# Patient Record
Sex: Male | Born: 1945 | Race: White | Hispanic: No | Marital: Married | Smoking: Former smoker
Health system: Southern US, Community
[De-identification: ages and names within clinical notes are randomized; demographics above are authoritative.]

## PROBLEM LIST (undated history)

## (undated) DIAGNOSIS — I209 Angina pectoris, unspecified: Secondary | ICD-10-CM

## (undated) DIAGNOSIS — K219 Gastro-esophageal reflux disease without esophagitis: Secondary | ICD-10-CM

## (undated) DIAGNOSIS — N185 Chronic kidney disease, stage 5: Secondary | ICD-10-CM

## (undated) DIAGNOSIS — R519 Headache, unspecified: Secondary | ICD-10-CM

## (undated) DIAGNOSIS — M109 Gout, unspecified: Secondary | ICD-10-CM

## (undated) DIAGNOSIS — G43909 Migraine, unspecified, not intractable, without status migrainosus: Secondary | ICD-10-CM

## (undated) DIAGNOSIS — Z515 Encounter for palliative care: Secondary | ICD-10-CM

## (undated) DIAGNOSIS — N184 Chronic kidney disease, stage 4 (severe): Secondary | ICD-10-CM

## (undated) DIAGNOSIS — N400 Enlarged prostate without lower urinary tract symptoms: Secondary | ICD-10-CM

## (undated) DIAGNOSIS — M1990 Unspecified osteoarthritis, unspecified site: Secondary | ICD-10-CM

## (undated) DIAGNOSIS — I2510 Atherosclerotic heart disease of native coronary artery without angina pectoris: Secondary | ICD-10-CM

## (undated) DIAGNOSIS — E785 Hyperlipidemia, unspecified: Secondary | ICD-10-CM

## (undated) DIAGNOSIS — G309 Alzheimer's disease, unspecified: Secondary | ICD-10-CM

## (undated) DIAGNOSIS — I219 Acute myocardial infarction, unspecified: Secondary | ICD-10-CM

## (undated) DIAGNOSIS — I10 Essential (primary) hypertension: Secondary | ICD-10-CM

## (undated) DIAGNOSIS — F0280 Dementia in other diseases classified elsewhere without behavioral disturbance: Secondary | ICD-10-CM

## (undated) HISTORY — PX: COLONOSCOPY: SHX174

## (undated) HISTORY — PX: CARDIAC CATHETERIZATION: SHX172

## (undated) HISTORY — PX: HERNIA REPAIR: SHX51

## (undated) HISTORY — PX: EYE SURGERY: SHX253

---

## 2004-01-13 ENCOUNTER — Other Ambulatory Visit: Payer: Self-pay

## 2009-10-04 ENCOUNTER — Emergency Department: Payer: Self-pay | Admitting: Emergency Medicine

## 2011-03-08 ENCOUNTER — Emergency Department: Payer: Self-pay | Admitting: *Deleted

## 2013-10-01 DIAGNOSIS — M1990 Unspecified osteoarthritis, unspecified site: Secondary | ICD-10-CM | POA: Insufficient documentation

## 2013-10-01 DIAGNOSIS — R809 Proteinuria, unspecified: Secondary | ICD-10-CM | POA: Insufficient documentation

## 2013-11-21 ENCOUNTER — Ambulatory Visit: Payer: Self-pay | Admitting: Gastroenterology

## 2013-11-24 LAB — PATHOLOGY REPORT

## 2014-07-06 DIAGNOSIS — E785 Hyperlipidemia, unspecified: Secondary | ICD-10-CM | POA: Insufficient documentation

## 2014-07-06 DIAGNOSIS — E782 Mixed hyperlipidemia: Secondary | ICD-10-CM | POA: Insufficient documentation

## 2014-07-22 ENCOUNTER — Ambulatory Visit: Payer: Self-pay | Admitting: Internal Medicine

## 2014-08-21 ENCOUNTER — Ambulatory Visit
Admit: 2014-08-21 | Disposition: A | Discharge status: Home / Self Care | Payer: Self-pay | Attending: Nephrology | Admitting: Nephrology

## 2015-05-31 DIAGNOSIS — I10 Essential (primary) hypertension: Secondary | ICD-10-CM | POA: Diagnosis not present

## 2015-05-31 DIAGNOSIS — M545 Low back pain: Secondary | ICD-10-CM | POA: Diagnosis not present

## 2015-05-31 DIAGNOSIS — E782 Mixed hyperlipidemia: Secondary | ICD-10-CM | POA: Diagnosis not present

## 2015-07-06 DIAGNOSIS — M545 Low back pain: Secondary | ICD-10-CM | POA: Diagnosis not present

## 2015-07-06 DIAGNOSIS — Z125 Encounter for screening for malignant neoplasm of prostate: Secondary | ICD-10-CM | POA: Diagnosis not present

## 2015-07-06 DIAGNOSIS — E782 Mixed hyperlipidemia: Secondary | ICD-10-CM | POA: Diagnosis not present

## 2015-07-13 DIAGNOSIS — I10 Essential (primary) hypertension: Secondary | ICD-10-CM | POA: Diagnosis not present

## 2015-07-13 DIAGNOSIS — Z0000 Encounter for general adult medical examination without abnormal findings: Secondary | ICD-10-CM | POA: Diagnosis not present

## 2015-07-13 DIAGNOSIS — N184 Chronic kidney disease, stage 4 (severe): Secondary | ICD-10-CM | POA: Diagnosis not present

## 2015-11-12 DIAGNOSIS — R801 Persistent proteinuria, unspecified: Secondary | ICD-10-CM | POA: Diagnosis not present

## 2015-11-12 DIAGNOSIS — I10 Essential (primary) hypertension: Secondary | ICD-10-CM | POA: Diagnosis not present

## 2015-11-12 DIAGNOSIS — N184 Chronic kidney disease, stage 4 (severe): Secondary | ICD-10-CM | POA: Diagnosis not present

## 2015-12-31 DIAGNOSIS — L03119 Cellulitis of unspecified part of limb: Secondary | ICD-10-CM | POA: Diagnosis not present

## 2015-12-31 DIAGNOSIS — Z91038 Other insect allergy status: Secondary | ICD-10-CM | POA: Diagnosis not present

## 2016-05-02 DIAGNOSIS — M5430 Sciatica, unspecified side: Secondary | ICD-10-CM | POA: Diagnosis not present

## 2016-05-02 DIAGNOSIS — J40 Bronchitis, not specified as acute or chronic: Secondary | ICD-10-CM | POA: Diagnosis not present

## 2016-05-02 DIAGNOSIS — J4522 Mild intermittent asthma with status asthmaticus: Secondary | ICD-10-CM | POA: Diagnosis not present

## 2016-05-26 DIAGNOSIS — M542 Cervicalgia: Secondary | ICD-10-CM | POA: Diagnosis not present

## 2016-05-26 DIAGNOSIS — I10 Essential (primary) hypertension: Secondary | ICD-10-CM | POA: Insufficient documentation

## 2016-06-09 DIAGNOSIS — I10 Essential (primary) hypertension: Secondary | ICD-10-CM | POA: Diagnosis not present

## 2016-06-09 DIAGNOSIS — M542 Cervicalgia: Secondary | ICD-10-CM | POA: Diagnosis not present

## 2016-07-03 DIAGNOSIS — Z9841 Cataract extraction status, right eye: Secondary | ICD-10-CM | POA: Diagnosis not present

## 2016-07-03 DIAGNOSIS — H15111 Episcleritis periodica fugax, right eye: Secondary | ICD-10-CM | POA: Diagnosis not present

## 2016-07-03 DIAGNOSIS — D3132 Benign neoplasm of left choroid: Secondary | ICD-10-CM | POA: Diagnosis not present

## 2016-07-03 DIAGNOSIS — Z9842 Cataract extraction status, left eye: Secondary | ICD-10-CM | POA: Diagnosis not present

## 2016-07-03 DIAGNOSIS — H35361 Drusen (degenerative) of macula, right eye: Secondary | ICD-10-CM | POA: Diagnosis not present

## 2016-07-11 DIAGNOSIS — Z125 Encounter for screening for malignant neoplasm of prostate: Secondary | ICD-10-CM | POA: Diagnosis not present

## 2016-07-11 DIAGNOSIS — N184 Chronic kidney disease, stage 4 (severe): Secondary | ICD-10-CM | POA: Diagnosis not present

## 2016-07-17 DIAGNOSIS — N184 Chronic kidney disease, stage 4 (severe): Secondary | ICD-10-CM | POA: Diagnosis not present

## 2016-07-17 DIAGNOSIS — Z0000 Encounter for general adult medical examination without abnormal findings: Secondary | ICD-10-CM | POA: Insufficient documentation

## 2017-01-12 DIAGNOSIS — N184 Chronic kidney disease, stage 4 (severe): Secondary | ICD-10-CM | POA: Diagnosis not present

## 2017-01-16 DIAGNOSIS — N184 Chronic kidney disease, stage 4 (severe): Secondary | ICD-10-CM | POA: Diagnosis not present

## 2017-01-16 DIAGNOSIS — Z79899 Other long term (current) drug therapy: Secondary | ICD-10-CM | POA: Diagnosis not present

## 2017-01-16 DIAGNOSIS — E782 Mixed hyperlipidemia: Secondary | ICD-10-CM | POA: Diagnosis not present

## 2017-01-16 DIAGNOSIS — N060 Isolated proteinuria with minor glomerular abnormality: Secondary | ICD-10-CM | POA: Diagnosis not present

## 2017-01-16 DIAGNOSIS — Z125 Encounter for screening for malignant neoplasm of prostate: Secondary | ICD-10-CM | POA: Diagnosis not present

## 2017-03-02 ENCOUNTER — Other Ambulatory Visit: Payer: Self-pay | Admitting: Internal Medicine

## 2017-03-02 DIAGNOSIS — R7989 Other specified abnormal findings of blood chemistry: Secondary | ICD-10-CM | POA: Diagnosis not present

## 2017-03-02 DIAGNOSIS — G9349 Other encephalopathy: Secondary | ICD-10-CM

## 2017-03-02 DIAGNOSIS — E538 Deficiency of other specified B group vitamins: Secondary | ICD-10-CM | POA: Diagnosis not present

## 2017-03-02 DIAGNOSIS — E039 Hypothyroidism, unspecified: Secondary | ICD-10-CM | POA: Diagnosis not present

## 2017-03-09 ENCOUNTER — Ambulatory Visit
Admission: RE | Admit: 2017-03-09 | Discharge: 2017-03-09 | Disposition: A | Discharge status: Home / Self Care | Payer: PPO | Source: Ambulatory Visit | Attending: Internal Medicine | Admitting: Internal Medicine

## 2017-03-09 DIAGNOSIS — G9349 Other encephalopathy: Secondary | ICD-10-CM | POA: Diagnosis not present

## 2017-03-09 DIAGNOSIS — R413 Other amnesia: Secondary | ICD-10-CM | POA: Diagnosis not present

## 2017-03-20 DIAGNOSIS — N184 Chronic kidney disease, stage 4 (severe): Secondary | ICD-10-CM | POA: Diagnosis not present

## 2017-03-23 DIAGNOSIS — N184 Chronic kidney disease, stage 4 (severe): Secondary | ICD-10-CM | POA: Diagnosis not present

## 2017-03-26 ENCOUNTER — Other Ambulatory Visit: Payer: Self-pay | Admitting: Internal Medicine

## 2017-03-26 DIAGNOSIS — N184 Chronic kidney disease, stage 4 (severe): Secondary | ICD-10-CM

## 2017-03-30 ENCOUNTER — Ambulatory Visit
Admission: RE | Admit: 2017-03-30 | Discharge: 2017-03-30 | Disposition: A | Discharge status: Home / Self Care | Payer: PPO | Source: Ambulatory Visit | Attending: Internal Medicine | Admitting: Internal Medicine

## 2017-03-30 DIAGNOSIS — N184 Chronic kidney disease, stage 4 (severe): Secondary | ICD-10-CM | POA: Insufficient documentation

## 2017-03-30 DIAGNOSIS — N281 Cyst of kidney, acquired: Secondary | ICD-10-CM | POA: Diagnosis not present

## 2017-04-23 DIAGNOSIS — N184 Chronic kidney disease, stage 4 (severe): Secondary | ICD-10-CM | POA: Diagnosis not present

## 2017-05-06 ENCOUNTER — Encounter: Payer: Self-pay | Admitting: Emergency Medicine

## 2017-05-06 ENCOUNTER — Observation Stay
Admission: EM | Admit: 2017-05-06 | Discharge: 2017-05-07 | Disposition: A | Discharge status: Home / Self Care | Payer: PPO | Attending: Internal Medicine | Admitting: Internal Medicine

## 2017-05-06 ENCOUNTER — Other Ambulatory Visit: Payer: Self-pay

## 2017-05-06 ENCOUNTER — Emergency Department: Payer: PPO

## 2017-05-06 DIAGNOSIS — R079 Chest pain, unspecified: Secondary | ICD-10-CM | POA: Diagnosis not present

## 2017-05-06 DIAGNOSIS — I129 Hypertensive chronic kidney disease with stage 1 through stage 4 chronic kidney disease, or unspecified chronic kidney disease: Secondary | ICD-10-CM | POA: Insufficient documentation

## 2017-05-06 DIAGNOSIS — I10 Essential (primary) hypertension: Secondary | ICD-10-CM | POA: Diagnosis present

## 2017-05-06 DIAGNOSIS — G309 Alzheimer's disease, unspecified: Secondary | ICD-10-CM | POA: Diagnosis not present

## 2017-05-06 DIAGNOSIS — Z79899 Other long term (current) drug therapy: Secondary | ICD-10-CM | POA: Insufficient documentation

## 2017-05-06 DIAGNOSIS — R748 Abnormal levels of other serum enzymes: Secondary | ICD-10-CM | POA: Insufficient documentation

## 2017-05-06 DIAGNOSIS — N184 Chronic kidney disease, stage 4 (severe): Secondary | ICD-10-CM | POA: Diagnosis not present

## 2017-05-06 DIAGNOSIS — K219 Gastro-esophageal reflux disease without esophagitis: Secondary | ICD-10-CM | POA: Diagnosis present

## 2017-05-06 DIAGNOSIS — N186 End stage renal disease: Secondary | ICD-10-CM | POA: Diagnosis present

## 2017-05-06 DIAGNOSIS — R7989 Other specified abnormal findings of blood chemistry: Secondary | ICD-10-CM | POA: Diagnosis not present

## 2017-05-06 DIAGNOSIS — Z87891 Personal history of nicotine dependence: Secondary | ICD-10-CM | POA: Diagnosis not present

## 2017-05-06 DIAGNOSIS — R072 Precordial pain: Principal | ICD-10-CM | POA: Insufficient documentation

## 2017-05-06 DIAGNOSIS — F0280 Dementia in other diseases classified elsewhere without behavioral disturbance: Secondary | ICD-10-CM | POA: Diagnosis present

## 2017-05-06 DIAGNOSIS — R778 Other specified abnormalities of plasma proteins: Secondary | ICD-10-CM

## 2017-05-06 HISTORY — DX: Essential (primary) hypertension: I10

## 2017-05-06 HISTORY — DX: Alzheimer's disease, unspecified: G30.9

## 2017-05-06 HISTORY — DX: Dementia in other diseases classified elsewhere, unspecified severity, without behavioral disturbance, psychotic disturbance, mood disturbance, and anxiety: F02.80

## 2017-05-06 HISTORY — DX: Chronic kidney disease, stage 4 (severe): N18.4

## 2017-05-06 LAB — BASIC METABOLIC PANEL
ANION GAP: 12 (ref 5–15)
BUN: 28 mg/dL — ABNORMAL HIGH (ref 6–20)
CALCIUM: 9.7 mg/dL (ref 8.9–10.3)
CO2: 23 mmol/L (ref 22–32)
Chloride: 106 mmol/L (ref 101–111)
Creatinine, Ser: 2.06 mg/dL — ABNORMAL HIGH (ref 0.61–1.24)
GFR, EST AFRICAN AMERICAN: 35 mL/min — AB (ref >60)
GFR, EST NON AFRICAN AMERICAN: 30 mL/min — AB (ref >60)
Glucose, Bld: 100 mg/dL — ABNORMAL HIGH (ref 65–99)
POTASSIUM: 4.6 mmol/L (ref 3.5–5.1)
SODIUM: 141 mmol/L (ref 135–145)

## 2017-05-06 LAB — TROPONIN I
TROPONIN I: 0.08 ng/mL — AB (ref <0.03)
TROPONIN I: 0.12 ng/mL — AB (ref <0.03)

## 2017-05-06 LAB — CBC
HCT: 38.9 % — ABNORMAL LOW (ref 40.0–52.0)
Hemoglobin: 13.3 g/dL (ref 13.0–18.0)
MCH: 30.6 pg (ref 26.0–34.0)
MCHC: 34.2 g/dL (ref 32.0–36.0)
MCV: 89.3 fL (ref 80.0–100.0)
PLATELETS: 201 10*3/uL (ref 150–440)
RBC: 4.35 MIL/uL — AB (ref 4.40–5.90)
RDW: 12.8 % (ref 11.5–14.5)
WBC: 6 10*3/uL (ref 3.8–10.6)

## 2017-05-06 MED ORDER — MORPHINE SULFATE (PF) 2 MG/ML IV SOLN
2.0000 mg | Freq: Once | INTRAVENOUS | Status: AC
  Administered 2017-05-06: 2 mg via INTRAVENOUS
  Filled 2017-05-06: qty 1

## 2017-05-06 MED ORDER — ONDANSETRON HCL 4 MG/2ML IJ SOLN
4.0000 mg | Freq: Once | INTRAMUSCULAR | Status: AC
  Administered 2017-05-06: 4 mg via INTRAVENOUS
  Filled 2017-05-06: qty 2

## 2017-05-06 MED ORDER — ASPIRIN 81 MG PO CHEW
324.0000 mg | CHEWABLE_TABLET | Freq: Once | ORAL | Status: AC
  Administered 2017-05-06: 324 mg via ORAL
  Filled 2017-05-06: qty 4

## 2017-05-06 NOTE — H&P (Signed)
[name] [place] NAME: Troy Chavez    MR#:  [id number]  DATE OF BIRTH:  10/27/1945  DATE OF ADMISSION:  05/06/2017  PRIMARY CARE PHYSICIAN: Rusty Aus, MD   REQUESTING/REFERRING PHYSICIAN: Cinda Quest, MD  CHIEF COMPLAINT:   Chief Complaint  Patient presents with  . Chest Pain    HISTORY OF PRESENT ILLNESS:  Troy Chavez  is a 71 y.o. male who presents with an episode of chest pain.  Patient states this started shortly after dinnertime, and initially he thought it was indigestion.  He states that he has had recurrent episodes of indigestion in the past.  The pain initially eased, but then came back much stronger than it ever had in the past.  He came to the ED for evaluation.  Here he was found to have troponin elevated at 0.08, and subsequently at 0.12.  At that point hospitalist were called for admission and further evaluation  PAST MEDICAL HISTORY:   Past Medical History:  Diagnosis Date  . Alzheimer's disease   . CKD (chronic kidney disease), stage IV ([place])   . Hypertension     PAST SURGICAL HISTORY:  History reviewed. No pertinent surgical history.  SOCIAL HISTORY:   Social History   Tobacco Use  . Smoking status: Former Research scientist (life sciences)  . Smokeless tobacco: Never Used  Substance Use Topics  . Alcohol use: Yes    FAMILY HISTORY:  History reviewed. No pertinent family history.  DRUG ALLERGIES:  No Known Allergies  MEDICATIONS AT HOME:   Prior to Admission medications   Medication Sig Start Date End Date Taking? Authorizing Provider  amLODipine (NORVASC) 5 MG tablet Take 5 mg by mouth 2 (two) times daily. 03/25/17  Yes [provider]  atorvastatin (LIPITOR) 10 MG tablet Take 10 mg by mouth daily. 03/02/17  Yes [provider]  galantamine (RAZADYNE) 8 MG tablet Take 8 mg by mouth 2 (two) times daily. 03/02/17  Yes [provider]  pantoprazole (PROTONIX) 40 MG tablet Take 40 mg  by mouth daily. 04/06/17  Yes [provider]  tamsulosin (FLOMAX) 0.4 MG CAPS capsule Take 0.4 mg by mouth daily. 05/06/17  Yes [provider]  zolmitriptan (ZOMIG) 5 MG tablet Take 5 mg by mouth once as needed for migraine. If headache returns, may take a second dose after 2 hours.   Yes [provider]    REVIEW OF SYSTEMS:  Review of Systems  Constitutional: Negative for chills, fever, malaise/fatigue and weight loss.  HENT: Negative for ear pain, hearing loss and tinnitus.   Eyes: Negative for blurred vision, double vision, pain and redness.  Respiratory: Negative for cough, hemoptysis and shortness of breath.   Cardiovascular: Positive for chest pain. Negative for palpitations, orthopnea and leg swelling.  Gastrointestinal: Negative for abdominal pain, constipation, diarrhea, nausea and vomiting.  Genitourinary: Negative for dysuria, frequency and hematuria.  Musculoskeletal: Negative for back pain, joint pain and neck pain.  Skin:       No acne, rash, or lesions  Neurological: Negative for dizziness, tremors, focal weakness and weakness.  Endo/Heme/Allergies: Negative for polydipsia. Does not bruise/bleed easily.  Psychiatric/Behavioral: Negative for depression. The patient is not nervous/anxious and does not have insomnia.      VITAL SIGNS:   Vitals:   05/06/17 2030 05/06/17 2100 05/06/17 2130 05/06/17 2200  BP: (!) 147/95 (!) 148/95 (!) 160/107 (!) 153/86  Pulse: 60 (!) 53 67 (!) 54  Resp: 12 11  14 17  Temp:      TempSrc:      SpO2: 94% 97% 96% 94%  Weight:      Height:       Wt Readings from Last 3 Encounters:  05/06/17 83.9 kg (185 lb)    PHYSICAL EXAMINATION:  Physical Exam  Vitals reviewed. Constitutional: He is oriented to person, place, and time. He appears well-developed and well-nourished. No distress.  HENT:  Head: Normocephalic and atraumatic.  Mouth/Throat: Oropharynx is clear and moist.  Eyes: Conjunctivae and EOM are  normal. Pupils are equal, round, and reactive to light. No scleral icterus.  Neck: Normal range of motion. Neck supple. No JVD present. No thyromegaly present.  Cardiovascular: Normal rate, regular rhythm and intact distal pulses. Exam reveals no gallop and no friction rub.  Murmur heard. Respiratory: Effort normal and breath sounds normal. No respiratory distress. He has no wheezes. He has no rales.  GI: Soft. Bowel sounds are normal. He exhibits no distension. There is no tenderness.  Musculoskeletal: Normal range of motion. He exhibits no edema.  No arthritis, no gout  Lymphadenopathy:    He has no cervical adenopathy.  Neurological: He is alert and oriented to person, place, and time. No cranial nerve deficit.  No dysarthria, no aphasia  Skin: Skin is warm and dry. No rash noted. No erythema.  Psychiatric: He has a normal mood and affect. His behavior is normal. Judgment and thought content normal.    LABORATORY PANEL:   CBC Recent Labs  Lab 05/06/17 1906  WBC 6.0  HGB 13.3  HCT 38.9*  PLT 201   ------------------------------------------------------------------------------------------------------------------  Chemistries  Recent Labs  Lab 05/06/17 1842  NA 141  K 4.6  CL 106  CO2 23  GLUCOSE 100*  BUN 28*  CREATININE 2.06*  CALCIUM 9.7   ------------------------------------------------------------------------------------------------------------------  Cardiac Enzymes Recent Labs  Lab 05/06/17 2004  TROPONINI 0.12*   ------------------------------------------------------------------------------------------------------------------  RADIOLOGY:  Dg Chest 2 View  Result Date: 05/06/2017 CLINICAL DATA:  Chest pain radiating to left arm. EXAM: CHEST  2 VIEW COMPARISON:  None. FINDINGS: The heart size and mediastinal contours are within normal limits. Mild tortuosity of thoracic aorta noted. Both lungs are clear. The visualized skeletal structures are unremarkable.  IMPRESSION: No active cardiopulmonary disease. Electronically Signed   By: Earle Gell M.D.   On: 05/06/2017 19:14    EKG:   Orders placed or performed during the hospital encounter of 05/06/17  . EKG 12-Lead  . EKG 12-Lead  . ED EKG within 10 minutes  . ED EKG within 10 minutes    IMPRESSION AND PLAN:  Principal Problem:   Chest pain -trend cardiac enzymes, will start heparin drip if his enzymes rise significantly, get an echocardiogram and a cardiology consult Active Problems:   CKD (chronic kidney disease), stage IV (HCC) -at baseline, avoid nephrotoxins and monitor   HTN (hypertension) -continue home meds   Alzheimer disease -continue home medications   GERD (gastroesophageal reflux disease) -home dose PPI  All the records are reviewed and case discussed with ED provider. Management plans discussed with the patient and/or family.  DVT PROPHYLAXIS: SubQ heparin  GI PROPHYLAXIS: PPI  ADMISSION STATUS: Observation  CODE STATUS: Full Code Status History    This patient does not have a recorded code status. Please follow your organizational policy for patients in this situation.      TOTAL TIME TAKING CARE OF THIS PATIENT: 40 minutes.   Saphyre Cillo FIELDING 05/06/2017, 10:24 PM  [place]  [phone]  CC: Primary care physician; Rusty Aus, MD  Note:  This document was prepared using Dragon voice recognition software and may include unintentional dictation errors.

## 2017-05-06 NOTE — ED Provider Notes (Addendum)
[facility] Emergency Department Provider Note   ____________________________________________   First MD Initiated Contact with Patient 05/06/17 1844     (approximate)  I have reviewed the triage vital signs and the nursing notes.   HISTORY  Chief Complaint Chest Pain    HPI Troy Chavez is a 71 y.o. male Who comes in with his wife. They report chest pain which is pressure-like in the left upper chest radiating into the back and the top of left arm starting about an hour ago. Nothing he does makes it better or worse she's never had it before he may be a little short of breath is not sure is not coughing any patient is wincing in pain seems to be severe pain   Past Medical History:  Diagnosis Date  . Alzheimer's disease   . Hypertension   . Renal disorder     There are no active problems to display for this patient.   History reviewed. No pertinent surgical history.  Prior to Admission medications   Not on File    Allergies Patient has no known allergies.  History reviewed. No pertinent family history.  Social History Social History   Tobacco Use  . Smoking status: Former Research scientist (life sciences)  . Smokeless tobacco: Never Used  Substance Use Topics  . Alcohol use: Yes  . Drug use: No    Review of Systems  Constitutional: No fever/chills Eyes: No visual changes. ENT: No sore throat. Cardiovascular:  chest pain. Respiratory: see history of present illness is no coughing Gastrointestinal: No abdominal pain.  No nausea, no vomiting.  No diarrhea.  No constipation. Genitourinary: Negative for dysuria. Musculoskeletal: Negative for back pain. Skin: Negative for rash. Neurological: Negative for headaches, focal weakness  ____________________________________________   PHYSICAL EXAM:  VITAL SIGNS: ED Triage Vitals [05/06/17 1831]  Enc Vitals Group     BP (!) 180/116     Pulse Rate 69     Resp (!) 26     Temp 97.7 F (36.5 C)     Temp  Source Oral     SpO2 100 %     Weight 185 lb (83.9 kg)     Height 5\' 9"  (1.753 m)     Head Circumference      Peak Flow      Pain Score 10     Pain Loc      Pain Edu?      Excl. in [place]?     Constitutional: Alert and oriented. Well appearing and wincing in pain Eyes: Conjunctivae are normal.  Head: Atraumatic. Nose: No congestion/rhinnorhea. Mouth/Throat: Mucous membranes are moist.  Oropharynx non-erythematous. Neck: No stridor.   Cardiovascular: Normal rate, regular rhythm. Grossly normal heart sounds.  Good peripheral circulation.pulses in both wrists are equal bilaterally Respiratory: Normal respiratory effort.  No retractions. Lungs CTAB. Gastrointestinal: Soft and nontender. No distention. No abdominal bruits. No CVA tenderness. Musculoskeletal: No lower extremity tenderness nor edema.  No joint effusions. Neurologic:  Normal speech and language. No gross focal neurologic deficits are appreciated.  Skin:  Skin is warm, dry and intact. No rash noted. Psychiatric: Mood and affect are normal. Speech and behavior are normal.  ____________________________________________   LABS (all labs ordered are listed, but only abnormal results are displayed)  Labs Reviewed  BASIC METABOLIC PANEL - Abnormal; Notable for the following components:      Result Value   Glucose, Bld 100 (*)    BUN 28 (*)    Creatinine, Ser  2.06 (*)    GFR calc non Af Amer 30 (*)    GFR calc Af Amer 35 (*)    All other components within normal limits  TROPONIN I - Abnormal; Notable for the following components:   Troponin I 0.08 (*)    All other components within normal limits  CBC - Abnormal; Notable for the following components:   RBC 4.35 (*)    HCT 38.9 (*)    All other components within normal limits  TROPONIN I - Abnormal; Notable for the following components:   Troponin I 0.12 (*)    All other components within normal limits   ____________________________________________  EKG  EKG read and  interpreted by me shows normal sinus rhythm with PVCs normal axis no acute ST-T wave changes are seen ____________________________________________  RADIOLOGY   ____________________________________________   PROCEDURES  Procedure(s) performed:   Procedures  Critical Care performed:  ____________________________________________   INITIAL IMPRESSION / ASSESSMENT AND PLAN / ED COURSE second troponin is pending. Patient is pain-free at present. She is not sent patient had intermittent shortness of breath he told me he did not. he confirmed this again at 845. His second troponin has come back higher than the first however he is now chest pain-free. Pain resolved before the aspirin. I'll plan on admitting him. Discuss heparin with the hospitalist.she is awake and alert breathing normally and his oxygen level is 95% this point.       ____________________________________________   FINAL CLINICAL IMPRESSION(S) / ED DIAGNOSES  Final diagnoses:  Precordial pain  Elevated troponin     ED Discharge Orders    None       Note:  This document was prepared using Dragon voice recognition software and may include unintentional dictation errors.    Nena Polio, MD 05/06/17 2050    Nena Polio, MD 05/06/17 2104    Nena Polio, MD 05/06/17 [phone]

## 2017-05-06 NOTE — ED Notes (Signed)
Pt given banana and ginger ale per request.

## 2017-05-06 NOTE — ED Notes (Signed)
Pt has left side chest pain for 30 minutes.  intermittnet sob.  No cough  Nonsmoker.   pt reports back pain.   Pt tearful

## 2017-05-06 NOTE — ED Notes (Signed)
Pt continues to wait for admisssion. Pt alert.  No chest pain.  Family with pt.  nsr on monitor.

## 2017-05-06 NOTE — ED Notes (Signed)
Report off to vanessa rn 

## 2017-05-06 NOTE — ED Notes (Signed)
Pt waiting on admission.

## 2017-05-06 NOTE — ED Notes (Signed)
Blood redrawn from right [facility]

## 2017-05-06 NOTE — ED Triage Notes (Signed)
Pt reports central chest pain radiating to left arm and back. Pt denies any other associated symptoms. Pt hyperventilating and crying in triage.

## 2017-05-06 NOTE — ED Notes (Signed)
Patient transported to X-ray 

## 2017-05-06 NOTE — ED Notes (Signed)
primedoc in with pt now 

## 2017-05-07 DIAGNOSIS — I10 Essential (primary) hypertension: Secondary | ICD-10-CM | POA: Diagnosis not present

## 2017-05-07 DIAGNOSIS — G309 Alzheimer's disease, unspecified: Secondary | ICD-10-CM | POA: Diagnosis not present

## 2017-05-07 DIAGNOSIS — N184 Chronic kidney disease, stage 4 (severe): Secondary | ICD-10-CM | POA: Diagnosis not present

## 2017-05-07 DIAGNOSIS — R079 Chest pain, unspecified: Secondary | ICD-10-CM | POA: Diagnosis not present

## 2017-05-07 LAB — CBC
HCT: 36.5 % — ABNORMAL LOW (ref 40.0–52.0)
Hemoglobin: 12.4 g/dL — ABNORMAL LOW (ref 13.0–18.0)
MCH: 30.5 pg (ref 26.0–34.0)
MCHC: 34 g/dL (ref 32.0–36.0)
MCV: 89.7 fL (ref 80.0–100.0)
PLATELETS: 187 10*3/uL (ref 150–440)
RBC: 4.07 MIL/uL — ABNORMAL LOW (ref 4.40–5.90)
RDW: 13.1 % (ref 11.5–14.5)
WBC: 6.4 10*3/uL (ref 3.8–10.6)

## 2017-05-07 LAB — BASIC METABOLIC PANEL
Anion gap: 6 (ref 5–15)
BUN: 27 mg/dL — AB (ref 6–20)
CALCIUM: 9 mg/dL (ref 8.9–10.3)
CHLORIDE: 106 mmol/L (ref 101–111)
CO2: 29 mmol/L (ref 22–32)
CREATININE: 1.97 mg/dL — AB (ref 0.61–1.24)
GFR calc Af Amer: 37 mL/min — ABNORMAL LOW (ref >60)
GFR calc non Af Amer: 32 mL/min — ABNORMAL LOW (ref >60)
Glucose, Bld: 107 mg/dL — ABNORMAL HIGH (ref 65–99)
Potassium: 3.9 mmol/L (ref 3.5–5.1)
SODIUM: 141 mmol/L (ref 135–145)

## 2017-05-07 LAB — PROTIME-INR
INR: 1.12
PROTHROMBIN TIME: 14.3 s (ref 11.4–15.2)

## 2017-05-07 LAB — TROPONIN I
Troponin I: 0.28 ng/mL (ref <0.03)
Troponin I: 0.22 ng/mL (ref <0.03)

## 2017-05-07 LAB — APTT: aPTT: 36 seconds (ref 24–36)

## 2017-05-07 MED ORDER — ACETAMINOPHEN 650 MG RE SUPP
650.0000 mg | Freq: Four times a day (QID) | RECTAL | Status: DC | PRN
Start: 2017-05-07 — End: 2017-05-07

## 2017-05-07 MED ORDER — GALANTAMINE HYDROBROMIDE 4 MG PO TABS
8.0000 mg | ORAL_TABLET | Freq: Two times a day (BID) | ORAL | Status: DC
  Administered 2017-05-07: 8 mg via ORAL
  Filled 2017-05-07 (×2): qty 2

## 2017-05-07 MED ORDER — SODIUM CHLORIDE 0.9 % IV SOLN
INTRAVENOUS | Status: AC
  Administered 2017-05-07: 01:00:00 via INTRAVENOUS

## 2017-05-07 MED ORDER — ONDANSETRON HCL 4 MG PO TABS: 4.0000 mg | ORAL_TABLET | Freq: Four times a day (QID) | ORAL | Status: DC | PRN

## 2017-05-07 MED ORDER — PANTOPRAZOLE SODIUM 40 MG PO TBEC
40.0000 mg | DELAYED_RELEASE_TABLET | Freq: Every day | ORAL | Status: DC
  Administered 2017-05-07: 40 mg via ORAL
  Filled 2017-05-07: qty 1

## 2017-05-07 MED ORDER — HEPARIN BOLUS VIA INFUSION
4000.0000 [IU] | Freq: Once | INTRAVENOUS | Status: AC
  Administered 2017-05-07: 4000 [IU] via INTRAVENOUS
  Filled 2017-05-07: qty 4000

## 2017-05-07 MED ORDER — ACETAMINOPHEN 325 MG PO TABS: 650.0000 mg | ORAL_TABLET | Freq: Four times a day (QID) | ORAL | Status: DC | PRN

## 2017-05-07 MED ORDER — PANTOPRAZOLE SODIUM 40 MG PO TBEC: 40.0000 mg | DELAYED_RELEASE_TABLET | Freq: Every day | ORAL | 0 refills | Status: AC

## 2017-05-07 MED ORDER — HEPARIN (PORCINE) IN NACL 100-0.45 UNIT/ML-% IJ SOLN
1000.0000 [IU]/h | INTRAMUSCULAR | Status: DC
  Administered 2017-05-07: 1000 [IU]/h via INTRAVENOUS
  Filled 2017-05-07: qty 250

## 2017-05-07 MED ORDER — ONDANSETRON HCL 4 MG/2ML IJ SOLN: 4.0000 mg | Freq: Four times a day (QID) | INTRAMUSCULAR | Status: DC | PRN

## 2017-05-07 MED ORDER — HEPARIN SODIUM (PORCINE) 5000 UNIT/ML IJ SOLN: 5000.0000 [IU] | Freq: Three times a day (TID) | INTRAMUSCULAR | Status: DC

## 2017-05-07 MED ORDER — TAMSULOSIN HCL 0.4 MG PO CAPS
0.4000 mg | ORAL_CAPSULE | Freq: Every day | ORAL | Status: DC
  Administered 2017-05-07: 0.4 mg via ORAL
  Filled 2017-05-07: qty 1

## 2017-05-07 MED ORDER — AMLODIPINE BESYLATE 5 MG PO TABS
5.0000 mg | ORAL_TABLET | Freq: Two times a day (BID) | ORAL | Status: DC
  Administered 2017-05-07 (×2): 5 mg via ORAL
  Filled 2017-05-07 (×2): qty 1

## 2017-05-07 MED ORDER — ATORVASTATIN CALCIUM 10 MG PO TABS
10.0000 mg | ORAL_TABLET | Freq: Every day | ORAL | Status: DC
  Administered 2017-05-07: 10 mg via ORAL
  Filled 2017-05-07: qty 1

## 2017-05-07 NOTE — Care Management Obs Status (Signed)
[place] NOTIFICATION   Patient Details  Name: Troy Chavez MRN: [id number] Date of Birth: August 06, 1945   Medicare Observation Status Notification Given:  No Discharge order placed in < 24hr of being placed on observation    Katrina Stack, RN 05/07/2017, 4:43 PM

## 2017-05-07 NOTE — Discharge Instructions (Signed)

## 2017-05-07 NOTE — Progress Notes (Signed)
Discharge instructions explained to pt/ verbalized an understanding/ iv and tele removed/ will transport off unit via wheelchair.  

## 2017-05-07 NOTE — Progress Notes (Signed)
Multiple attempts by multiple nurses to start a second IV which were all unsuccessful. Pt. Refuses at this time to let anyone else attempt to start another IV. Will inform day nurse.

## 2017-05-07 NOTE — Progress Notes (Signed)
Troponin increased to 0.28, Dr. Marcille Blanco informed, new order see Emar.

## 2017-05-07 NOTE — Progress Notes (Signed)
ANTICOAGULATION CONSULT NOTE - Initial Consult  Pharmacy Consult for heparin drip Indication: chest pain/ACS/elevated troponin  No Known Allergies  Patient Measurements: Height: 5\' 9"  (175.3 cm) Weight: 183 lb 12.8 oz (83.4 kg) IBW/kg (Calculated) : 70.7 Heparin Dosing Weight: 83kg  Vital Signs: Temp: 97.6 F (36.4 C) (12/31 0513) Temp Source: Oral (12/31 0513) BP: 110/68 (12/31 0513) Pulse Rate: 68 (12/31 0513)  Labs: Recent Labs    05/06/17 1842 05/06/17 1906 05/06/17 2004 05/07/17 0150 05/07/17 0400  HGB  --  13.3  --  12.4*  --   HCT  --  38.9*  --  36.5*  --   PLT  --  201  --  187  --   APTT  --   --   --   --  36  LABPROT  --   --   --   --  14.3  INR  --   --   --   --  1.12  CREATININE 2.06*  --   --  1.97*  --   TROPONINI 0.08*  --  0.12* 0.28*  --     Estimated Creatinine Clearance: 33.4 mL/min (A) (by C-G formula based on SCr of 1.97 mg/dL (H)).   Medical History: Past Medical History:  Diagnosis Date  . Alzheimer's disease   . CKD (chronic kidney disease), stage IV ([place])   . Hypertension     Medications:  No anticoagulation in PTA meds  Assessment: Troponin 0.28  Goal of Therapy:  Heparin level 0.3-0.7 units/ml Monitor platelets by anticoagulation protocol: Yes   Plan:  4000 unit bolus and initial rate of 1000 units/hr. First heparin level 8 hours after start of infusion.  Lainie Daubert S 05/07/2017,5:35 AM

## 2017-05-08 NOTE — Discharge Summary (Signed)
[facility] at [place] NAME: Troy Chavez    MR#:  [id number]  DATE OF BIRTH:  1945/08/10  DATE OF ADMISSION:  05/06/2017   ADMITTING PHYSICIAN: Lance Coon, MD  DATE OF DISCHARGE: 05/07/2017  3:20 PM  PRIMARY CARE PHYSICIAN: Rusty Aus, MD   ADMISSION DIAGNOSIS:  Precordial pain [R07.2] Elevated troponin [R74.8] DISCHARGE DIAGNOSIS:  Principal Problem:   Chest pain Active Problems:   CKD (chronic kidney disease), stage IV (HCC)   HTN (hypertension)   Alzheimer disease   GERD (gastroesophageal reflux disease)  SECONDARY DIAGNOSIS:   Past Medical History:  Diagnosis Date  . Alzheimer's disease   . CKD (chronic kidney disease), stage IV ([place])   . Hypertension    HOSPITAL COURSE:  72 y.o. male admitted with an episode of chest pain.    * chest pain: likely noncardiac, could be due to GERD. Troponin peaked 0.28 - Patient states this started shortly after dinnertime, and initially he thought it was indigestion.  He states that he has had recurrent episodes of indigestion in the past.  The pain initially eased, but then came back much stronger than it ever had in the past.  He came to the ED for evaluation.  Here he was found to have troponin elevated at 0.08-> 0.12->0.28->0.22.  He remained chest pain free in the hospital and was [facility] home in stable condition. DISCHARGE CONDITIONS:  stable CONSULTS OBTAINED:  Treatment Team:  Yolonda Kida, MD DRUG ALLERGIES:  No Known Allergies DISCHARGE MEDICATIONS:   Allergies as of 05/07/2017   No Known Allergies     Medication List    TAKE these medications   amLODipine 5 MG tablet Commonly known as:  NORVASC Take 5 mg by mouth 2 (two) times daily.   atorvastatin 10 MG tablet Commonly known as:  LIPITOR Take 10 mg by mouth daily.   galantamine 8 MG tablet Commonly known as:  RAZADYNE Take 8 mg by mouth 2 (two) times daily.   pantoprazole 40 MG tablet Commonly  known as:  PROTONIX Take 1 tablet (40 mg total) by mouth daily.   tamsulosin 0.4 MG Caps capsule Commonly known as:  FLOMAX Take 0.4 mg by mouth daily.   zolmitriptan 5 MG tablet Commonly known as:  ZOMIG Take 5 mg by mouth once as needed for migraine. If headache returns, may take a second dose after 2 hours.        DISCHARGE INSTRUCTIONS:   DIET:  Cardiac diet DISCHARGE CONDITION:  Good ACTIVITY:  Activity as tolerated OXYGEN:  Home Oxygen: No.  Oxygen Delivery: room air DISCHARGE LOCATION:  home   If you experience worsening of your admission symptoms, develop shortness of breath, life threatening emergency, suicidal or homicidal thoughts you must seek medical attention immediately by calling 911 or calling your MD immediately  if symptoms less severe.  You Must read complete instructions/literature along with all the possible adverse reactions/side effects for all the Medicines you take and that have been prescribed to you. Take any new Medicines after you have completely understood and accpet all the possible adverse reactions/side effects.   Please note  You were cared for by a hospitalist during your hospital stay. If you have any questions about your discharge medications or the care you received while you were in the hospital after you are discharged, you can call the unit and asked to speak with the hospitalist on call if the hospitalist that took care  of you is not available. Once you are discharged, your primary care physician will handle any further medical issues. Please note that NO REFILLS for any discharge medications will be authorized once you are discharged, as it is imperative that you return to your primary care physician (or establish a relationship with a primary care physician if you do not have one) for your aftercare needs so that they can reassess your need for medications and monitor your lab values.    On the day of Discharge:  VITAL SIGNS:    Blood pressure 133/81, pulse (!) 58, temperature 97.6 F (36.4 C), temperature source Oral, resp. rate 18, height 5\' 9"  (1.753 m), weight 83.4 kg (183 lb 12.8 oz), SpO2 99 %. PHYSICAL EXAMINATION:  GENERAL:  72 y.o.-year-old patient lying in the bed with no acute distress.  EYES: Pupils equal, round, reactive to light and accommodation. No scleral icterus. Extraocular muscles intact.  HEENT: Head atraumatic, normocephalic. Oropharynx and nasopharynx clear.  NECK:  Supple, no jugular venous distention. No thyroid enlargement, no tenderness.  LUNGS: Normal breath sounds bilaterally, no wheezing, rales,rhonchi or crepitation. No use of accessory muscles of respiration.  CARDIOVASCULAR: S1, S2 normal. No murmurs, rubs, or gallops.  ABDOMEN: Soft, non-tender, non-distended. Bowel sounds present. No organomegaly or mass.  EXTREMITIES: No pedal edema, cyanosis, or clubbing.  NEUROLOGIC: Cranial nerves II through XII are intact. Muscle strength 5/5 in all extremities. Sensation intact. Gait not checked.  PSYCHIATRIC: The patient is alert and oriented x 3.  SKIN: No obvious rash, lesion, or ulcer.  DATA REVIEW:   CBC Recent Labs  Lab 05/07/17 0150  WBC 6.4  HGB 12.4*  HCT 36.5*  PLT 187    Chemistries  Recent Labs  Lab 05/07/17 0150  NA 141  K 3.9  CL 106  CO2 29  GLUCOSE 107*  BUN 27*  CREATININE 1.97*  CALCIUM 9.0     Follow-up Information    Rusty Aus, MD. Go on 05/15/2017.   Specialty:  Internal Medicine Why:  @2 :45PM Contact information: [place] [address] [phone]        Yolonda Kida, MD. Go on 05/21/2017.   Specialties:  Cardiology, Internal Medicine Why:  @2 :00PM Contact information: [place] [address] [phone]           Management plans discussed with the patient, family and they are in agreement.  CODE STATUS:  Prior   TOTAL TIME TAKING CARE OF THIS PATIENT: 45 minutes.    Max Sane M.D on 05/08/2017 at 6:50 PM  Between 7am to 6pm - Pager - [phone]  After 6pm go to [email or web address] - Proofreader  [facility]  Office  [phone]  CC: Primary care physician; Rusty Aus, MD   Note: This dictation was prepared with Dragon dictation along with smaller phrase technology. Any transcriptional errors that result from this process are unintentional.

## 2017-05-15 DIAGNOSIS — R0789 Other chest pain: Secondary | ICD-10-CM | POA: Diagnosis not present

## 2017-05-15 DIAGNOSIS — K219 Gastro-esophageal reflux disease without esophagitis: Secondary | ICD-10-CM | POA: Diagnosis not present

## 2017-05-17 DIAGNOSIS — R0789 Other chest pain: Secondary | ICD-10-CM | POA: Diagnosis not present

## 2017-05-17 DIAGNOSIS — M940 Chondrocostal junction syndrome [Tietze]: Secondary | ICD-10-CM | POA: Diagnosis not present

## 2017-05-28 ENCOUNTER — Inpatient Hospital Stay
Admission: EM | Admit: 2017-05-28 | Discharge: 2017-05-30 | DRG: 247 | Disposition: A | Discharge status: Home / Self Care | Payer: PPO | Attending: Internal Medicine | Admitting: Internal Medicine

## 2017-05-28 ENCOUNTER — Encounter: Payer: Self-pay | Admitting: Emergency Medicine

## 2017-05-28 ENCOUNTER — Other Ambulatory Visit: Payer: Self-pay

## 2017-05-28 ENCOUNTER — Encounter
Admission: EM | Disposition: A | Discharge status: Home / Self Care | Payer: Self-pay | Source: Home / Self Care | Attending: Internal Medicine

## 2017-05-28 ENCOUNTER — Emergency Department: Payer: PPO

## 2017-05-28 DIAGNOSIS — I2129 ST elevation (STEMI) myocardial infarction involving other sites: Principal | ICD-10-CM | POA: Diagnosis present

## 2017-05-28 DIAGNOSIS — K219 Gastro-esophageal reflux disease without esophagitis: Secondary | ICD-10-CM | POA: Diagnosis present

## 2017-05-28 DIAGNOSIS — I252 Old myocardial infarction: Secondary | ICD-10-CM | POA: Diagnosis present

## 2017-05-28 DIAGNOSIS — Z955 Presence of coronary angioplasty implant and graft: Secondary | ICD-10-CM | POA: Diagnosis not present

## 2017-05-28 DIAGNOSIS — I213 ST elevation (STEMI) myocardial infarction of unspecified site: Secondary | ICD-10-CM

## 2017-05-28 DIAGNOSIS — I2510 Atherosclerotic heart disease of native coronary artery without angina pectoris: Secondary | ICD-10-CM

## 2017-05-28 DIAGNOSIS — I208 Other forms of angina pectoris: Secondary | ICD-10-CM | POA: Diagnosis not present

## 2017-05-28 DIAGNOSIS — G309 Alzheimer's disease, unspecified: Secondary | ICD-10-CM | POA: Diagnosis not present

## 2017-05-28 DIAGNOSIS — E785 Hyperlipidemia, unspecified: Secondary | ICD-10-CM | POA: Diagnosis not present

## 2017-05-28 DIAGNOSIS — I10 Essential (primary) hypertension: Secondary | ICD-10-CM | POA: Diagnosis not present

## 2017-05-28 DIAGNOSIS — N400 Enlarged prostate without lower urinary tract symptoms: Secondary | ICD-10-CM | POA: Diagnosis not present

## 2017-05-28 DIAGNOSIS — I255 Ischemic cardiomyopathy: Secondary | ICD-10-CM | POA: Diagnosis not present

## 2017-05-28 DIAGNOSIS — Z79899 Other long term (current) drug therapy: Secondary | ICD-10-CM

## 2017-05-28 DIAGNOSIS — F0280 Dementia in other diseases classified elsewhere without behavioral disturbance: Secondary | ICD-10-CM | POA: Diagnosis present

## 2017-05-28 DIAGNOSIS — R072 Precordial pain: Secondary | ICD-10-CM | POA: Diagnosis not present

## 2017-05-28 DIAGNOSIS — I129 Hypertensive chronic kidney disease with stage 1 through stage 4 chronic kidney disease, or unspecified chronic kidney disease: Secondary | ICD-10-CM | POA: Diagnosis not present

## 2017-05-28 DIAGNOSIS — N184 Chronic kidney disease, stage 4 (severe): Secondary | ICD-10-CM | POA: Diagnosis present

## 2017-05-28 DIAGNOSIS — Z87891 Personal history of nicotine dependence: Secondary | ICD-10-CM | POA: Diagnosis not present

## 2017-05-28 DIAGNOSIS — I5030 Unspecified diastolic (congestive) heart failure: Secondary | ICD-10-CM | POA: Diagnosis not present

## 2017-05-28 HISTORY — DX: ST elevation (STEMI) myocardial infarction involving other sites: I21.29

## 2017-05-28 HISTORY — PX: CORONARY/GRAFT ACUTE MI REVASCULARIZATION: CATH118305

## 2017-05-28 HISTORY — PX: LEFT HEART CATH AND CORONARY ANGIOGRAPHY: CATH118249

## 2017-05-28 HISTORY — DX: Atherosclerotic heart disease of native coronary artery without angina pectoris: I25.10

## 2017-05-28 LAB — TROPONIN I
Troponin I: 0.36 ng/mL (ref <0.03)
Troponin I: 3.71 ng/mL (ref <0.03)
Troponin I: 21.07 ng/mL (ref <0.03)

## 2017-05-28 LAB — APTT: APTT: 33 s (ref 24–36)

## 2017-05-28 LAB — COMPREHENSIVE METABOLIC PANEL
ALBUMIN: 4.8 g/dL (ref 3.5–5.0)
ALK PHOS: 85 U/L (ref 38–126)
ALT: 18 U/L (ref 17–63)
AST: 28 U/L (ref 15–41)
Anion gap: 15 (ref 5–15)
BILIRUBIN TOTAL: 0.8 mg/dL (ref 0.3–1.2)
BUN: 26 mg/dL — AB (ref 6–20)
CALCIUM: 9.8 mg/dL (ref 8.9–10.3)
CO2: 21 mmol/L — AB (ref 22–32)
CREATININE: 2.31 mg/dL — AB (ref 0.61–1.24)
Chloride: 102 mmol/L (ref 101–111)
GFR calc Af Amer: 31 mL/min — ABNORMAL LOW (ref >60)
GFR calc non Af Amer: 26 mL/min — ABNORMAL LOW (ref >60)
GLUCOSE: 103 mg/dL — AB (ref 65–99)
Potassium: 3.6 mmol/L (ref 3.5–5.1)
SODIUM: 138 mmol/L (ref 135–145)
TOTAL PROTEIN: 8.6 g/dL — AB (ref 6.5–8.1)

## 2017-05-28 LAB — POCT ACTIVATED CLOTTING TIME
Activated Clotting Time: 224 seconds
Activated Clotting Time: 235 seconds

## 2017-05-28 LAB — CBC WITH DIFFERENTIAL/PLATELET
BASOS PCT: 1 %
Basophils Absolute: 0.1 10*3/uL (ref 0–0.1)
EOS ABS: 0.3 10*3/uL (ref 0–0.7)
EOS PCT: 2 %
HCT: 46.1 % (ref 40.0–52.0)
HEMOGLOBIN: 15.2 g/dL (ref 13.0–18.0)
LYMPHS ABS: 2.1 10*3/uL (ref 1.0–3.6)
Lymphocytes Relative: 18 %
MCH: 29.5 pg (ref 26.0–34.0)
MCHC: 33.1 g/dL (ref 32.0–36.0)
MCV: 89.3 fL (ref 80.0–100.0)
MONO ABS: 1.3 10*3/uL — AB (ref 0.2–1.0)
MONOS PCT: 12 %
NEUTROS PCT: 67 %
Neutro Abs: 7.8 10*3/uL — ABNORMAL HIGH (ref 1.4–6.5)
Platelets: 264 10*3/uL (ref 150–440)
RBC: 5.16 MIL/uL (ref 4.40–5.90)
RDW: 13.5 % (ref 11.5–14.5)
WBC: 11.5 10*3/uL — ABNORMAL HIGH (ref 3.8–10.6)

## 2017-05-28 LAB — MRSA PCR SCREENING: MRSA by PCR: NEGATIVE

## 2017-05-28 LAB — GLUCOSE, CAPILLARY: GLUCOSE-CAPILLARY: 120 mg/dL — AB (ref 65–99)

## 2017-05-28 SURGERY — CORONARY/GRAFT ACUTE MI REVASCULARIZATION
Anesthesia: Moderate Sedation

## 2017-05-28 MED ORDER — VERAPAMIL HCL 2.5 MG/ML IV SOLN
INTRAVENOUS | Status: AC
  Filled 2017-05-28: qty 2

## 2017-05-28 MED ORDER — SODIUM CHLORIDE 0.9 % IV SOLN: 250.0000 mL | INTRAVENOUS | Status: DC | PRN

## 2017-05-28 MED ORDER — SODIUM CHLORIDE 0.9% FLUSH
3.0000 mL | Freq: Two times a day (BID) | INTRAVENOUS | Status: DC
  Administered 2017-05-28 – 2017-05-30 (×3): 3 mL via INTRAVENOUS

## 2017-05-28 MED ORDER — ASPIRIN 81 MG PO CHEW
324.0000 mg | CHEWABLE_TABLET | Freq: Once | ORAL | Status: AC
  Administered 2017-05-28: 324 mg via ORAL

## 2017-05-28 MED ORDER — SODIUM CHLORIDE 0.9% FLUSH
3.0000 mL | Freq: Two times a day (BID) | INTRAVENOUS | Status: DC
  Administered 2017-05-28 – 2017-05-30 (×5): 3 mL via INTRAVENOUS

## 2017-05-28 MED ORDER — TAMSULOSIN HCL 0.4 MG PO CAPS
0.4000 mg | ORAL_CAPSULE | Freq: Every day | ORAL | Status: DC
Start: 2017-05-28 — End: 2017-05-30
  Administered 2017-05-29 – 2017-05-30 (×2): 0.4 mg via ORAL
  Filled 2017-05-28 (×2): qty 1

## 2017-05-28 MED ORDER — ACETAMINOPHEN 650 MG RE SUPP: 650.0000 mg | Freq: Four times a day (QID) | RECTAL | Status: DC | PRN

## 2017-05-28 MED ORDER — HEPARIN (PORCINE) IN NACL 2-0.9 UNIT/ML-% IJ SOLN
INTRAMUSCULAR | Status: AC
  Filled 2017-05-28: qty 500

## 2017-05-28 MED ORDER — ONDANSETRON HCL 4 MG PO TABS: 4.0000 mg | ORAL_TABLET | Freq: Four times a day (QID) | ORAL | Status: DC | PRN

## 2017-05-28 MED ORDER — TICAGRELOR 90 MG PO TABS
ORAL_TABLET | ORAL | Status: DC | PRN
  Administered 2017-05-28: 180 mg via ORAL

## 2017-05-28 MED ORDER — SODIUM CHLORIDE 0.9% FLUSH: 3.0000 mL | INTRAVENOUS | Status: DC | PRN

## 2017-05-28 MED ORDER — ACETAMINOPHEN 325 MG PO TABS
650.0000 mg | ORAL_TABLET | ORAL | Status: DC | PRN
Start: 2017-05-28 — End: 2017-05-28

## 2017-05-28 MED ORDER — SODIUM CHLORIDE 0.9 % IV SOLN
INTRAVENOUS | Status: AC
  Administered 2017-05-28 (×2): via INTRAVENOUS

## 2017-05-28 MED ORDER — ONDANSETRON HCL 4 MG/2ML IJ SOLN: 4.0000 mg | Freq: Four times a day (QID) | INTRAMUSCULAR | Status: DC | PRN

## 2017-05-28 MED ORDER — SENNA 8.6 MG PO TABS
1.0000 | ORAL_TABLET | Freq: Two times a day (BID) | ORAL | Status: DC
  Administered 2017-05-28 – 2017-05-30 (×3): 8.6 mg via ORAL
  Filled 2017-05-28 (×3): qty 1

## 2017-05-28 MED ORDER — TICAGRELOR 90 MG PO TABS
ORAL_TABLET | ORAL | Status: AC
  Filled 2017-05-28: qty 2

## 2017-05-28 MED ORDER — ACETAMINOPHEN 325 MG PO TABS
650.0000 mg | ORAL_TABLET | ORAL | Status: DC | PRN
  Administered 2017-05-29 (×3): 325 mg via ORAL
  Filled 2017-05-28 (×3): qty 2

## 2017-05-28 MED ORDER — NITROGLYCERIN 0.4 MG SL SUBL
0.4000 mg | SUBLINGUAL_TABLET | SUBLINGUAL | Status: DC | PRN
  Administered 2017-05-28: 0.4 mg via SUBLINGUAL

## 2017-05-28 MED ORDER — ATORVASTATIN CALCIUM 20 MG PO TABS: 40.0000 mg | ORAL_TABLET | Freq: Every day | ORAL | Status: DC

## 2017-05-28 MED ORDER — IOPAMIDOL (ISOVUE-300) INJECTION 61%
INTRAVENOUS | Status: DC | PRN
  Administered 2017-05-28: 110 mL via INTRA_ARTERIAL

## 2017-05-28 MED ORDER — ACETAMINOPHEN 325 MG PO TABS: 650.0000 mg | ORAL_TABLET | Freq: Four times a day (QID) | ORAL | Status: DC | PRN

## 2017-05-28 MED ORDER — VERAPAMIL HCL 2.5 MG/ML IV SOLN
INTRAVENOUS | Status: DC | PRN
  Administered 2017-05-28: 2.5 mg via INTRA_ARTERIAL

## 2017-05-28 MED ORDER — MIDAZOLAM HCL 2 MG/2ML IJ SOLN
INTRAMUSCULAR | Status: AC
Start: 2017-05-28 — End: ?
  Filled 2017-05-28: qty 2

## 2017-05-28 MED ORDER — MIDAZOLAM HCL 2 MG/2ML IJ SOLN
INTRAMUSCULAR | Status: DC | PRN
  Administered 2017-05-28: 1 mg via INTRAVENOUS

## 2017-05-28 MED ORDER — ENOXAPARIN SODIUM 30 MG/0.3ML ~~LOC~~ SOLN
30.0000 mg | SUBCUTANEOUS | Status: DC
  Administered 2017-05-29 – 2017-05-30 (×2): 30 mg via SUBCUTANEOUS
  Filled 2017-05-28 (×2): qty 0.3

## 2017-05-28 MED ORDER — NITROGLYCERIN 0.4 MG SL SUBL: 0.4000 mg | SUBLINGUAL_TABLET | SUBLINGUAL | Status: DC | PRN

## 2017-05-28 MED ORDER — SODIUM CHLORIDE 0.9% FLUSH
3.0000 mL | Freq: Two times a day (BID) | INTRAVENOUS | Status: DC
  Administered 2017-05-30: 3 mL via INTRAVENOUS

## 2017-05-28 MED ORDER — HEPARIN SODIUM (PORCINE) 5000 UNIT/ML IJ SOLN
4000.0000 [IU] | Freq: Once | INTRAMUSCULAR | Status: AC
  Administered 2017-05-28: 4000 [IU] via INTRAVENOUS

## 2017-05-28 MED ORDER — HEPARIN SODIUM (PORCINE) 1000 UNIT/ML IJ SOLN
INTRAMUSCULAR | Status: DC | PRN
  Administered 2017-05-28: 2000 [IU] via INTRAVENOUS
  Administered 2017-05-28: 4000 [IU] via INTRAVENOUS

## 2017-05-28 MED ORDER — FENTANYL CITRATE (PF) 100 MCG/2ML IJ SOLN
INTRAMUSCULAR | Status: AC
  Filled 2017-05-28: qty 2

## 2017-05-28 MED ORDER — CARVEDILOL 3.125 MG PO TABS
3.1250 mg | ORAL_TABLET | Freq: Two times a day (BID) | ORAL | Status: DC
  Administered 2017-05-28 – 2017-05-30 (×4): 3.125 mg via ORAL
  Filled 2017-05-28 (×4): qty 1

## 2017-05-28 MED ORDER — ENOXAPARIN SODIUM 40 MG/0.4ML ~~LOC~~ SOLN: 40.0000 mg | SUBCUTANEOUS | Status: DC

## 2017-05-28 MED ORDER — ATORVASTATIN CALCIUM 20 MG PO TABS
80.0000 mg | ORAL_TABLET | Freq: Every day | ORAL | Status: DC
  Administered 2017-05-29: 80 mg via ORAL
  Filled 2017-05-28: qty 4

## 2017-05-28 MED ORDER — PANTOPRAZOLE SODIUM 40 MG PO TBEC
40.0000 mg | DELAYED_RELEASE_TABLET | Freq: Every day | ORAL | Status: DC
  Administered 2017-05-29 – 2017-05-30 (×2): 40 mg via ORAL
  Filled 2017-05-28 (×2): qty 1

## 2017-05-28 MED ORDER — MORPHINE SULFATE (PF) 2 MG/ML IV SOLN: 2.0000 mg | INTRAVENOUS | Status: DC | PRN

## 2017-05-28 MED ORDER — METOPROLOL TARTRATE 50 MG PO TABS: 50.0000 mg | ORAL_TABLET | Freq: Two times a day (BID) | ORAL | Status: DC

## 2017-05-28 MED ORDER — HYDROCODONE-ACETAMINOPHEN 5-325 MG PO TABS: 1.0000 | ORAL_TABLET | ORAL | Status: DC | PRN

## 2017-05-28 MED ORDER — NITROGLYCERIN 5 MG/ML IV SOLN
INTRAVENOUS | Status: AC
  Filled 2017-05-28: qty 10

## 2017-05-28 MED ORDER — GALANTAMINE HYDROBROMIDE 4 MG PO TABS
8.0000 mg | ORAL_TABLET | Freq: Two times a day (BID) | ORAL | Status: DC
  Administered 2017-05-28 – 2017-05-30 (×4): 8 mg via ORAL
  Filled 2017-05-28 (×6): qty 2

## 2017-05-28 MED ORDER — HEPARIN SODIUM (PORCINE) 1000 UNIT/ML IJ SOLN
INTRAMUSCULAR | Status: AC
  Filled 2017-05-28: qty 1

## 2017-05-28 MED ORDER — TICAGRELOR 90 MG PO TABS
90.0000 mg | ORAL_TABLET | Freq: Two times a day (BID) | ORAL | Status: DC
  Administered 2017-05-28 – 2017-05-30 (×4): 90 mg via ORAL
  Filled 2017-05-28 (×6): qty 1

## 2017-05-28 MED ORDER — ASPIRIN 81 MG PO CHEW
81.0000 mg | CHEWABLE_TABLET | Freq: Every day | ORAL | Status: DC
  Administered 2017-05-29 – 2017-05-30 (×2): 81 mg via ORAL
  Filled 2017-05-28 (×2): qty 1

## 2017-05-28 MED ORDER — AMLODIPINE BESYLATE 5 MG PO TABS
5.0000 mg | ORAL_TABLET | Freq: Every day | ORAL | Status: DC
  Administered 2017-05-28 – 2017-05-30 (×3): 5 mg via ORAL
  Filled 2017-05-28 (×3): qty 1

## 2017-05-28 MED ORDER — NITROGLYCERIN 1 MG/10 ML FOR IR/CATH LAB
INTRA_ARTERIAL | Status: DC | PRN
  Administered 2017-05-28: 200 ug via INTRACORONARY

## 2017-05-28 MED ORDER — POLYETHYLENE GLYCOL 3350 17 G PO PACK: 17.0000 g | PACK | Freq: Every day | ORAL | Status: DC | PRN

## 2017-05-28 SURGICAL SUPPLY — 17 items
BALLN TREK RX 2.5X12 (BALLOONS) ×3
BALLN ~~LOC~~ TREK RX 2.75X12 (BALLOONS) ×3
BALLOON TREK RX 2.5X12 (BALLOONS) ×1 IMPLANT
BALLOON ~~LOC~~ TREK RX 2.75X12 (BALLOONS) ×1 IMPLANT
CATH HEARTRAIL 6F IL3.5 (CATHETERS) ×3 IMPLANT
CATH INFINITI JR4 5F (CATHETERS) ×3 IMPLANT
CATH LAUNCHER 6FR JL3.5 (CATHETERS) ×3 IMPLANT
CATH VISTA GUIDE 6FR XBLAD3.5 (CATHETERS) ×3 IMPLANT
DEVICE INFLAT 30 PLUS (MISCELLANEOUS) ×3 IMPLANT
DEVICE RAD TR BAND REGULAR (VASCULAR PRODUCTS) ×3 IMPLANT
GLIDESHEATH SLEND SS 6F .021 (SHEATH) ×3 IMPLANT
KIT MANI 3VAL PERCEP (MISCELLANEOUS) ×3 IMPLANT
PACK CARDIAC CATH (CUSTOM PROCEDURE TRAY) ×3 IMPLANT
STENT SIERRA 2.50 X 15 MM (Permanent Stent) ×3 IMPLANT
WIRE HITORQ VERSACORE ST 145CM (WIRE) ×3 IMPLANT
WIRE ROSEN-J .035X260CM (WIRE) ×3 IMPLANT
WIRE RUNTHROUGH .014X180CM (WIRE) ×3 IMPLANT

## 2017-05-28 NOTE — ED Notes (Signed)
Dr. Arida at bedside 

## 2017-05-28 NOTE — CV Procedure (Signed)
Emergent cardiac catheterization was done via the right radial artery for S elevation myocardial infarction.  Should occluded ramus proximally which was successfully treated with PCI and drug-eluting stent placement.  The patient has borderline disease in the LAD that will need to be addressed later probably with outpatient stress testing. Continue dual antiplatelet therapy for at least one year.  Full cath report to follow.

## 2017-05-28 NOTE — Progress Notes (Signed)
   05/28/17 1515  Clinical Encounter Type  Visited With Patient and family together  Visit Type Follow-up  Spiritual Encounters  Spiritual Needs Emotional   Chaplain saw family on unit rounds. Pastoral visit with patient and spouse.  Continue following patient and family.

## 2017-05-28 NOTE — H&P (Signed)
[facility] NAME: Troy Chavez    MR#:  [id number]  DATE OF BIRTH:  [birth year]  DATE OF ADMISSION:  05/28/2017  PRIMARY CARE PHYSICIAN: Rusty Aus, MD   REQUESTING/REFERRING PHYSICIAN:   CHIEF COMPLAINT:   Chief Complaint  Patient presents with  . Code STEMI    HISTORY OF PRESENT ILLNESS: Troy Chavez  is a 72 y.o. male with a known history per below presenting to the emergency room from the clinic for abnormal EKG, patient noted to have chest pain/chest pressure in his mid chest that started around 11:45 AM associated with nausea, pain was moderate to severe in nature, EKG noted for ST segment elevation far laterally, white count 11.5, code STEMI call, patient taken up to the Cath Lab status post drug-eluting stent placement given occlusion of his ramus by Dr. Velva Harman, hospitalist service subsequently contacted for further evaluation/admission, patient states he is chest pain-free, case discussed with cardiology/intensive, will admit to ICU for acute STEMI.  PAST MEDICAL HISTORY:   Past Medical History:  Diagnosis Date  . Alzheimer's disease   . CKD (chronic kidney disease), stage IV ([place])   . Hypertension     PAST SURGICAL HISTORY: History reviewed. No pertinent surgical history.  SOCIAL HISTORY:  Social History   Tobacco Use  . Smoking status: Former Research scientist (life sciences)  . Smokeless tobacco: Never Used  Substance Use Topics  . Alcohol use: Yes    FAMILY HISTORY: History reviewed. No pertinent family history.  DRUG ALLERGIES: No Known Allergies  REVIEW OF SYSTEMS:   CONSTITUTIONAL: No fever, +fatigue or weakness.  EYES: No blurred or double vision.  EARS, NOSE, AND THROAT: No tinnitus or ear pain.  RESPIRATORY: No cough, shortness of breath, wheezing or hemoptysis.  CARDIOVASCULAR: + chest pain, no orthopnea, edema.  GASTROINTESTINAL: No nausea, vomiting, diarrhea or abdominal pain.  GENITOURINARY: No dysuria, hematuria.   ENDOCRINE: No polyuria, nocturia,  HEMATOLOGY: No anemia, easy bruising or bleeding SKIN: No rash or lesion. MUSCULOSKELETAL: No joint pain or arthritis.   NEUROLOGIC: No tingling, numbness, weakness.  PSYCHIATRY: No anxiety or depression.   MEDICATIONS AT HOME:  Prior to Admission medications   Medication Sig Start Date End Date Taking? Authorizing Provider  amLODipine (NORVASC) 5 MG tablet Take 5 mg by mouth 2 (two) times daily. 03/25/17  Yes [provider]  atorvastatin (LIPITOR) 10 MG tablet Take 10 mg by mouth daily. 03/02/17  Yes [provider]  galantamine (RAZADYNE) 8 MG tablet Take 8 mg by mouth 2 (two) times daily. 03/02/17  Yes [provider]  pantoprazole (PROTONIX) 40 MG tablet Take 1 tablet (40 mg total) by mouth daily. 05/08/17  Yes Max Sane, MD  tamsulosin (FLOMAX) 0.4 MG CAPS capsule Take 0.4 mg by mouth daily. 05/06/17  Yes [provider]      PHYSICAL EXAMINATION:   VITAL SIGNS: Blood pressure (!) 158/101, temperature 97.7 F (36.5 C), temperature source Oral, resp. rate 18, height 5\' 9"  (1.753 m), weight 83 kg (183 lb), SpO2 100 %.  GENERAL:  72 y.o.-year-old patient lying in the bed with no acute distress.  EYES: Pupils equal, round, reactive to light and accommodation. No scleral icterus. Extraocular muscles intact.  HEENT: Head atraumatic, normocephalic. Oropharynx and nasopharynx clear.  NECK:  Supple, no jugular venous distention. No thyroid enlargement, no tenderness.  LUNGS: Normal breath sounds bilaterally, no wheezing, rales,rhonchi or crepitation. No use of accessory muscles of respiration.  CARDIOVASCULAR: S1, S2  normal. No murmurs, rubs, or gallops.  ABDOMEN: Soft, nontender, nondistended. Bowel sounds present. No organomegaly or mass.  EXTREMITIES: No pedal edema, cyanosis, or clubbing.  NEUROLOGIC: Cranial nerves II through XII are intact. Muscle strength 5/5 in all extremities. Sensation intact. Gait not  checked.  PSYCHIATRIC: The patient is alert and oriented x 3.  SKIN: No obvious rash, lesion, or ulcer.   LABORATORY PANEL:   CBC Recent Labs  Lab 05/28/17 1243  WBC 11.5*  HGB 15.2  HCT 46.1  PLT 264  MCV 89.3  MCH 29.5  MCHC 33.1  RDW 13.5  LYMPHSABS 2.1  MONOABS 1.3*  EOSABS 0.3  BASOSABS 0.1   ------------------------------------------------------------------------------------------------------------------  Chemistries  Recent Labs  Lab 05/28/17 1243  NA 138  K 3.6  CL 102  CO2 21*  GLUCOSE 103*  BUN 26*  CREATININE 2.31*  CALCIUM 9.8  AST 28  ALT 18  ALKPHOS 85  BILITOT 0.8   ------------------------------------------------------------------------------------------------------------------ estimated creatinine clearance is 28.5 mL/min (A) (by C-G formula based on SCr of 2.31 mg/dL (H)). ------------------------------------------------------------------------------------------------------------------ No results for input(s): TSH, T4TOTAL, T3FREE, THYROIDAB in the last 72 hours.  Invalid input(s): FREET3   Coagulation profile No results for input(s): INR, PROTIME in the last 168 hours. ------------------------------------------------------------------------------------------------------------------- No results for input(s): DDIMER in the last 72 hours. -------------------------------------------------------------------------------------------------------------------  Cardiac Enzymes Recent Labs  Lab 05/28/17 1243  TROPONINI 0.36*   ------------------------------------------------------------------------------------------------------------------ Invalid input(s): POCBNP  ---------------------------------------------------------------------------------------------------------------  Urinalysis No results found for: COLORURINE, APPEARANCEUR, LABSPEC, PHURINE, GLUCOSEU, HGBUR, BILIRUBINUR, KETONESUR, PROTEINUR, UROBILINOGEN, NITRITE,  LEUKOCYTESUR   RADIOLOGY: No results found.  EKG: Orders placed or performed in visit on 05/28/17  . 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 Management plans discussed with the patient, family and they are in agreement.  CODE STATUS: Code Status History    Date Active Date Inactive Code Status Order ID Comments User Context   05/07/2017 00:34 05/07/2017 18:20 Full Code [id number]  Lance Coon, MD Inpatient       TOTAL TIME TAKING CARE OF THIS PATIENT: 45 minutes of critical care time was used to discuss case with intensivist, ED attending, cardiologist, and the patient with all questions answered.    Avel Peace Leaman Abe M.D on 05/28/2017   Between 7am to 6pm - Pager - [phone]  After 6pm go to [email or web address] - password [email or web address]  Office  [phone]  CC: Primary care physician; Rusty Aus, MD   Note: This dictation was prepared with Dragon dictation along with smaller phrase technology. Any transcriptional errors that result from this process are unintentional.

## 2017-05-28 NOTE — Consult Note (Signed)
Cardiology Consultation:   Patient ID: Troy Chavez; [id number]; 12/10/1945   Admit date: 05/28/2017 Date of Consult: 05/28/2017  Primary Care Provider: Rusty Aus, MD Primary Cardiologist: new ( Dr. Fletcher Anon) Primary Electrophysiologist:  n/a   Patient Profile:   Troy Chavez is a 72 y.o. male with a hx of hypertension, hyperlipidemia, mild dementia and stage IV chronic kidney disease who is being seen today for the evaluation of chest pain and abnormal EKG at the request of Dr. Mable Paris.  History of Present Illness:   Troy Chavez is a 72 year old male with the above medical problems.  He was hospitalized a few weeks ago with chest pain described as indigestion and was found to have mildly elevated troponin that peaked at 0.28.  At that time, his symptoms were felt to be atypical and no further workup was performed. The patient presents back with acute onset of substernal chest pain which started around 1145 this morning.  It was substernal and severe in intensity described as burning and tightness sensation.  It was not relieved by nitroglycerin. He was sent to the emergency room where he was found to have lateral ST elevation in his EKG with inferior reciprocal changes.  A code STEMI was activated. I saw the patient in the emergency department and he was still actively with chest pain.  Given his symptoms and EKG changes I recommended proceeding with emergent cardiac catheterization and possible PCI.  I discussed with the patient and his wife risks and benefits especially the risk of contrast-induced nephropathy given his advanced chronic kidney disease.  Nonetheless, I felt that the cardiac cath and possible PCI is the best option in this situation.  Past Medical History:  Diagnosis Date  . Alzheimer's disease   . CKD (chronic kidney disease), stage IV ([place])   . Hypertension     History reviewed. No pertinent surgical history.   Home Medications:  Prior to Admission  medications   Medication Sig Start Date End Date Taking? Authorizing Provider  amLODipine (NORVASC) 5 MG tablet Take 5 mg by mouth 2 (two) times daily. 03/25/17  Yes [provider]  atorvastatin (LIPITOR) 10 MG tablet Take 10 mg by mouth daily. 03/02/17  Yes [provider]  galantamine (RAZADYNE) 8 MG tablet Take 8 mg by mouth 2 (two) times daily. 03/02/17  Yes [provider]  pantoprazole (PROTONIX) 40 MG tablet Take 1 tablet (40 mg total) by mouth daily. 05/08/17  Yes Max Sane, MD  tamsulosin (FLOMAX) 0.4 MG CAPS capsule Take 0.4 mg by mouth daily. 05/06/17  Yes [provider]    Inpatient Medications: Scheduled Meds: . amLODipine  5 mg Oral Daily  . aspirin  81 mg Oral Daily  . atorvastatin  80 mg Oral q1800  . carvedilol  3.125 mg Oral BID WC  . [START ON 05/29/2017] enoxaparin (LOVENOX) injection  30 mg Subcutaneous Q24H  . galantamine  8 mg Oral BID WC  . [START ON 05/29/2017] pantoprazole  40 mg Oral QAC breakfast  . senna  1 tablet Oral BID  . sodium chloride flush  3 mL Intravenous Q12H  . sodium chloride flush  3 mL Intravenous Q12H  . sodium chloride flush  3 mL Intravenous Q12H  . tamsulosin  0.4 mg Oral Daily  . ticagrelor  90 mg Oral BID   Continuous Infusions: . sodium chloride 100 mL/hr at 05/28/17 1600  . sodium chloride    . sodium chloride     PRN Meds:  sodium chloride, sodium chloride, acetaminophen **OR** acetaminophen, HYDROcodone-acetaminophen, morphine injection, nitroGLYCERIN, ondansetron **OR** ondansetron (ZOFRAN) IV, polyethylene glycol, sodium chloride flush, sodium chloride flush  Allergies:   No Known Allergies  Social History:   Social History   Socioeconomic History  . Marital status: Married    Spouse name: Not on file  . Number of children: Not on file  . Years of education: Not on file  . Highest education level: Not on file  Social Needs  . Financial resource strain: Not on file  . Food  insecurity - worry: Not on file  . Food insecurity - inability: Not on file  . Transportation needs - medical: Not on file  . Transportation needs - non-medical: Not on file  Occupational History  . Not on file  Tobacco Use  . Smoking status: Former Research scientist (life sciences)  . Smokeless tobacco: Never Used  Substance and Sexual Activity  . Alcohol use: Yes  . Drug use: No  . Sexual activity: Not on file  Other Topics Concern  . Not on file  Social History Narrative  . Not on file    Family History:   History reviewed. No pertinent family history.  No family history of coronary artery disease.  ROS:  Please see the history of present illness.  ROS  All other ROS reviewed and negative.     Physical Exam/Data:   Vitals:   05/28/17 1450 05/28/17 1500 05/28/17 1600 05/28/17 1700  BP: 116/83 140/81 130/87 (!) 150/92  Pulse: 64 64 76 (!) 59  Resp: 17 19 13 15   Temp: 97.9 F (36.6 C)     TempSrc: Oral     SpO2: 100% 97% 96% 95%  Weight: 185 lb 3 oz (84 kg)     Height: 5\' 9"  (1.753 m)       Intake/Output Summary (Last 24 hours) at 05/28/2017 1740 Last data filed at 05/28/2017 1638 Gross per 24 hour  Intake 71.67 ml  Output 200 ml  Net -128.33 ml   Filed Weights   05/28/17 1245 05/28/17 1450  Weight: 183 lb (83 kg) 185 lb 3 oz (84 kg)   Body mass index is 27.35 kg/m.  General:  Well nourished, well developed, in mild distress due to pain. HEENT: normal Lymph: no adenopathy Neck: no JVD Endocrine:  No thryomegaly Vascular: No carotid bruits; FA pulses 2+ bilaterally without bruits  Cardiac:  normal S1, S2; RRR; no murmur  Lungs:  clear to auscultation bilaterally, no wheezing, rhonchi or rales  Abd: soft, nontender, no hepatomegaly  Ext: no edema Musculoskeletal:  No deformities, BUE and BLE strength normal and equal Skin: warm and dry  Neuro:  CNs 2-12 intact, no focal abnormalities noted Psych:  Normal affect   EKG:  The EKG was personally reviewed and demonstrates: Normal  sinus rhythm with 2 mm of ST elevation in lead I and aVL with reciprocal ST depression in the inferior leads. Telemetry:  Telemetry was personally reviewed and demonstrates: Normal sinus rhythm  Relevant CV Studies:   Laboratory Data:  Chemistry Recent Labs  Lab 05/28/17 1243  NA 138  K 3.6  CL 102  CO2 21*  GLUCOSE 103*  BUN 26*  CREATININE 2.31*  CALCIUM 9.8  GFRNONAA 26*  GFRAA 31*  ANIONGAP 15    Recent Labs  Lab 05/28/17 1243  PROT 8.6*  ALBUMIN 4.8  AST 28  ALT 18  ALKPHOS 85  BILITOT 0.8   Hematology Recent Labs  Lab 05/28/17 1243  WBC  11.5*  RBC 5.16  HGB 15.2  HCT 46.1  MCV 89.3  MCH 29.5  MCHC 33.1  RDW 13.5  PLT 264   Cardiac Enzymes Recent Labs  Lab 05/28/17 1243 05/28/17 1453  TROPONINI 0.36* 3.71*   No results for input(s): TROPIPOC in the last 168 hours.  BNPNo results for input(s): BNP, PROBNP in the last 168 hours.  DDimer No results for input(s): DDIMER in the last 168 hours.  Radiology/Studies:  No results found.  Assessment and Plan:   1. Acute lateral ST elevation myocardial infarction: Emergent cardiac catheterization was done which showed occluded ostial and proximal ramus intermedius artery which was the culprit.  There was also borderline significant disease in proximal and mid LAD.  LVEDP was normal.  I performed successful angioplasty and drug-eluting stent placement to the ramus without complications.  Continue dual antiplatelet therapy for at least one year.  Hydration for 12 hours to decrease the chance of contrast-induced nephropathy.  Obtain an echocardiogram.  Recommend stress testing in about 6 weeks to evaluate for ischemia in LAD distribution. 2. Hyperlipidemia: I increase atorvastatin to 80 mg daily. 3. Essential hypertension: I added small dose carvedilol.  Continue amlodipine. 4. GERD: Continue PPI especially with the addition of Brilinta. 5. Stage IV chronic kidney disease: Hydration for 12 hours and recheck  renal function in the morning.   For questions or updates, please contact [facility] Please consult [email or web address] for contact info under Cardiology/STEMI.   Signed, Kathlyn Sacramento, MD  05/28/2017 5:40 PM

## 2017-05-28 NOTE — ED Provider Notes (Signed)
[facility] Emergency Department Provider Note  ____________________________________________   First MD Initiated Contact with Patient 05/28/17 1243     (approximate)  I have reviewed the triage vital signs and the nursing notes.   HISTORY  Chief Complaint Code STEMI   HPI Troy Chavez is a 72 y.o. male sent to the emergency department from clinic for abnormal EKG and chest pain.  The patient reports chest pain that began rather suddenly at 1145 today.  It is pressure-like aching associated with nausea in his left substernal area.  He has some numbness and tingling in his left.  The pain is not exertional.  It does seem to come and go but nothing in particular seems to make it come and go.  It is currently moderate to severe.  It is not ripping or tearing and does not go to his back.  He has a history of chronic kidney disease as well as hypertension.  He has never seen a cardiologist.  He has had no aspirin today.  Past Medical History:  Diagnosis Date  . Alzheimer's disease   . CKD (chronic kidney disease), stage IV ([place])   . Hypertension     Patient Active Problem List   Diagnosis Date Noted  . Chest pain 05/06/2017  . CKD (chronic kidney disease), stage IV ([place]) 05/06/2017  . HTN (hypertension) 05/06/2017  . Alzheimer disease 05/06/2017  . GERD (gastroesophageal reflux disease) 05/06/2017    History reviewed. No pertinent surgical history.  Prior to Admission medications   Medication Sig Start Date End Date Taking? Authorizing Provider  amLODipine (NORVASC) 5 MG tablet Take 5 mg by mouth 2 (two) times daily. 03/25/17  Yes [provider]  atorvastatin (LIPITOR) 10 MG tablet Take 10 mg by mouth daily. 03/02/17  Yes [provider]  galantamine (RAZADYNE) 8 MG tablet Take 8 mg by mouth 2 (two) times daily. 03/02/17  Yes [provider]  pantoprazole (PROTONIX) 40 MG tablet Take 1 tablet (40 mg total) by mouth daily.  05/08/17  Yes Max Sane, MD  tamsulosin (FLOMAX) 0.4 MG CAPS capsule Take 0.4 mg by mouth daily. 05/06/17  Yes [provider]    Allergies Patient has no known allergies.  History reviewed. No pertinent family history.  Social History Social History   Tobacco Use  . Smoking status: Former Research scientist (life sciences)  . Smokeless tobacco: Never Used  Substance Use Topics  . Alcohol use: Yes  . Drug use: No    Review of Systems Constitutional: No fever/chills Eyes: No visual changes. ENT: No sore throat. Cardiovascular: Positive for chest pain. Respiratory: Denies shortness of breath. Gastrointestinal: No abdominal pain.  Positive for nausea, no vomiting.  No diarrhea.  No constipation. Genitourinary: Negative for dysuria. Musculoskeletal: Negative for back pain. Skin: Negative for rash. Neurological: Negative for headaches, focal weakness or numbness.   ____________________________________________   PHYSICAL EXAM:  VITAL SIGNS: ED Triage Vitals  Enc Vitals Group     BP      Pulse      Resp      Temp      Temp src      SpO2      Weight      Height      Head Circumference      Peak Flow      Pain Score      Pain Loc      Pain Edu?      Excl. in [place]?  Constitutional: Alert and oriented x4 clenching his chest diaphoretic appears uncomfortable in moderate distress Eyes: PERRL EOMI. Head: Atraumatic. Nose: No congestion/rhinnorhea. Mouth/Throat: No trismus Neck: No stridor.   Cardiovascular: Normal rate, regular rhythm. Grossly normal heart sounds.  Good peripheral circulation. Respiratory: Normal respiratory effort.  No retractions. Lungs CTAB and moving good air Gastrointestinal: Soft nontender Musculoskeletal: No lower extremity edema   Neurologic:  Normal speech and language. No gross focal neurologic deficits are appreciated. Skin: Diaphoretic Psychiatric: Uncomfortable appearing    ____________________________________________   DIFFERENTIAL includes  but not limited to  STEMI, non-STEMI, aortic dissection, pulmonary embolism, gastritis ____________________________________________   LABS (all labs ordered are listed, but only abnormal results are displayed)  Labs Reviewed  CBC WITH DIFFERENTIAL/PLATELET - Abnormal; Notable for the following components:      Result Value   WBC 11.5 (*)    Neutro Abs 7.8 (*)    Monocytes Absolute 1.3 (*)    All other components within normal limits  APTT  COMPREHENSIVE METABOLIC PANEL  TROPONIN I    Lab work reviewed by me shows slightly elevated white count which is nonspecific __________________________________________  EKG  ED ECG REPORT I, Darel Hong, the attending physician, personally viewed and interpreted this ECG.  Date: 05/28/2017 EKG Time:  Rate: 78 Rhythm: normal sinus rhythm QRS Axis: normal Intervals: normal ST/T Wave abnormalities: ST elevation in 1 and aVL reduction in leads III and aVF concerning for ST elevation myocardial infarction Narrative Interpretation: STEMI  ____________________________________________  RADIOLOGY   ____________________________________________   PROCEDURES  Procedure(s) performed: no  .Critical Care Performed by: Darel Hong, MD Authorized by: Darel Hong, MD   Critical care provider statement:    Critical care time (minutes):  35   Critical care time was exclusive of:  Separately billable procedures and treating other patients   Critical care was necessary to treat or prevent imminent or life-threatening deterioration of the following conditions:  Cardiac failure   Critical care was time spent personally by me on the following activities:  Development of treatment plan with patient or surrogate, discussions with consultants, evaluation of patient's response to treatment, examination of patient, obtaining history from patient or surrogate, ordering and performing treatments and interventions, ordering and review of  laboratory studies, ordering and review of radiographic studies, pulse oximetry, re-evaluation of patient's condition and review of old charts    Critical Care performed: yes  Observation: no ____________________________________________   INITIAL IMPRESSION / ASSESSMENT AND PLAN / ED COURSE  Pertinent labs & imaging results that were available during my care of the patient were reviewed by me and considered in my medical decision making (see chart for details).  The patient arrives to triage uncomfortable appearing clutching his chest and diaphoretic.  He had an EKG performed in clinic today concerning for STEMI.  Repeat EKG here confirms dynamic EKG changes concerning for STEMI.  Placed the patient on the monitor, establish IV access, put him on pacer pads, and given 324 mg of aspirin.  Dr. Fletcher Anon came to the bedside immediately and agreed with the diagnosis of STEMI and took the patient to the cardiac catheterization.      ____________________________________________   FINAL CLINICAL IMPRESSION(S) / ED DIAGNOSES  Final diagnoses:  ST elevation myocardial infarction (STEMI), unspecified artery ([place])      NEW MEDICATIONS STARTED DURING THIS VISIT:  Current Discharge Medication List       Note:  This document was prepared using Dragon voice recognition software and may include unintentional dictation  errors.     Darel Hong, MD 05/28/17 1318

## 2017-05-28 NOTE — Progress Notes (Signed)
   05/28/17 1245  Clinical Encounter Type  Visited With Patient;Family;Other (Comment) (reported to ED 6 for code STEMI)  Visit Type Code (Code STEMI)  Referral From Physician   Chaplain responded to code, maintained pastoral presence.  Walked with family to waiting room.  Offered to stay with family; family declined but is open to ongoing prayers and to chaplain checking back on them.

## 2017-05-28 NOTE — ED Triage Notes (Signed)
Pt presents to ED via POV with c/o chest pain that started at 1145. Pt states pain radiates to L arm. Pt describes pain in his chest as being a pressure. Pt states took home meds this morning but did not take any medications after chest pain started.

## 2017-05-28 NOTE — Progress Notes (Signed)
   05/28/17 1400  Clinical Encounter Type  Visited With Family  Visit Type Follow-up  Spiritual Encounters  Spiritual Needs Emotional   Chaplain followed up with patient family; family reported on patient progress. Declined visit but appreciative of support.

## 2017-05-29 ENCOUNTER — Inpatient Hospital Stay (HOSPITAL_COMMUNITY)
Admit: 2017-05-29 | Discharge: 2017-05-29 | Disposition: A | Discharge status: Home / Self Care | Payer: PPO | Attending: Cardiovascular Disease | Admitting: Cardiovascular Disease

## 2017-05-29 ENCOUNTER — Encounter: Payer: Self-pay | Admitting: Cardiovascular Disease

## 2017-05-29 DIAGNOSIS — N184 Chronic kidney disease, stage 4 (severe): Secondary | ICD-10-CM

## 2017-05-29 DIAGNOSIS — I5189 Other ill-defined heart diseases: Secondary | ICD-10-CM

## 2017-05-29 DIAGNOSIS — I255 Ischemic cardiomyopathy: Secondary | ICD-10-CM

## 2017-05-29 DIAGNOSIS — E785 Hyperlipidemia, unspecified: Secondary | ICD-10-CM

## 2017-05-29 DIAGNOSIS — I77819 Aortic ectasia, unspecified site: Secondary | ICD-10-CM

## 2017-05-29 DIAGNOSIS — I10 Essential (primary) hypertension: Secondary | ICD-10-CM

## 2017-05-29 DIAGNOSIS — I5030 Unspecified diastolic (congestive) heart failure: Secondary | ICD-10-CM

## 2017-05-29 HISTORY — DX: Other ill-defined heart diseases: I51.89

## 2017-05-29 HISTORY — DX: Aortic ectasia, unspecified site: I77.819

## 2017-05-29 HISTORY — DX: Ischemic cardiomyopathy: I25.5

## 2017-05-29 LAB — BASIC METABOLIC PANEL
ANION GAP: 9 (ref 5–15)
BUN: 23 mg/dL — ABNORMAL HIGH (ref 6–20)
CALCIUM: 8.9 mg/dL (ref 8.9–10.3)
CO2: 23 mmol/L (ref 22–32)
Chloride: 107 mmol/L (ref 101–111)
Creatinine, Ser: 1.98 mg/dL — ABNORMAL HIGH (ref 0.61–1.24)
GFR calc Af Amer: 37 mL/min — ABNORMAL LOW (ref >60)
GFR, EST NON AFRICAN AMERICAN: 32 mL/min — AB (ref >60)
Glucose, Bld: 92 mg/dL (ref 65–99)
Potassium: 3.7 mmol/L (ref 3.5–5.1)
Sodium: 139 mmol/L (ref 135–145)

## 2017-05-29 LAB — CBC
HCT: 39.1 % — ABNORMAL LOW (ref 40.0–52.0)
HEMOGLOBIN: 13.5 g/dL (ref 13.0–18.0)
MCH: 30.5 pg (ref 26.0–34.0)
MCHC: 34.4 g/dL (ref 32.0–36.0)
MCV: 88.7 fL (ref 80.0–100.0)
PLATELETS: 220 10*3/uL (ref 150–440)
RBC: 4.41 MIL/uL (ref 4.40–5.90)
RDW: 12.6 % (ref 11.5–14.5)
WBC: 9.5 10*3/uL (ref 3.8–10.6)

## 2017-05-29 LAB — ECHOCARDIOGRAM COMPLETE
Height: 69 in
Weight: 2962.98 oz

## 2017-05-29 LAB — PHOSPHORUS: Phosphorus: 3.2 mg/dL (ref 2.5–4.6)

## 2017-05-29 LAB — LIPID PANEL
CHOL/HDL RATIO: 4.1 ratio
CHOLESTEROL: 214 mg/dL — AB (ref 0–200)
HDL: 52 mg/dL (ref >40)
LDL Cholesterol: 146 mg/dL — ABNORMAL HIGH (ref 0–99)
TRIGLYCERIDES: 82 mg/dL (ref <150)
VLDL: 16 mg/dL (ref 0–40)

## 2017-05-29 LAB — MAGNESIUM: Magnesium: 1.9 mg/dL (ref 1.7–2.4)

## 2017-05-29 LAB — TROPONIN I: Troponin I: 13.31 ng/mL (ref <0.03)

## 2017-05-29 NOTE — Progress Notes (Signed)
[facility] NAME: Perkins Molina    MR#:  [id number]  DATE OF BIRTH:  [birth year]  SUBJECTIVE:   No chest pain overnight  REVIEW OF SYSTEMS:    Review of Systems  Constitutional: Negative for fever, chills weight loss HENT: Negative for ear pain, nosebleeds, congestion, facial swelling, rhinorrhea, neck pain, neck stiffness and ear discharge.   Respiratory: Negative for cough, shortness of breath, wheezing  Cardiovascular: Negative for chest pain, palpitations and leg swelling.  Gastrointestinal: Negative for heartburn, abdominal pain, vomiting, diarrhea or consitpation Genitourinary: Negative for dysuria, urgency, frequency, hematuria Musculoskeletal: Negative for back pain or joint pain Neurological: Negative for dizziness, seizures, syncope, focal weakness,  numbness and headaches.  Hematological: Does not bruise/bleed easily.  Psychiatric/Behavioral: Negative for hallucinations, confusion, dysphoric mood    Tolerating Diet: yes      DRUG ALLERGIES:  No Known Allergies  VITALS:  Blood pressure (!) 132/96, pulse 63, temperature 98.1 F (36.7 C), temperature source Oral, resp. rate 19, height 5\' 9"  (1.753 m), weight 84 kg (185 lb 3 oz), SpO2 96 %.  PHYSICAL EXAMINATION:  Constitutional: Appears well-developed and well-nourished. No distress. HENT: Normocephalic. Marland Kitchen Oropharynx is clear and moist.  Eyes: Conjunctivae and EOM are normal. PERRLA, no scleral icterus.  Neck: Normal ROM. Neck supple. No JVD. No tracheal deviation. CVS: RRR, S1/S2 +, no murmurs, no gallops, no carotid bruit.  Pulmonary: Effort and breath sounds normal, no stridor, rhonchi, wheezes, rales.  Abdominal: Soft. BS +,  no distension, tenderness, rebound or guarding.  Musculoskeletal: Normal range of motion. No edema and no tenderness.  Neuro: Alert. CN 2-12 grossly intact. No focal deficits. Skin: Skin is warm and dry. No rash noted. Psychiatric:  Normal mood and affect.      LABORATORY PANEL:   CBC Recent Labs  Lab 05/29/17 0242  WBC 9.5  HGB 13.5  HCT 39.1*  PLT 220   ------------------------------------------------------------------------------------------------------------------  Chemistries  Recent Labs  Lab 05/28/17 1243 05/29/17 0242  NA 138 139  K 3.6 3.7  CL 102 107  CO2 21* 23  GLUCOSE 103* 92  BUN 26* 23*  CREATININE 2.31* 1.98*  CALCIUM 9.8 8.9  MG  --  1.9  AST 28  --   ALT 18  --   ALKPHOS 85  --   BILITOT 0.8  --    ------------------------------------------------------------------------------------------------------------------  Cardiac Enzymes Recent Labs  Lab 05/28/17 1453 05/28/17 2042 05/29/17 0242  TROPONINI 3.71* 21.07* 13.31*   ------------------------------------------------------------------------------------------------------------------  RADIOLOGY:  No results found.   ASSESSMENT AND PLAN:    72 year old male with essential hypertension and chronic kidney disease stage IV who presented with chest pain and found to have ST elevation MI.   1. Lateral ST elevation MI due to occlusion of rami intermedius status post GEX:[id number] max 21.07 Continue dual antiplatelet therapy with aspirin and Brillanta Continue Coreg and Norvasc Consider functional study 1-2 months to evaluate symptoms as well as ischemic burden. He has moderate to severe residual disease involving LAD Follow up on echocardiogram  2. Essential hypertension: Continue Norvasc and Coreg  3. Hyperlipidemia LDL 146 not at goal: Continue Lipitor Repeat lipid panel in 2 months  4. CKD stage 4:  Creatinine improved  Recheck in am   Management plans discussed with the patient and he is in agreement.  CODE STATUS: full  TOTAL TIME TAKING CARE OF THIS PATIENT: 30 minutes.     POSSIBLE D/C tomorrow, DEPENDING ON CLINICAL CONDITION.  Keidra Withers M.D on 05/29/2017 at 10:57 AM  Between 7am to 6pm -  Pager - [phone] After 6pm go to [email or web address] - password [email or web address] [facility]  Office  [phone]  CC: Primary care physician; Rusty Aus, MD  Note: This dictation was prepared with Dragon dictation along with smaller phrase technology. Any transcriptional errors that result from this process are unintentional.

## 2017-05-29 NOTE — Progress Notes (Signed)
Patient walked 3 laps around the unit.  Tolerated well. No shortness of breath- or pain.  HR in 90's and BP stable.  Patient sitting in chair eating lunch at this time.  Wife at bedside.

## 2017-05-29 NOTE — Care Management Note (Addendum)
Case Management Note  Patient Details  Name: Troy Chavez MRN: [id number] Date of Birth: 1945/11/07  Subjective/Objective:                  RNCM met with patient and his wife regarding consult for medication assistance. Plan for discharge on Brilinta.  He uses Environmental manager for medications [phone]. Patient has Medicare plan and Brilinta does not offer an assistance plan at this time.  Action/Plan: Price check with [facility] on current dose/treatment of Brilinta 13m BID for 30days - Message left specifically for pharmacist MRebeca Alertwith [employer]requesting callback. A 90-day supply will be cheapest>90$.  Expected Discharge Date:                  Expected Discharge Plan:     In-House Referral:     Discharge planning Services     Post Acute Care Choice:    Choice offered to:     DME Arranged:    DME Agency:     HH Arranged:    HH Agency:     Status of Service:     If discussed at [facility] dates discussed:    Additional Comments:  AMarshell Garfinkel RN 05/29/2017, 10:54 AM

## 2017-05-29 NOTE — Progress Notes (Signed)
PT Cancellation Note  Patient Details Name: Troy Chavez MRN: [id number] DOB: [birth year]   Cancelled Treatment:    Reason Eval/Treat Not Completed: Patient declined, reported at baseline physically and declined PT services.  Spoke to nursing who reports pt ambulating around the unit without issue and agrees with pt that PT services not appropriate.  Will complete PT orders at this time and will reassess pending a change in status upon receipt of new PT orders.    Linus Salmons PT, DPT 05/29/17, 2:41 PM

## 2017-05-29 NOTE — Progress Notes (Signed)
Progress Note  Patient Name: Troy Chavez Date of Encounter: 05/29/2017  Primary Cardiologist: New to [facility] - consult by Fletcher Anon  Subjective   No further chest pain. No SOB. Tolerating medications without issues. Troponin peaked at 21.07. Echo pending. LDL 146. Post-cath renal function improving from 2.31-->1.98. Leukocytosis resolved. HGB stable. K+ 3.7. Mg++ 1.9.   Inpatient Medications    Scheduled Meds: . amLODipine  5 mg Oral Daily  . aspirin  81 mg Oral Daily  . atorvastatin  80 mg Oral q1800  . carvedilol  3.125 mg Oral BID WC  . enoxaparin (LOVENOX) injection  30 mg Subcutaneous Q24H  . galantamine  8 mg Oral BID WC  . pantoprazole  40 mg Oral QAC breakfast  . senna  1 tablet Oral BID  . sodium chloride flush  3 mL Intravenous Q12H  . sodium chloride flush  3 mL Intravenous Q12H  . sodium chloride flush  3 mL Intravenous Q12H  . tamsulosin  0.4 mg Oral Daily  . ticagrelor  90 mg Oral BID   Continuous Infusions: . sodium chloride    . sodium chloride     PRN Meds: sodium chloride, sodium chloride, acetaminophen **OR** acetaminophen, HYDROcodone-acetaminophen, morphine injection, nitroGLYCERIN, ondansetron **OR** ondansetron (ZOFRAN) IV, polyethylene glycol, sodium chloride flush, sodium chloride flush   Vital Signs    Vitals:   05/28/17 1800 05/28/17 2000 05/29/17 0000 05/29/17 0400  BP: (!) 173/108 131/85    Pulse: 66     Resp: 17     Temp:  98.8 F (37.1 C) 98 F (36.7 C) 98.1 F (36.7 C)  TempSrc:  Oral Oral Oral  SpO2: 98%     Weight:      Height:        Intake/Output Summary (Last 24 hours) at 05/29/2017 0739 Last data filed at 05/29/2017 0534 Gross per 24 hour  Intake 471.67 ml  Output 2000 ml  Net -1528.33 ml   Filed Weights   05/28/17 1245 05/28/17 1450  Weight: 183 lb (83 kg) 185 lb 3 oz (84 kg)    Telemetry    NSR with occasional PVCs - Personally Reviewed  ECG    n/a - Personally Reviewed  Physical Exam   GEN: No acute  distress.   Neck: No JVD. Cardiac: RRR, no murmurs, rubs, or gallops. Right radial cath site without bleeding, bruising, swelling, or erythema. Radial pulse 2+.  Respiratory: Clear to auscultation bilaterally.  GI: Soft, nontender, non-distended.   MS: No edema; No deformity. Neuro:  Alert and oriented x 3; Nonfocal.  Psych: Normal affect.  Labs    Chemistry Recent Labs  Lab 05/28/17 1243 05/29/17 0242  NA 138 139  K 3.6 3.7  CL 102 107  CO2 21* 23  GLUCOSE 103* 92  BUN 26* 23*  CREATININE 2.31* 1.98*  CALCIUM 9.8 8.9  PROT 8.6*  --   ALBUMIN 4.8  --   AST 28  --   ALT 18  --   ALKPHOS 85  --   BILITOT 0.8  --   GFRNONAA 26* 32*  GFRAA 31* 37*  ANIONGAP 15 9     Hematology Recent Labs  Lab 05/28/17 1243 05/29/17 0242  WBC 11.5* 9.5  RBC 5.16 4.41  HGB 15.2 13.5  HCT 46.1 39.1*  MCV 89.3 88.7  MCH 29.5 30.5  MCHC 33.1 34.4  RDW 13.5 12.6  PLT 264 220    Cardiac Enzymes Recent Labs  Lab 05/28/17 1243 05/28/17  1453 05/28/17 2042 05/29/17 0242  TROPONINI 0.36* 3.71* 21.07* 13.31*   No results for input(s): TROPIPOC in the last 168 hours.   BNPNo results for input(s): BNP, PROBNP in the last 168 hours.   DDimer No results for input(s): DDIMER in the last 168 hours.   Radiology    No results found.  Cardiac Studies   LHC 05/28/2017: Coronary Findings   Diagnostic  Dominance: Right  Left Main  Vessel is angiographically normal.  Left Anterior Descending  Prox LAD lesion 60% stenosed  Prox LAD lesion is 60% stenosed.  Mid LAD lesion 70% stenosed  Mid LAD lesion is 70% stenosed.  Ramus Intermedius  Ost Ramus to Ramus lesion 100% stenosed  Ost Ramus to Ramus lesion is 100% stenosed. Vessel is the culprit lesion. The lesion is type B2 and thrombotic. The lesion was not previously treated.  Left Circumflex  The vessel exhibits minimal luminal irregularities.  First Obtuse Marginal Branch  The vessel exhibits minimal luminal irregularities.    Second Obtuse Marginal Branch  The vessel exhibits minimal luminal irregularities.  Third Obtuse Marginal Branch  The vessel exhibits minimal luminal irregularities.  Right Coronary Artery  Vessel is angiographically normal.  Right Posterior Descending Artery  Vessel is angiographically normal.  Right Posterior Atrioventricular Branch  Vessel is angiographically normal.  First Right Posterolateral  Second Right Posterolateral  Vessel is angiographically normal.  Intervention   Ost Ramus to Ramus lesion  Stent  Lesion length: 13 mm. Lesion crossed with guidewire using a WIRE RUNTHROUGH .[id number]. Pre-stent angioplasty was performed using a BALLOON TREK RX 2.5X12. Maximum pressure: 6 atm. Inflation time: 30 sec. A drug-eluting stent was successfully placed using a STENT SIERRA 2.50 X 15 MM. Maximum pressure: 12 atm. Inflation time: 20 sec. Post-stent angioplasty was performed using a BALLOON [facility] [id number]. Maximum pressure: 14 atm. Inflation time: 10 sec.  Post-Intervention Lesion Assessment  The intervention was successful. The guidewire was not threaded through a graft to reach the lesion. Pre-interventional TIMI flow is 0. Post-intervention TIMI flow is 3. Treated lesion length: 13 mm. No complications occurred at this lesion.  There is no residual stenosis post intervention.  Coronary Diagrams   Diagnostic Diagram       Post-Intervention Diagram        Conclusion     Prox LAD lesion is 60% stenosed.  Mid LAD lesion is 70% stenosed.  Ost Ramus to Ramus lesion is 100% stenosed.  A drug-eluting stent was successfully placed using a STENT SIERRA 2.50 X 15 MM.  Post intervention, there is a 0% residual stenosis.   1.  Significant two-vessel coronary artery disease with thrombotic occlusion of ostial ramus which is the culprit for lateral ST elevation myocardial infarction.  There is borderline significant disease in the proximal mid LAD at 60-70%. 2.  Normal left  ventricular end-diastolic pressure.  Left ventricular angiography was not performed due to chronic kidney disease. 3.  Successful angioplasty and drug-eluting stent placement to the ostial ramus.  Recommendations: 1.  Dual antiplatelet therapy for at least one year. 2.  Consider stress testing in about 6 weeks to evaluate for ischemia in the LAD distribution 3.  Obtain an echocardiogram to evaluate LV systolic function. 4.  Hydration for 12 hours to decrease the chance of contrast-induced nephropathy.  A total of 110 mL of contrast was used. 5.  Avoid catheterization via the right radial artery in the future given severe tortuosity of the innominate artery.  TTE pending.   Patient Profile     72 y.o. male with history of HTN, HLD, mild dementia and CKD stage IV who presented to [facility] on 05/28/2017 with lateral STEMI.   Assessment & Plan    1. Lateral STEMI: -No further chest pain -Status post PCI/DES to ostial ramus with residual LAD disease as noted above -DAPT with ASA and Brilinta x 12 months without interruption  -Aggressive secondary prevention -Lipitor -Coreg  -Cardiac rehab -Echo pending -Consult case manager for Brilinta card -Post cath instructions -Transfer to 2A today, will need observation for at least 24 more hours -PT -Given residual LAD disease, will plan for follow up Myoview in 4-6 weeks to assess for clinical significance of this lesion  2. HLD: -LDL 146 (not on a statin prior to admission) -Continue Lipitor -Recheck lipid and liver function in 2-3 months -If not at goal LDL < 70 at that time, consider addition of Zetia vs PCSK-9 inhibitor   3. HTN: -Improved -Continue current medications -If found to have cardiomyopathy, would discontinue amlodipine  4. CKD stage IV: -Improving with gentle IV hydration following LHC -Stop IV fluids -PO repletion   5. GERD: -PPI  For questions or updates, please contact [place] Please consult  [email or web address] for contact info under Cardiology/STEMI.    Signed, Christell Faith, PA-C [place] Pager: [phone] 05/29/2017, 7:39 AM

## 2017-05-29 NOTE — Progress Notes (Addendum)
Patient transferred from ccu to unit. Report received from [place], [place]. Post PCI. Pt oriented to room, call bell, and staff. Bed in lowest position. Fall safety plan reviewed. Telemetry box verification with tele clerk- Box#: [id number]. Will continue to monitor.

## 2017-05-30 ENCOUNTER — Telehealth: Payer: Self-pay

## 2017-05-30 ENCOUNTER — Encounter: Payer: Self-pay | Admitting: Physician Assistant

## 2017-05-30 ENCOUNTER — Other Ambulatory Visit: Payer: Self-pay | Admitting: Internal Medicine

## 2017-05-30 LAB — BASIC METABOLIC PANEL
Anion gap: 7 (ref 5–15)
BUN: 24 mg/dL — AB (ref 6–20)
CHLORIDE: 107 mmol/L (ref 101–111)
CO2: 24 mmol/L (ref 22–32)
Calcium: 8.9 mg/dL (ref 8.9–10.3)
Creatinine, Ser: 1.99 mg/dL — ABNORMAL HIGH (ref 0.61–1.24)
GFR, EST AFRICAN AMERICAN: 37 mL/min — AB (ref >60)
GFR, EST NON AFRICAN AMERICAN: 32 mL/min — AB (ref >60)
Glucose, Bld: 98 mg/dL (ref 65–99)
POTASSIUM: 3.5 mmol/L (ref 3.5–5.1)
SODIUM: 138 mmol/L (ref 135–145)

## 2017-05-30 MED ORDER — ATORVASTATIN CALCIUM 80 MG PO TABS: 80.0000 mg | ORAL_TABLET | Freq: Every day | ORAL | 0 refills | Status: DC

## 2017-05-30 MED ORDER — CARVEDILOL 3.125 MG PO TABS: 3.1250 mg | ORAL_TABLET | Freq: Two times a day (BID) | ORAL | 0 refills | Status: DC

## 2017-05-30 MED ORDER — TICAGRELOR 90 MG PO TABS: 90.0000 mg | ORAL_TABLET | Freq: Two times a day (BID) | ORAL | 0 refills | Status: DC

## 2017-05-30 MED ORDER — TICAGRELOR 90 MG PO TABS: 90.0000 mg | ORAL_TABLET | Freq: Two times a day (BID) | ORAL | 11 refills | Status: DC

## 2017-05-30 MED ORDER — ASPIRIN 81 MG PO CHEW: 81.0000 mg | CHEWABLE_TABLET | Freq: Every day | ORAL | 0 refills | Status: DC

## 2017-05-30 NOTE — Discharge Instructions (Signed)
° °  Post-cardiac catheterization care: -No lifting, pulling, or pushing items greater than 5 pounds for the next 1 week. -No intercourse for 1 week. -No baths or swimming for 1 week.  -Showers are ok. -Wash cardiac cath site with soap and water, let air dry, can keep a Band-Aid over the site -Call for bleeding, bruising, swelling, warmth, or redness of the cardiac cath site/forearm. -No driving for 2 weeks.  -Slowly advance activity as tolerated around the house over the next couple weeks within the above guidelines. -Call if any questions or have difficulty with medications.

## 2017-05-30 NOTE — Progress Notes (Signed)
Spoke with [facility] this evening to verify the patient had picked up his Brilinta. Patient did pick up his Brilinta.

## 2017-05-30 NOTE — Telephone Encounter (Signed)
TCM....  Patient is being discharged   They saw Dr Fletcher Anon  They are scheduled to see R. Dunn on 2/11  They were seen for acute lateral ST elevation myocardial infarction  They need to be seen within 1 week  Pt added to wait list   Please call

## 2017-05-30 NOTE — Progress Notes (Signed)
  Progress Note  Patient Name: Troy Chavez Date of Encounter: 05/30/2017  Primary Cardiologist: New to [facility] - consult by Arida  Subjective   Feels well this morning.  No further chest pain or shortness of breath.  Troy Chavez ambulated in the hallway yesterday without difficulty.  He is tolerating current medications well, including aspirin and ticagrelor.  Inpatient Medications    Scheduled Meds: . amLODipine  5 mg Oral Daily  . aspirin  81 mg Oral Daily  . atorvastatin  80 mg Oral q1800  . carvedilol  3.125 mg Oral BID WC  . enoxaparin (LOVENOX) injection  30 mg Subcutaneous Q24H  . galantamine  8 mg Oral BID WC  . pantoprazole  40 mg Oral QAC breakfast  . senna  1 tablet Oral BID  . sodium chloride flush  3 mL Intravenous Q12H  . sodium chloride flush  3 mL Intravenous Q12H  . sodium chloride flush  3 mL Intravenous Q12H  . tamsulosin  0.4 mg Oral Daily  . ticagrelor  90 mg Oral BID   Continuous Infusions: . sodium chloride    . sodium chloride     PRN Meds: sodium chloride, sodium chloride, acetaminophen **OR** acetaminophen, HYDROcodone-acetaminophen, morphine injection, nitroGLYCERIN, ondansetron **OR** ondansetron (ZOFRAN) IV, polyethylene glycol, sodium chloride flush, sodium chloride flush   Vital Signs    Vitals:   05/29/17 1932 05/30/17 0516 05/30/17 0747 05/30/17 0831  BP: 122/79 111/76 139/88 (!) 133/98  Pulse: 69 73 80 70  Resp: 18 18  18  Temp: 98.2 F (36.8 C) 98.4 F (36.9 C) 98.1 F (36.7 C) 98 F (36.7 C)  TempSrc: Oral Oral Oral Oral  SpO2: 98% 97% 99% 97%  Weight:      Height:        Intake/Output Summary (Last 24 hours) at 05/30/2017 0931 Last data filed at 05/30/2017 0517 Gross per 24 hour  Intake 610 ml  Output 700 ml  Net -90 ml   Filed Weights   05/28/17 1245 05/28/17 1450  Weight: 183 lb (83 kg) 185 lb 3 oz (84 kg)    Telemetry    NSR with short episodes of sinus tachycardia with rates into the low 100s bpm - Personally  Reviewed  ECG    n/a - Personally Reviewed  Physical Exam   GEN: No acute distress.   Neck: No JVD. Cardiac: RRR, no murmurs, rubs, or gallops. Right radial cardiac cath site without bleeding, bruising, swelling, erythema, or TTP. Radial pulse 2+.  Respiratory: Clear to auscultation bilaterally.  GI: Soft, nontender, non-distended.   MS: No edema; No deformity. Neuro:  Alert and oriented x 3; Nonfocal.  Psych: Normal affect.  Labs    Chemistry Recent Labs  Lab 05/28/17 1243 05/29/17 0242 05/30/17 0335  NA 138 139 138  K 3.6 3.7 3.5  CL 102 107 107  CO2 21* 23 24  GLUCOSE 103* 92 98  BUN 26* 23* 24*  CREATININE 2.31* 1.98* 1.99*  CALCIUM 9.8 8.9 8.9  PROT 8.6*  --   --   ALBUMIN 4.8  --   --   AST 28  --   --   ALT 18  --   --   ALKPHOS 85  --   --   BILITOT 0.8  --   --   GFRNONAA 26* 32* 32*  GFRAA 31* 37* 37*  ANIONGAP 15 9 7     Hematology Recent Labs  Lab 05/28/17 1243 05/29/17 0242    WBC 11.5* 9.5  RBC 5.16 4.41  HGB 15.2 13.5  HCT 46.1 39.1*  MCV 89.3 88.7  MCH 29.5 30.5  MCHC 33.1 34.4  RDW 13.5 12.6  PLT 264 220    Cardiac Enzymes Recent Labs  Lab 05/28/17 1243 05/28/17 1453 05/28/17 2042 05/29/17 0242  TROPONINI 0.36* 3.71* 21.07* 13.31*   No results for input(s): TROPIPOC in the last 168 hours.   BNPNo results for input(s): BNP, PROBNP in the last 168 hours.   DDimer No results for input(s): DDIMER in the last 168 hours.   Radiology    No results found.  Cardiac Studies   LHC 05/28/2017: Coronary Findings   Diagnostic  Dominance: Right  Left Main  Vessel is angiographically normal.  Left Anterior Descending  Prox LAD lesion 60% stenosed  Prox LAD lesion is 60% stenosed.  Mid LAD lesion 70% stenosed  Mid LAD lesion is 70% stenosed.  Ramus Intermedius  Ost Ramus to Ramus lesion 100% stenosed  Ost Ramus to Ramus lesion is 100% stenosed. Vessel is the culprit lesion. The lesion is type B2 and thrombotic. The lesion was  not previously treated.  Left Circumflex  The vessel exhibits minimal luminal irregularities.  First Obtuse Marginal Branch  The vessel exhibits minimal luminal irregularities.  Second Obtuse Marginal Branch  The vessel exhibits minimal luminal irregularities.  Third Obtuse Marginal Branch  The vessel exhibits minimal luminal irregularities.  Right Coronary Artery  Vessel is angiographically normal.  Right Posterior Descending Artery  Vessel is angiographically normal.  Right Posterior Atrioventricular Branch  Vessel is angiographically normal.  First Right Posterolateral  Second Right Posterolateral  Vessel is angiographically normal.  Intervention   Ost Ramus to Ramus lesion  Stent  Lesion length: 13 mm. Lesion crossed with guidewire using a WIRE RUNTHROUGH .[id number]. Pre-stent angioplasty was performed using a BALLOON TREK RX 2.5X12. Maximum pressure: 6 atm. Inflation time: 30 sec. A drug-eluting stent was successfully placed using a STENT SIERRA 2.50 X 15 MM. Maximum pressure: 12 atm. Inflation time: 20 sec. Post-stent angioplasty was performed using a BALLOON Alderson Stanwood [id number]. Maximum pressure: 14 atm. Inflation time: 10 sec.  Post-Intervention Lesion Assessment  The intervention was successful. The guidewire was not threaded through a graft to reach the lesion. Pre-interventional TIMI flow is 0. Post-intervention TIMI flow is 3. Treated lesion length: 13 mm. No complications occurred at this lesion.  There is no residual stenosis post intervention.  Coronary Diagrams   Diagnostic Diagram       Post-Intervention Diagram        Conclusion     Prox LAD lesion is 60% stenosed.  Mid LAD lesion is 70% stenosed.  Ost Ramus to Ramus lesion is 100% stenosed.  A drug-eluting stent was successfully placed using a STENT SIERRA 2.50 X 15 MM.  Post intervention, there is a 0% residual stenosis.  1. Significant two-vessel coronary artery disease with thrombotic  occlusion of ostial ramus which is the culprit for lateral ST elevation myocardial infarction. There is borderline significant disease in the proximal mid LAD at 60-70%. 2. Normal left ventricular Thaxton Pelley-diastolic pressure. Left ventricular angiography was not performed due to chronic kidney disease. 3. Successful angioplasty and drug-eluting stent placement to the ostial ramus.  Recommendations: 1. Dual antiplatelet therapy for at least one year. 2. Consider stress testing in about 6 weeks to evaluate for ischemia in the LAD distribution 3. Obtain an echocardiogram to evaluate LV systolic function. 4. Hydration for 12 hours to decrease the  chance of contrast-induced nephropathy. A total of 110 mL of contrast was used. 5. Avoid catheterization via the right radial artery in the future given severe tortuosity of the innominate artery.   TTE 05/29/2017: Study Conclusions  - Left ventricle: The cavity size was normal. Wall thickness was   normal. Systolic function was mildly reduced. The estimated   ejection fraction was in the range of 45% to 50%. There is   hypokinesis of the anterior and anterolateral myocardium. Doppler   parameters are consistent with abnormal left ventricular   relaxation (grade 1 diastolic dysfunction). - Aortic root: The aortic root was mildly dilated. - Ascending aorta: The ascending aorta was mildly dilated. - Right ventricle: The cavity size was normal. Systolic function   was normal. - Pulmonary arteries: Systolic pressure was within the normal   range, in the range of 25 mm Hg to 30 mm Hg.  Patient Profile     71 y.o. male with history of HTN, HLD, mild dementia and CKD stage IV who presented to [facility] on 05/28/2017 with lateral STEMI.   Assessment & Plan    1. Lateral STEMI: Patient has recovered well without further symptoms following primary PCI to ramus intermedius.  Echo shows mildly reduced left ventricular contraction with anterior and  anterolateral hypokinesis.  Continue aspirin and ticagrelor for at least 12 months.  If there are cost issues associated with ticagrelor, transition to clopidogrel after 30 days could be considered.  Aggressive secondary prevention including high intensity statin therapy.  Plan for functional study in 1-2 months to assess for significance of at least moderate LAD disease noted at the time of catheterization.  Continue carvedilol 3.125 mg twice daily.  Borderline low resting heart rate precludes further up titration at this time.  2.  Ischemic cardiomyopathy Mildly reduced LVEF noted by echo.  Troy Chavez appears euvolemic and well compensated.  Continue current dose of carvedilol.  ACE inhibitor/ARB not started at this time secondary to chronic kidney disease.  This could be readdressed as an outpatient if renal function remains stable.  3.  Hyperlipidemia LDL goal less than 70 (46 on admission).  Continue atorvastatin 80 mg daily.  Repeat lipid panel on 2-3 months.  4.  Hypertension Blood pressure normal to mildly elevated.  Continue current doses of carvedilol and amlodipine; further adjustments may be necessary as an outpatient.  5. CKD stage IV:  Renal function stable this morning on recheck.  Avoid nephrotoxic drugs.  Continue blood pressure control and outpatient follow-up.  6. GERD: Currently no symptoms.  Continue pantoprazole.  Disposition: Patient is appropriate for discharge today.  He should follow-up in our office in 1-2 weeks.  Referral for outpatient cardiac rehab will also be placed.  For questions or updates, please contact [facility] Please consult [email or web address] for contact info under [facility]    , MD [facility] Pager: [phone] 05/30/2017, 9:31 AM  

## 2017-05-30 NOTE — Care Management (Signed)
Patient to discharge home today.  Patient provided with 30 day Brilinta care prior to discharge.  Per bedside RN no further needs at discharge.

## 2017-05-30 NOTE — Progress Notes (Signed)
Went over discharge instructions with the patient and family including medication and follow-up appointment. Discontinue peripheral IV and telemetry monitor. Optometrist for transport.

## 2017-05-30 NOTE — Telephone Encounter (Signed)
Patient contacted regarding discharge from [facility] on 05/30/17.  Patient understands to follow up with provider Dr. Fletcher Anon on Jan 31 at 10:20am at [facility]. Patient understands discharge instructions? yes Patient understands medications and regiment? yes Patient understands to bring all medications to this visit? yes  Per pt request, I reviewed information with his wife who had no questions at this time.

## 2017-05-30 NOTE — Care Management Important Message (Signed)
Important Message  Patient Details  Name: Troy Chavez MRN: [id number] Date of Birth: 07-13-1945   Medicare Important Message Given:  N/A - LOS <3 / Initial given by admissions    Beverly Sessions, RN 05/30/2017, 1:56 PM

## 2017-05-30 NOTE — Telephone Encounter (Signed)
Pt currently admitted. Routed message to Gabriel Cirri to switch appointment to Jan 31 at 10:20am with Dr. Fletcher Anon.

## 2017-06-05 DIAGNOSIS — M109 Gout, unspecified: Secondary | ICD-10-CM | POA: Diagnosis not present

## 2017-06-05 DIAGNOSIS — I252 Old myocardial infarction: Secondary | ICD-10-CM

## 2017-06-05 DIAGNOSIS — I2109 ST elevation (STEMI) myocardial infarction involving other coronary artery of anterior wall: Secondary | ICD-10-CM | POA: Diagnosis not present

## 2017-06-05 DIAGNOSIS — N184 Chronic kidney disease, stage 4 (severe): Secondary | ICD-10-CM | POA: Diagnosis not present

## 2017-06-05 HISTORY — DX: Old myocardial infarction: I25.2

## 2017-06-05 NOTE — Discharge Summary (Signed)
Troy Chavez at [place] NAME: Troy Chavez    MR#:  [id number]  DATE OF BIRTH:  03-Aug-1945  DATE OF ADMISSION:  05/28/2017 ADMITTING PHYSICIAN: Gorden Harms, MD  DATE OF DISCHARGE: 05/30/2017  1:58 PM  PRIMARY CARE PHYSICIAN: Rusty Aus, MD    ADMISSION DIAGNOSIS:  ST elevation myocardial infarction (STEMI), unspecified artery ([place]) [I21.3] STEMI (ST elevation myocardial infarction) ([place]) [I21.3]  DISCHARGE DIAGNOSIS:  Active Problems:   Acute ST elevation myocardial infarction (STEMI) of lateral wall ([facility])   STEMI (ST elevation myocardial infarction) ([place])   SECONDARY DIAGNOSIS:   Past Medical History:  Diagnosis Date  . Alzheimer's disease   . CKD (chronic kidney disease), stage IV ([place])   . Hypertension     HOSPITAL COURSE:   72 year old male with essential hypertension and chronic kidney disease stage IV who presented with chest pain and found to have ST elevation MI.   1. Lateral ST elevation MI due to occlusion of rami intermedius status post EYC:[id number] max 21.07 Continue dual antiplatelet therapy with aspirin and Brillanta Continue Coreg and Norvasc Consider functional study 1-2 months to evaluate symptoms as well as ischemic burden. He has moderate to severe residual disease involving LAD Follow with cardiology clinic.  2. Essential hypertension: Continue Norvasc and Coreg  3. Hyperlipidemia LDL 146 not at goal: Continue Lipitor Repeat lipid panel in 2 months  4. CKD stage 4:  Creatinine improved  Recheck in am   DISCHARGE CONDITIONS:   Stable.  CONSULTS OBTAINED:  Treatment Team:  Salary, Avel Peace, MD Wellington Hampshire, MD  DRUG ALLERGIES:  No Known Allergies  DISCHARGE MEDICATIONS:   Allergies as of 05/30/2017   No Known Allergies     Medication List    TAKE these medications   amLODipine 5 MG tablet Commonly known as:  NORVASC Take 5 mg by mouth 2 (two) times daily.    aspirin 81 MG chewable tablet Chew 1 tablet (81 mg total) by mouth daily.   atorvastatin 80 MG tablet Commonly known as:  LIPITOR Take 1 tablet (80 mg total) by mouth daily at 6 PM. What changed:    medication strength  how much to take  when to take this   carvedilol 3.125 MG tablet Commonly known as:  COREG Take 1 tablet (3.125 mg total) by mouth 2 (two) times daily with a meal.   galantamine 8 MG tablet Commonly known as:  RAZADYNE Take 8 mg by mouth 2 (two) times daily.   pantoprazole 40 MG tablet Commonly known as:  PROTONIX Take 1 tablet (40 mg total) by mouth daily.   tamsulosin 0.4 MG Caps capsule Commonly known as:  FLOMAX Take 0.4 mg by mouth daily.   ticagrelor 90 MG Tabs tablet Commonly known as:  BRILINTA Take 1 tablet (90 mg total) by mouth 2 (two) times daily.   ticagrelor 90 MG Tabs tablet Commonly known as:  BRILINTA Take 1 tablet (90 mg total) by mouth 2 (two) times daily.        DISCHARGE INSTRUCTIONS:    Follow with cardiology clinic in 2 weeks.  If you experience worsening of your admission symptoms, develop shortness of breath, life threatening emergency, suicidal or homicidal thoughts you must seek medical attention immediately by calling 911 or calling your MD immediately  if symptoms less severe.  You Must read complete instructions/literature along with all the possible adverse reactions/side effects for all the Medicines you take and that  have been prescribed to you. Take any new Medicines after you have completely understood and accept all the possible adverse reactions/side effects.   Please note  You were cared for by a hospitalist during your hospital stay. If you have any questions about your discharge medications or the care you received while you were in the hospital after you are discharged, you can call the unit and asked to speak with the hospitalist on call if the hospitalist that took care of you is not available. Once you  are discharged, your primary care physician will handle any further medical issues. Please note that NO REFILLS for any discharge medications will be authorized once you are discharged, as it is imperative that you return to your primary care physician (or establish a relationship with a primary care physician if you do not have one) for your aftercare needs so that they can reassess your need for medications and monitor your lab values.    Today   CHIEF COMPLAINT:   Chief Complaint  Patient presents with  . Code STEMI    HISTORY OF PRESENT ILLNESS:  Troy Chavez  is a 72 y.o. male with a known history of presents with an episode of chest pain.  Patient states this started shortly after dinnertime, and initially he thought it was indigestion.  He states that he has had recurrent episodes of indigestion in the past.  The pain initially eased, but then came back much stronger than it ever had in the past.  He came to the ED for evaluation.  Here he was found to have troponin elevated at 0.08, and subsequently at 0.12.  At that point hospitalist were called for admission and further evaluation     VITAL SIGNS:  Blood pressure (!) 133/98, pulse 70, temperature 98 F (36.7 C), temperature source Oral, resp. rate 18, height 5\' 9"  (1.753 m), weight 84 kg (185 lb 3 oz), SpO2 97 %.  I/O:  No intake or output data in the 24 hours ending 06/05/17 1105  PHYSICAL EXAMINATION:  GENERAL:  72 y.o.-year-old patient lying in the bed with no acute distress.  EYES: Pupils equal, round, reactive to light and accommodation. No scleral icterus. Extraocular muscles intact.  HEENT: Head atraumatic, normocephalic. Oropharynx and nasopharynx clear.  NECK:  Supple, no jugular venous distention. No thyroid enlargement, no tenderness.  LUNGS: Normal breath sounds bilaterally, no wheezing, rales,rhonchi or crepitation. No use of accessory muscles of respiration.  CARDIOVASCULAR: S1, S2 normal. No murmurs, rubs, or  gallops.  ABDOMEN: Soft, non-tender, non-distended. Bowel sounds present. No organomegaly or mass.  EXTREMITIES: No pedal edema, cyanosis, or clubbing.  NEUROLOGIC: Cranial nerves II through XII are intact. Muscle strength 5/5 in all extremities. Sensation intact. Gait not checked.  PSYCHIATRIC: The patient is alert and oriented x 3.  SKIN: No obvious rash, lesion, or ulcer.   DATA REVIEW:   CBC No results for input(s): WBC, HGB, HCT, PLT in the last 168 hours.  Chemistries  Recent Labs  Lab 05/30/17 0335  NA 138  K 3.5  CL 107  CO2 24  GLUCOSE 98  BUN 24*  CREATININE 1.99*  CALCIUM 8.9    Cardiac Enzymes No results for input(s): TROPONINI in the last 168 hours.  Microbiology Results  Results for orders placed or performed during the hospital encounter of 05/28/17  MRSA PCR Screening     Status: None   Collection Time: 05/28/17  2:35 PM  Result Value Ref Range Status   MRSA  by PCR NEGATIVE NEGATIVE Final    Comment:        The GeneXpert MRSA Assay (FDA approved for NASAL specimens only), is one component of a comprehensive MRSA colonization surveillance program. It is not intended to diagnose MRSA infection nor to guide or monitor treatment for MRSA infections. Performed at [facility], [address] [place], [place] [address]     RADIOLOGY:  No results found.  EKG:   Orders placed or performed during the hospital encounter of 05/28/17  . EKG 12-Lead immediately post procedure  . EKG 12-Lead  . EKG 12-Lead immediately post procedure  . EKG 12-Lead  . EKG      Management plans discussed with the patient, family and they are in agreement.  CODE STATUS:  Code Status History    Date Active Date Inactive Code Status Order ID Comments User Context   05/28/2017 14:47 05/30/2017 16:58 Full Code [id number]  Gorden Harms, MD Inpatient   05/28/2017 14:47 05/28/2017 14:47 Full Code [id number]  Wellington Hampshire, MD Inpatient   05/07/2017 00:34  05/07/2017 18:20 Full Code [id number]  Lance Coon, MD Inpatient    Advance Directive Documentation     Most Recent Value  Type of Advance Directive  Living will, Healthcare Power of Attorney  Pre-existing out of facility DNR order (yellow form or pink MOST form)  No data  "MOST" Form in Place?  No data      TOTAL TIME TAKING CARE OF THIS PATIENT: 35 minutes.    Vaughan Basta M.D on 06/05/2017 at 11:05 AM  Between 7am to 6pm - Pager - [phone]  After 6pm go to [email or web address] - password [email or web address] [facility]  Office  [phone]  CC: Primary care physician; Rusty Aus, MD   Note: This dictation was prepared with Dragon dictation along with smaller phrase technology. Any transcriptional errors that result from this process are unintentional.

## 2017-06-07 ENCOUNTER — Encounter: Payer: Self-pay | Admitting: Cardiovascular Disease

## 2017-06-07 ENCOUNTER — Ambulatory Visit: Payer: PPO | Admitting: Cardiovascular Disease

## 2017-06-07 VITALS — BP 112/76 | HR 54 | Ht 69.0 in | Wt 181.2 lb

## 2017-06-07 DIAGNOSIS — I255 Ischemic cardiomyopathy: Secondary | ICD-10-CM | POA: Diagnosis not present

## 2017-06-07 DIAGNOSIS — I2129 ST elevation (STEMI) myocardial infarction involving other sites: Secondary | ICD-10-CM

## 2017-06-07 DIAGNOSIS — I10 Essential (primary) hypertension: Secondary | ICD-10-CM | POA: Diagnosis not present

## 2017-06-07 DIAGNOSIS — E7800 Pure hypercholesterolemia, unspecified: Secondary | ICD-10-CM | POA: Diagnosis not present

## 2017-06-07 NOTE — Progress Notes (Signed)
Cardiology Office Note   Date:  06/07/2017   ID:  Troy Chavez, DOB [birth year], MRN [id number]  PCP:  Rusty Aus, MD  Cardiologist:   Kathlyn Sacramento, MD   Chief Complaint  Patient presents with  . Other    1 week follow up.  f/u for acute lateral ST elevation myocardial infarction. Meds reviewed verbally with patient.       History of Present Illness: Troy Chavez is a 72 y.o. male who presents for a follow-up after recent hospitalization for lateral ST elevation myocardial infarction. He has known history of hypertension, hyperlipidemia, mild dementia and stage IV chronic kidney disease. He presented on January 21 with severe substernal chest pain.  He was found to have lateral ST elevation in his EKG.  Interestingly, the patient was hospitalized 3 weeks prior to that with chest pain and mildly elevated troponin at 0.28.  He did not have cardiac workup at that time. 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  There was also 60% stenosis in the proximal LAD and 70% stenosis in the mid LAD. I performed successful angioplasty and drug-eluting stent placement to the ramus without complications.  Catheterization via the right radial artery was difficult due to severe tortuosity of the innominate artery. Echocardiogram showed an EF of 45-50%.  Renal function remains stable with contrast exposure.  He had a creatinine checked on the 29th which was 2.1. He has been doing extremely well and denies any chest pain, shortness of breath or palpitations.  He complains of mild dizziness when standing up.  No side effects with medications.  Past Medical History:  Diagnosis Date  . Alzheimer's disease   . CKD (chronic kidney disease), stage IV ([place])   . Hypertension     Past Surgical History:  Procedure Laterality Date  . CORONARY/GRAFT ACUTE MI REVASCULARIZATION N/A 05/28/2017   Procedure: Coronary/Graft Acute MI Revascularization;  Surgeon: Wellington Hampshire, MD;  Location: [facility];  Service: Cardiovascular;  Laterality: N/A;  . LEFT HEART CATH AND CORONARY ANGIOGRAPHY N/A 05/28/2017   Procedure: LEFT HEART CATH AND CORONARY ANGIOGRAPHY;  Surgeon: Wellington Hampshire, MD;  Location: [facility];  Service: Cardiovascular;  Laterality: N/A;     Current Outpatient Medications  Medication Sig Dispense Refill  . amLODipine (NORVASC) 5 MG tablet Take 5 mg by mouth 2 (two) times daily.  11  . aspirin 81 MG chewable tablet Chew 1 tablet (81 mg total) by mouth daily. 30 tablet 0  . atorvastatin (LIPITOR) 80 MG tablet Take 1 tablet (80 mg total) by mouth daily at 6 PM. 30 tablet 0  . carvedilol (COREG) 3.125 MG tablet Take 1 tablet (3.125 mg total) by mouth 2 (two) times daily with a meal. 60 tablet 0  . galantamine (RAZADYNE) 8 MG tablet Take 8 mg by mouth 2 (two) times daily.  11  . pantoprazole (PROTONIX) 40 MG tablet Take 1 tablet (40 mg total) by mouth daily. 60 tablet 0  . tamsulosin (FLOMAX) 0.4 MG CAPS capsule Take 0.4 mg by mouth daily.    . ticagrelor (BRILINTA) 90 MG TABS tablet Take 1 tablet (90 mg total) by mouth 2 (two) times daily. 60 tablet 11   No current facility-administered medications for this visit.     Allergies:   Patient has no known allergies.    Social History:  The patient  reports that  he has quit smoking. he has never used smokeless tobacco. He reports that he drinks alcohol. He reports that he does not use drugs.   Family History:  The patient's family history is not on file.    ROS:  Please see the history of present illness.   Otherwise, review of systems are positive for none.   All other systems are reviewed and negative.    PHYSICAL EXAM: VS:  BP 112/76 (BP Location: Left Arm, Patient Position: Sitting, Cuff Size: Normal)   Pulse (!) 54   Ht 5\' 9"  (1.753 m)   Wt 181 lb 4 oz (82.2 kg)   BMI 26.77 kg/m  , BMI  Body mass index is 26.77 kg/m. GEN: Well nourished, well developed, in no acute distress  HEENT: normal  Neck: no JVD, carotid bruits, or masses Cardiac: RRR; no murmurs, rubs, or gallops,no edema  Respiratory:  clear to auscultation bilaterally, normal work of breathing GI: soft, nontender, nondistended, + BS MS: no deformity or atrophy  Skin: warm and dry, no rash Neuro:  Strength and sensation are intact Psych: euthymic mood, full affect Right radial pulse is normal with no hematoma.  Mild ecchymosis.  EKG:  EKG is ordered today. The ekg ordered today demonstrates sinus bradycardia with a PVC.  Lateral T wave changes suggestive of ischemia.   Recent Labs: 05/28/2017: ALT 18 05/29/2017: Hemoglobin 13.5; Magnesium 1.9; Platelets 220 05/30/2017: BUN 24; Creatinine, Ser 1.99; Potassium 3.5; Sodium 138    Lipid Panel    Component Value Date/Time   CHOL 214 (H) 05/29/2017 0242   TRIG 82 05/29/2017 0242   HDL 52 05/29/2017 0242   CHOLHDL 4.1 05/29/2017 0242   VLDL 16 05/29/2017 0242   LDLCALC 146 (H) 05/29/2017 0242      Wt Readings from Last 3 Encounters:  06/07/17 181 lb 4 oz (82.2 kg)  05/28/17 185 lb 3 oz (84 kg)  05/07/17 183 lb 12.8 oz (83.4 kg)       No flowsheet data found.    ASSESSMENT AND PLAN:  1.  Status post recent lateral ST elevation myocardial infarction: Treated successfully with PCI and drug-eluting stent placement to the ramus: Continue dual antiplatelet therapy for at least one year.  Continue aggressive treatment of risk factors. I referred the patient to cardiac rehab which can be started  now. He does have borderline significant disease in the LAD but I suspect that this can likely be managed medically especially with no anginal symptoms. I am planning to perform a stress test on him in about 6 weeks from now.  2.  Mild ischemic cardiomyopathy: Ejection fraction was 45-50%: Continue small dose carvedilol.  The dose cannot be increased due to  bradycardia.  He is not on ACE inhibitor or ARB due to his stage IV chronic kidney disease.  3.  Essential hypertension: Blood pressure is controlled on amlodipine and carvedilol.  4.  Hyperlipidemia: The patient's lipid profile while hospitalized showed an LDL of 146.  Since then, the dose of atorvastatin was increased to 80.  I requested repeat lipid and liver profile in 1 month.  Disposition:   FU with me in 1 month  Signed,  Kathlyn Sacramento, MD  06/07/2017 12:56 PM    [place]

## 2017-06-07 NOTE — Patient Instructions (Addendum)
Medication Instructions:  Your physician recommends that you continue on your current medications as directed. Please refer to the Current Medication list given to you today.   Labwork: Fasting lipid and liver profile in one month. Nothing to eat or drink after midnight the evening before your labs.   Testing/Procedures: none  Follow-Up: Your physician recommends that you schedule a follow-up appointment in: 1 month with Dr. Fletcher Anon.    Any Other Special Instructions Will Be Listed Below (If Applicable).  You have been cleared to start Cardiac Rehab  [phone] Cardiac Rehab appt Monday, Feb 4, 10:30am  If you need a refill on your cardiac medications before your next appointment, please call your pharmacy.

## 2017-06-11 DIAGNOSIS — S9031XA Contusion of right foot, initial encounter: Secondary | ICD-10-CM | POA: Diagnosis not present

## 2017-06-18 ENCOUNTER — Encounter: Payer: PPO | Attending: Cardiovascular Disease | Admitting: *Deleted

## 2017-06-18 ENCOUNTER — Encounter: Payer: Self-pay | Admitting: *Deleted

## 2017-06-18 ENCOUNTER — Ambulatory Visit: Payer: PPO | Admitting: Physician Assistant

## 2017-06-18 VITALS — Ht 68.9 in | Wt 181.3 lb

## 2017-06-18 DIAGNOSIS — Z955 Presence of coronary angioplasty implant and graft: Secondary | ICD-10-CM | POA: Diagnosis not present

## 2017-06-18 DIAGNOSIS — I213 ST elevation (STEMI) myocardial infarction of unspecified site: Secondary | ICD-10-CM | POA: Diagnosis not present

## 2017-06-18 NOTE — Patient Instructions (Signed)
Patient Instructions  Patient Details  Name: Troy Chavez MRN: [id number] Date of Birth: 18-Jun-1945 Referring Provider:  Wellington Hampshire, MD  Below are your personal goals for exercise, nutrition, and risk factors. Our goal is to help you stay on track towards obtaining and maintaining these goals. We will be discussing your progress on these goals with you throughout the program.  Initial Exercise Prescription: Initial Exercise Prescription - 06/18/17 1400      Date of Initial Exercise RX and Referring Provider   Date  06/18/17    Referring Provider  Kathlyn Sacramento MD      Treadmill   MPH  3    Grade  0.5    Minutes  15    METs  3.5      Recumbant Bike   Level  3    RPM  40    Watts  36    Minutes  15    METs  3.5      REL-XR   Level  2    Speed  50    Minutes  15    METs  3.5      Prescription Details   Frequency (times per week)  3    Duration  Progress to 45 minutes of aerobic exercise without signs/symptoms of physical distress      Intensity   THRR 40-80% of Max Heartrate  95-130    Ratings of Perceived Exertion  11-13    Perceived Dyspnea  0-4      Progression   Progression  Continue to progress workloads to maintain intensity without signs/symptoms of physical distress.      Resistance Training   Training Prescription  Yes    Weight  4 lbs    Reps  10-15       Exercise Goals: Frequency: Be able to perform aerobic exercise two to three times per week in program working toward 2-5 days per week of home exercise.  Intensity: Work with a perceived exertion of 11 (fairly light) - 15 (hard) while following your exercise prescription.  We will make changes to your prescription with you as you progress through the program.   Duration: Be able to do 30 to 45 minutes of continuous aerobic exercise in addition to a 5 minute warm-up and a 5 minute cool-down routine.   Nutrition Goals: Your personal nutrition goals will be established when you do your  nutrition analysis with the dietician.  The following are general nutrition guidelines to follow: Cholesterol < 200mg /day Sodium < 1500mg /day Fiber: Men over 50 yrs - 30 grams per day  Personal Goals: Personal Goals and Risk Factors at Admission - 06/18/17 1301      Core Components/Risk Factors/Patient Goals on Admission    Weight Management  Yes;Weight Loss    Intervention  Weight Management: Develop a combined nutrition and exercise program designed to reach desired caloric intake, while maintaining appropriate intake of nutrient and fiber, sodium and fats, and appropriate energy expenditure required for the weight goal.;Weight Management: Provide education and appropriate resources to help participant work on and attain dietary goals.    Admit Weight  181 lb 4.8 oz (82.2 kg)    Goal Weight: Short Term  176 lb (79.8 kg)    Goal Weight: Long Term  171 lb (77.6 kg)    Expected Outcomes  Short Term: Continue to assess and modify interventions until short term weight is achieved;Long Term: Adherence to nutrition and physical activity/exercise program aimed toward  attainment of established weight goal;Weight Loss: Understanding of general recommendations for a balanced deficit meal plan, which promotes 1-2 lb weight loss per week and includes a negative energy balance of [phone] kcal/d;Understanding recommendations for meals to include 15-35% energy as protein, 25-35% energy from fat, 35-60% energy from carbohydrates, less than 200mg  of dietary cholesterol, 20-35 gm of total fiber daily;Understanding of distribution of calorie intake throughout the day with the consumption of 4-5 meals/snacks    Hypertension  Yes    Intervention  Provide education on lifestyle modifcations including regular physical activity/exercise, weight management, moderate sodium restriction and increased consumption of fresh fruit, vegetables, and low fat dairy, alcohol moderation, and smoking cessation.;Monitor prescription  use compliance.    Expected Outcomes  Short Term: Continued assessment and intervention until BP is < 140/67mm HG in hypertensive participants. < 130/17mm HG in hypertensive participants with diabetes, heart failure or chronic kidney disease.;Long Term: Maintenance of blood pressure at goal levels.    Lipids  Yes    Intervention  Provide education and support for participant on nutrition & aerobic/resistive exercise along with prescribed medications to achieve LDL 70mg , HDL >40mg .    Expected Outcomes  Short Term: Participant states understanding of desired cholesterol values and is compliant with medications prescribed. Participant is following exercise prescription and nutrition guidelines.;Long Term: Cholesterol controlled with medications as prescribed, with individualized exercise RX and with personalized nutrition plan. Value goals: LDL < 70mg , HDL > 40 mg.       Tobacco Use Initial Evaluation: Social History   Tobacco Use  Smoking Status Former Smoker  Smokeless Tobacco Never Used  Tobacco Comment   Quit 35 yeaers ago    Exercise Goals and Review: Exercise Goals    Row Name 06/18/17 1428             Exercise Goals   Increase Physical Activity  Yes       Intervention  Provide advice, education, support and counseling about physical activity/exercise needs.;Develop an individualized exercise prescription for aerobic and resistive training based on initial evaluation findings, risk stratification, comorbidities and participant's personal goals.       Expected Outcomes  Short Term: Attend rehab on a regular basis to increase amount of physical activity.;Long Term: Add in home exercise to make exercise part of routine and to increase amount of physical activity.;Long Term: Exercising regularly at least 3-5 days a week.       Increase Strength and Stamina  Yes       Intervention  Provide advice, education, support and counseling about physical activity/exercise needs.;Develop an  individualized exercise prescription for aerobic and resistive training based on initial evaluation findings, risk stratification, comorbidities and participant's personal goals.       Expected Outcomes  Short Term: Increase workloads from initial exercise prescription for resistance, speed, and METs.;Short Term: Perform resistance training exercises routinely during rehab and add in resistance training at home;Long Term: Improve cardiorespiratory fitness, muscular endurance and strength as measured by increased METs and functional capacity (6MWT)       Able to understand and use rate of perceived exertion (RPE) scale  Yes       Intervention  Provide education and explanation on how to use RPE scale       Expected Outcomes  Short Term: Able to use RPE daily in rehab to express subjective intensity level;Long Term:  Able to use RPE to guide intensity level when exercising independently       Knowledge and understanding of  Target Heart Rate Range (THRR)  Yes       Intervention  Provide education and explanation of THRR including how the numbers were predicted and where they are located for reference       Expected Outcomes  Short Term: Able to state/look up THRR;Long Term: Able to use THRR to govern intensity when exercising independently;Short Term: Able to use daily as guideline for intensity in rehab       Able to check pulse independently  Yes       Intervention  Provide education and demonstration on how to check pulse in carotid and radial arteries.;Review the importance of being able to check your own pulse for safety during independent exercise       Expected Outcomes  Short Term: Able to explain why pulse checking is important during independent exercise;Long Term: Able to check pulse independently and accurately       Understanding of Exercise Prescription  Yes       Intervention  Provide education, explanation, and written materials on patient's individual exercise prescription       Expected  Outcomes  Short Term: Able to explain program exercise prescription;Long Term: Able to explain home exercise prescription to exercise independently          Copy of goals given to participant.

## 2017-06-18 NOTE — Progress Notes (Signed)
Cardiac Individual Treatment Plan  Patient Details  Name: Troy Chavez MRN: [id number] Date of Birth: [birth year] Referring Provider:     Cardiac Rehab from 06/18/2017 in [facility] Cardiac and Pulmonary Rehab  Referring Provider  Kathlyn Sacramento MD      Initial Encounter Date:    Cardiac Rehab from 06/18/2017 in [facility] Cardiac and Pulmonary Rehab  Date  06/18/17  Referring Provider  Kathlyn Sacramento MD      Visit Diagnosis: ST elevation myocardial infarction (STEMI), unspecified artery [facility])  Status post coronary artery stent placement  Patient's Home Medications on Admission:  Current Outpatient Medications:  .  amLODipine (NORVASC) 5 MG tablet, Take 5 mg by mouth 2 (two) times daily., Disp: , Rfl: 11 .  aspirin 81 MG chewable tablet, Chew 1 tablet (81 mg total) by mouth daily., Disp: 30 tablet, Rfl: 0 .  atorvastatin (LIPITOR) 80 MG tablet, Take 1 tablet (80 mg total) by mouth daily at 6 PM., Disp: 30 tablet, Rfl: 0 .  carvedilol (COREG) 3.125 MG tablet, Take 1 tablet (3.125 mg total) by mouth 2 (two) times daily with a meal., Disp: 60 tablet, Rfl: 0 .  galantamine (RAZADYNE) 8 MG tablet, Take 8 mg by mouth 2 (two) times daily., Disp: , Rfl: 11 .  pantoprazole (PROTONIX) 40 MG tablet, Take 1 tablet (40 mg total) by mouth daily., Disp: 60 tablet, Rfl: 0 .  tamsulosin (FLOMAX) 0.4 MG CAPS capsule, Take 0.4 mg by mouth daily., Disp: , Rfl:  .  ticagrelor (BRILINTA) 90 MG TABS tablet, Take 1 tablet (90 mg total) by mouth 2 (two) times daily., Disp: 60 tablet, Rfl: 11  Past Medical History: Past Medical History:  Diagnosis Date  . Alzheimer's disease   . CKD (chronic kidney disease), stage IV ([place])   . 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   Exercise Prescription Goal: Initial exercise prescription builds to 30-45 minutes a day of aerobic activity, 2-3 days per week.  Home exercise guidelines will be given to patient during program as part of exercise prescription that the participant will acknowledge.  Activity Barriers & Risk Stratification: Activity Barriers & Cardiac Risk Stratification - 06/18/17 1322      Activity Barriers & Cardiac Risk Stratification   Activity Barriers  Arthritis;Back Problems;Deconditioning;Muscular Weakness Weather changes can affect arthritis    Cardiac Risk Stratification  High       6 Minute Walk: 6 Minute Walk    Row Name 06/18/17 1425         6 Minute Walk   Phase  Initial     Distance  1590 feet     Walk Time  6 minutes     # of Rest Breaks  0     MPH  3.01     METS  3.79  RPE  11     VO2 Peak  11.8     Symptoms  No     Resting HR  61 bpm     Resting BP  134/62     Resting Oxygen Saturation   100 %     Exercise Oxygen Saturation  during 6 min walk  100 %     Max Ex. HR  100 bpm     Max Ex. BP  138/64     2 Minute Post BP  124/60        Oxygen Initial Assessment:   Oxygen Re-Evaluation:   Oxygen Discharge (Final Oxygen Re-Evaluation):   Initial Exercise Prescription: Initial Exercise Prescription - 06/18/17 1400      Date of Initial Exercise RX and Referring Provider   Date  06/18/17    Referring Provider  Kathlyn Sacramento MD      Treadmill   MPH  3    Grade  0.5    Minutes  15    METs  3.5      Recumbant Bike   Level  3    RPM  40    Watts  36    Minutes  15    METs  3.5      REL-XR   Level  2    Speed  50     Minutes  15    METs  3.5      Prescription Details   Frequency (times per week)  3    Duration  Progress to 45 minutes of aerobic exercise without signs/symptoms of physical distress      Intensity   THRR 40-80% of Max Heartrate  95-130    Ratings of Perceived Exertion  11-13    Perceived Dyspnea  0-4      Progression   Progression  Continue to progress workloads to maintain intensity without signs/symptoms of physical distress.      Resistance Training   Training Prescription  Yes    Weight  4 lbs    Reps  10-15       Perform Capillary Blood Glucose checks as needed.  Exercise Prescription Changes: Exercise Prescription Changes    Row Name 06/18/17 1300             Response to Exercise   Blood Pressure (Admit)  134/62       Blood Pressure (Exercise)  138/64       Blood Pressure (Exit)  124/60       Heart Rate (Admit)  61 bpm       Heart Rate (Exercise)  100 bpm       Heart Rate (Exit)  59 bpm       Oxygen Saturation (Admit)  100 %       Oxygen Saturation (Exercise)  100 %       Rating of Perceived Exertion (Exercise)  11       Symptoms  none       Comments  walk test results          Exercise Comments:   Exercise Goals and Review: Exercise Goals    Row Name 06/18/17 1428             Exercise Goals   Increase Physical Activity  Yes       Intervention  Provide advice, education, support and counseling about physical activity/exercise needs.;Develop an individualized exercise prescription for aerobic and resistive training based on initial evaluation findings, risk stratification,  comorbidities and participant's personal goals.       Expected Outcomes  Short Term: Attend rehab on a regular basis to increase amount of physical activity.;Long Term: Add in home exercise to make exercise part of routine and to increase amount of physical activity.;Long Term: Exercising regularly at least 3-5 days a week.       Increase Strength and Stamina  Yes        Intervention  Provide advice, education, support and counseling about physical activity/exercise needs.;Develop an individualized exercise prescription for aerobic and resistive training based on initial evaluation findings, risk stratification, comorbidities and participant's personal goals.       Expected Outcomes  Short Term: Increase workloads from initial exercise prescription for resistance, speed, and METs.;Short Term: Perform resistance training exercises routinely during rehab and add in resistance training at home;Long Term: Improve cardiorespiratory fitness, muscular endurance and strength as measured by increased METs and functional capacity (6MWT)       Able to understand and use rate of perceived exertion (RPE) scale  Yes       Intervention  Provide education and explanation on how to use RPE scale       Expected Outcomes  Short Term: Able to use RPE daily in rehab to express subjective intensity level;Long Term:  Able to use RPE to guide intensity level when exercising independently       Knowledge and understanding of Target Heart Rate Range (THRR)  Yes       Intervention  Provide education and explanation of THRR including how the numbers were predicted and where they are located for reference       Expected Outcomes  Short Term: Able to state/look up THRR;Long Term: Able to use THRR to govern intensity when exercising independently;Short Term: Able to use daily as guideline for intensity in rehab       Able to check pulse independently  Yes       Intervention  Provide education and demonstration on how to check pulse in carotid and radial arteries.;Review the importance of being able to check your own pulse for safety during independent exercise       Expected Outcomes  Short Term: Able to explain why pulse checking is important during independent exercise;Long Term: Able to check pulse independently and accurately       Understanding of Exercise Prescription  Yes       Intervention   Provide education, explanation, and written materials on patient's individual exercise prescription       Expected Outcomes  Short Term: Able to explain program exercise prescription;Long Term: Able to explain home exercise prescription to exercise independently          Exercise Goals Re-Evaluation :   Discharge Exercise Prescription (Final Exercise Prescription Changes): Exercise Prescription Changes - 06/18/17 1300      Response to Exercise   Blood Pressure (Admit)  134/62    Blood Pressure (Exercise)  138/64    Blood Pressure (Exit)  124/60    Heart Rate (Admit)  61 bpm    Heart Rate (Exercise)  100 bpm    Heart Rate (Exit)  59 bpm    Oxygen Saturation (Admit)  100 %    Oxygen Saturation (Exercise)  100 %    Rating of Perceived Exertion (Exercise)  11    Symptoms  none    Comments  walk test results       Nutrition:  Target Goals: Understanding of nutrition guidelines, daily intake of  sodium <1566m, cholesterol <2074m calories 30% from fat and 7% or less from saturated fats, daily to have 5 or more servings of fruits and vegetables.  Biometrics: Pre Biometrics - 06/18/17 1428      Pre Biometrics   Height  5' 8.9" (1.75 m)    Weight  181 lb 4.8 oz (82.2 kg)    Waist Circumference  37.5 inches    Hip Circumference  42 inches    Waist to Hip Ratio  0.89 %    BMI (Calculated)  26.85    Single Leg Stand  25.92 seconds        Nutrition Therapy Plan and Nutrition Goals: Nutrition Therapy & Goals - 06/18/17 1300      Intervention Plan   Intervention  Prescribe, educate and counsel regarding individualized specific dietary modifications aiming towards targeted core components such as weight, hypertension, lipid management, diabetes, heart failure and other comorbidities.    Expected Outcomes  Short Term Goal: Understand basic principles of dietary content, such as calories, fat, sodium, cholesterol and nutrients.;Short Term Goal: A plan has been developed with personal  nutrition goals set during dietitian appointment.;Long Term Goal: Adherence to prescribed nutrition plan.       Nutrition Assessments: Nutrition Assessments - 06/18/17 1300      MEDFICTS Scores   Pre Score  43       Nutrition Goals Re-Evaluation:   Nutrition Goals Discharge (Final Nutrition Goals Re-Evaluation):   Psychosocial: Target Goals: Acknowledge presence or absence of significant depression and/or stress, maximize coping skills, provide positive support system. Participant is able to verbalize types and ability to use techniques and skills needed for reducing stress and depression.   Initial Review & Psychosocial Screening: Initial Psych Review & Screening - 06/18/17 1301      Initial Review   Current issues with  Current Sleep Concerns Sleep concerns are chronic. Trouble getting to sleep      [place] Yes Spouse Becky       Barriers   Psychosocial barriers to participate in program  There are no identifiable barriers or psychosocial needs.;The patient should benefit from training in stress management and relaxation.      Screening Interventions   Interventions  Encouraged to exercise;Provide feedback about the scores to participant    Expected Outcomes  Short Term goal: Utilizing psychosocial counselor, staff and physician to assist with identification of specific Stressors or current issues interfering with healing process. Setting desired goal for each stressor or current issue identified.;Long Term Goal: Stressors or current issues are controlled or eliminated.;Short Term goal: Identification and review with participant of any Quality of Life or Depression concerns found by scoring the questionnaire.;Long Term goal: The participant improves quality of Life and PHQ9 Scores as seen by post scores and/or verbalization of changes       Quality of Life Scores:  Quality of Life - 06/18/17 1303      Quality of Life Scores    Health/Function Pre  23.46 %    Socioeconomic Pre  20.93 %    Psych/Spiritual Pre  24.21 %    Family Pre  19.8 %    GLOBAL Pre  22.53 %      Scores of 19 and below usually indicate a poorer quality of life in these areas.  A difference of  2-3 points is a clinically meaningful difference.  A difference of 2-3 points in the total score of the Quality of Life Index  has been associated with significant improvement in overall quality of life, self-image, physical symptoms, and general health in studies assessing change in quality of life.  PHQ-9: Recent Review Flowsheet Data    Depression screen [facility] 2/9 06/18/2017   Decreased Interest 0   Down, Depressed, Hopeless 0   PHQ - 2 Score 0   Altered sleeping 2   Tired, decreased energy 0   Change in appetite 0   Feeling bad or failure about yourself  0   Trouble concentrating 0   Moving slowly or fidgety/restless 0   Suicidal thoughts 0   PHQ-9 Score 2   Difficult doing work/chores Not difficult at all     Interpretation of Total Score  Total Score Depression Severity:  1-4 = Minimal depression, 5-9 = Mild depression, 10-14 = Moderate depression, 15-19 = Moderately severe depression, 20-27 = Severe depression   Psychosocial Evaluation and Intervention:   Psychosocial Re-Evaluation:   Psychosocial Discharge (Final Psychosocial Re-Evaluation):   Vocational Rehabilitation: Provide vocational rehab assistance to qualifying candidates.   Vocational Rehab Evaluation & Intervention: Vocational Rehab - 06/18/17 1311      Initial Vocational Rehab Evaluation & Intervention   Assessment shows need for Vocational Rehabilitation  No       Education: Education Goals: Education classes will be provided on a variety of topics geared toward better understanding of heart health and risk factor modification. Participant will state understanding/return demonstration of topics presented as noted by education test scores.  Learning  Barriers/Preferences: Learning Barriers/Preferences - 06/18/17 1310      Learning Barriers/Preferences   Learning Barriers  Hearing Has tinnitis    Learning Preferences  Skilled Demonstration;Verbal Instruction;Individual Instruction       Education Topics:  AED/CPR: - Group verbal and written instruction with the use of models to demonstrate the basic use of the AED with the basic ABC's of resuscitation.   General Nutrition Guidelines/Fats and Fiber: -Group instruction provided by verbal, written material, models and posters to present the general guidelines for heart healthy nutrition. Gives an explanation and review of dietary fats and fiber.   Controlling Sodium/Reading Food Labels: -Group verbal and written material supporting the discussion of sodium use in heart healthy nutrition. Review and explanation with models, verbal and written materials for utilization of the food label.   Exercise Physiology & General Exercise Guidelines: - Group verbal and written instruction with models to review the exercise physiology of the cardiovascular system and associated critical values. Provides general exercise guidelines with specific guidelines to those with heart or lung disease.    Aerobic Exercise & Resistance Training: - Gives group verbal and written instruction on the various components of exercise. Focuses on aerobic and resistive training programs and the benefits of this training and how to safely progress through these programs..   Flexibility, Balance, Mind/Body Relaxation: Provides group verbal/written instruction on the benefits of flexibility and balance training, including mind/body exercise modes such as yoga, pilates and tai chi.  Demonstration and skill practice provided.   Stress and Anxiety: - Provides group verbal and written instruction about the health risks of elevated stress and causes of high stress.  Discuss the correlation between heart/lung disease and  anxiety and treatment options. Review healthy ways to manage with stress and anxiety.   Depression: - Provides group verbal and written instruction on the correlation between heart/lung disease and depressed mood, treatment options, and the stigmas associated with seeking treatment.   Anatomy & Physiology of the Heart: - Group  verbal and written instruction and models provide basic cardiac anatomy and physiology, with the coronary electrical and arterial systems. Review of Valvular disease and Heart Failure   Cardiac Procedures: - Group verbal and written instruction to review commonly prescribed medications for heart disease. Reviews the medication, class of the drug, and side effects. Includes the steps to properly store meds and maintain the prescription regimen. (beta blockers and nitrates)   Cardiac Medications I: - Group verbal and written instruction to review commonly prescribed medications for heart disease. Reviews the medication, class of the drug, and side effects. Includes the steps to properly store meds and maintain the prescription regimen.   Cardiac Medications II: -Group verbal and written instruction to review commonly prescribed medications for heart disease. Reviews the medication, class of the drug, and side effects. (all other drug classes)    Go Sex-Intimacy & Heart Disease, Get SMART - Goal Setting: - Group verbal and written instruction through game format to discuss heart disease and the return to sexual intimacy. Provides group verbal and written material to discuss and apply goal setting through the application of the S.M.A.R.T. Method.   Other Matters of the Heart: - Provides group verbal, written materials and models to describe Stable Angina and Peripheral Artery. Includes description of the disease process and treatment options available to the cardiac patient.   Exercise & Equipment Safety: - Individual verbal instruction and demonstration of  equipment use and safety with use of the equipment.   Cardiac Rehab from 06/18/2017 in [facility] Cardiac and Pulmonary Rehab  Date  06/18/17  Educator  [facility]  Instruction Review Code  1- Verbalizes Understanding      Infection Prevention: - Provides verbal and written material to individual with discussion of infection control including proper hand washing and proper equipment cleaning during exercise session.   Cardiac Rehab from 06/18/2017 in [facility] Cardiac and Pulmonary Rehab  Date  06/18/17  Educator  [facility]  Instruction Review Code  1- Verbalizes Understanding      Falls Prevention: - Provides verbal and written material to individual with discussion of falls prevention and safety.   Cardiac Rehab from 06/18/2017 in [facility] Cardiac and Pulmonary Rehab  Date  06/18/17  Educator  [facility]  Instruction Review Code  1- Verbalizes Understanding      Diabetes: - Individual verbal and written instruction to review signs/symptoms of diabetes, desired ranges of glucose level fasting, after meals and with exercise. Acknowledge that pre and post exercise glucose checks will be done for 3 sessions at entry of program.   Know Your Numbers and Risk Factors: -Group verbal and written instruction about important numbers in your health.  Discussion of what are risk factors and how they play a role in the disease process.  Review of Cholesterol, Blood Pressure, Diabetes, and BMI and the role they play in your overall health.   Sleep Hygiene: -Provides group verbal and written instruction about how sleep can affect your health.  Define sleep hygiene, discuss sleep cycles and impact of sleep habits. Review good sleep hygiene tips.    Other: -Provides group and verbal instruction on various topics (see comments)   Knowledge Questionnaire Score: Knowledge Questionnaire Score - 06/18/17 1311      Knowledge Questionnaire Score   Pre Score  19/28 reviewed correct responses with Luciana Axe today. He verbalized  understanding and had no further questions       Core Components/Risk Factors/Patient Goals at Admission: Personal Goals and Risk Factors at Admission - 06/18/17 1301  Core Components/Risk Factors/Patient Goals on Admission    Weight Management  Yes;Weight Loss    Intervention  Weight Management: Develop a combined nutrition and exercise program designed to reach desired caloric intake, while maintaining appropriate intake of nutrient and fiber, sodium and fats, and appropriate energy expenditure required for the weight goal.;Weight Management: Provide education and appropriate resources to help participant work on and attain dietary goals.    Admit Weight  181 lb 4.8 oz (82.2 kg)    Goal Weight: Short Term  176 lb (79.8 kg)    Goal Weight: Long Term  171 lb (77.6 kg)    Expected Outcomes  Short Term: Continue to assess and modify interventions until short term weight is achieved;Long Term: Adherence to nutrition and physical activity/exercise program aimed toward attainment of established weight goal;Weight Loss: Understanding of general recommendations for a balanced deficit meal plan, which promotes 1-2 lb weight loss per week and includes a negative energy balance of [phone] kcal/d;Understanding recommendations for meals to include 15-35% energy as protein, 25-35% energy from fat, 35-60% energy from carbohydrates, less than 237m of dietary cholesterol, 20-35 gm of total fiber daily;Understanding of distribution of calorie intake throughout the day with the consumption of 4-5 meals/snacks    Hypertension  Yes    Intervention  Provide education on lifestyle modifcations including regular physical activity/exercise, weight management, moderate sodium restriction and increased consumption of fresh fruit, vegetables, and low fat dairy, alcohol moderation, and smoking cessation.;Monitor prescription use compliance.    Expected Outcomes  Short Term: Continued assessment and intervention until BP  is < 140/953mHG in hypertensive participants. < 130/8067mG in hypertensive participants with diabetes, heart failure or chronic kidney disease.;Long Term: Maintenance of blood pressure at goal levels.    Lipids  Yes    Intervention  Provide education and support for participant on nutrition & aerobic/resistive exercise along with prescribed medications to achieve LDL <67m83mDL >40mg18m Expected Outcomes  Short Term: Participant states understanding of desired cholesterol values and is compliant with medications prescribed. Participant is following exercise prescription and nutrition guidelines.;Long Term: Cholesterol controlled with medications as prescribed, with individualized exercise RX and with personalized nutrition plan. Value goals: LDL < 67mg,19m > 40 mg.       Core Components/Risk Factors/Patient Goals Review:    Core Components/Risk Factors/Patient Goals at Discharge (Final Review):    ITP Comments: ITP Comments    Row Name 06/18/17 1257           ITP Comments  Medical review completed. ITP sent to Dr M MailFrederik Peariew, make changes as needed and sign.   Documentation of diagnosis can be found [facility] 05/28/2017 encounter          Comments: Initial ITP

## 2017-06-27 ENCOUNTER — Encounter: Payer: PPO | Admitting: *Deleted

## 2017-06-27 DIAGNOSIS — I213 ST elevation (STEMI) myocardial infarction of unspecified site: Secondary | ICD-10-CM | POA: Diagnosis not present

## 2017-06-27 DIAGNOSIS — Z955 Presence of coronary angioplasty implant and graft: Secondary | ICD-10-CM

## 2017-06-27 NOTE — Progress Notes (Signed)
Daily Session Note  Patient Details  Name: Troy Chavez MRN: [id number] Date of Birth: March 08, 1946 Referring Provider:     Cardiac Rehab from 06/18/2017 in [facility] Card[facility]  Referring Provider  Kathlyn Sacramento MD      Encounter Date: 06/27/2017  Check In: Session Check In - 06/27/17 1628      Check-In   Location  [facility]-Cardiac & Pulmonary Rehab    Staff Present  Renita Papa, RN Vickki Hearing, BA, ACSM CEP, Exercise Physiologist;Carroll Enterkin, RN, BSN    Supervising physician immediately available to respond to emergencies  See telemetry face sheet for immediately available ER MD    Medication changes reported      No    Fall or balance concerns reported     No    Warm-up and Cool-down  Performed on first and last piece of equipment    Resistance Training Performed  Yes    VAD Patient?  No      Pain Assessment   Currently in Pain?  No/denies          Social History   Tobacco Use  Smoking Status Former Smoker  Smokeless Tobacco Never Used  Tobacco Comment   Quit 35 yeaers ago    Goals Met:  Independence with exercise equipment Exercise tolerated well No report of cardiac concerns or symptoms Strength training completed today  Goals Unmet:  Not Applicable  Comments: First full day of exercise!  Patient was oriented to gym and equipment including functions, settings, policies, and procedures.  Patient's individual exercise prescription and treatment plan were reviewed.  All starting workloads were established based on the results of the 6 minute walk test done at initial orientation visit.  The plan for exercise progression was also introduced and progression will be customized based on patient's performance and goals.    Dr. Emily Filbert is Medical Director for [facility].

## 2017-07-02 ENCOUNTER — Encounter: Payer: PPO | Admitting: *Deleted

## 2017-07-02 DIAGNOSIS — Z955 Presence of coronary angioplasty implant and graft: Secondary | ICD-10-CM

## 2017-07-02 DIAGNOSIS — I213 ST elevation (STEMI) myocardial infarction of unspecified site: Secondary | ICD-10-CM | POA: Diagnosis not present

## 2017-07-02 NOTE — Progress Notes (Signed)
Daily Session Note  Patient Details  Name: Troy Chavez MRN: [id number] Date of Birth: 18-Jun-1945 Referring Provider:     Cardiac Rehab from 06/18/2017 in [facility] Cardiac [facility] Rehab  Referring Provider  Kathlyn Sacramento MD      Encounter Date: 07/02/2017  Check In: Session Check In - 07/02/17 1716      Check-In   Location  [facility]    Staff Present  Nada Maclachlan, BA, ACSM CEP, Exercise Physiologist;Suzie Vandam Amedeo Plenty, BS, ACSM CEP, Exercise Physiologist;Meredith Sherryll Burger, RN BSN;Susanne Bice, RN, BSN, Florestine Avers, RN BSN    Supervising physician immediately available to respond to emergencies  See telemetry face sheet for immediately available ER MD    Medication changes reported      No    Fall or balance concerns reported     No    Warm-up and Cool-down  Performed on first and last piece of equipment    Resistance Training Performed  Yes    VAD Patient?  No      Pain Assessment   Currently in Pain?  No/denies    Multiple Pain Sites  No          Social History   Tobacco Use  Smoking Status Former Smoker  Smokeless Tobacco Never Used  Tobacco Comment   Quit 35 yeaers ago    Goals Met:  Independence with exercise equipment Exercise tolerated well No report of cardiac concerns or symptoms Strength training completed today  Goals Unmet:  Not Applicable  Comments: Pt able to follow exercise prescription today without complaint.  Will continue to monitor for progression.    Dr. Emily Filbert is Medical Director for [facility].

## 2017-07-04 ENCOUNTER — Encounter: Payer: Self-pay | Admitting: *Deleted

## 2017-07-04 DIAGNOSIS — I213 ST elevation (STEMI) myocardial infarction of unspecified site: Secondary | ICD-10-CM

## 2017-07-04 DIAGNOSIS — Z955 Presence of coronary angioplasty implant and graft: Secondary | ICD-10-CM

## 2017-07-04 NOTE — Progress Notes (Signed)
Cardiac Individual Treatment Plan  Patient Details  Name: Troy Chavez MRN: [id number] Date of Birth: 08-15-1945 Referring Provider:     Cardiac Rehab from 06/18/2017 in [facility] Cardiac and Pulmonary Rehab  Referring Provider  Kathlyn Sacramento MD      Initial Encounter Date:    Cardiac Rehab from 06/18/2017 in [facility] Cardiac and Pulmonary Rehab  Date  06/18/17  Referring Provider  Kathlyn Sacramento MD      Visit Diagnosis: ST elevation myocardial infarction (STEMI), unspecified artery [facility])  Status post coronary artery stent placement  Patient's Home Medications on Admission:  Current Outpatient Medications:  .  amLODipine (NORVASC) 5 MG tablet, Take 5 mg by mouth 2 (two) times daily., Disp: , Rfl: 11 .  aspirin 81 MG chewable tablet, Chew 1 tablet (81 mg total) by mouth daily., Disp: 30 tablet, Rfl: 0 .  atorvastatin (LIPITOR) 80 MG tablet, Take 1 tablet (80 mg total) by mouth daily at 6 PM., Disp: 30 tablet, Rfl: 0 .  carvedilol (COREG) 3.125 MG tablet, Take 1 tablet (3.125 mg total) by mouth 2 (two) times daily with a meal., Disp: 60 tablet, Rfl: 0 .  galantamine (RAZADYNE) 8 MG tablet, Take 8 mg by mouth 2 (two) times daily., Disp: , Rfl: 11 .  pantoprazole (PROTONIX) 40 MG tablet, Take 1 tablet (40 mg total) by mouth daily., Disp: 60 tablet, Rfl: 0 .  tamsulosin (FLOMAX) 0.4 MG CAPS capsule, Take 0.4 mg by mouth daily., Disp: , Rfl:  .  ticagrelor (BRILINTA) 90 MG TABS tablet, Take 1 tablet (90 mg total) by mouth 2 (two) times daily., Disp: 60 tablet, Rfl: 11  Past Medical History: Past Medical History:  Diagnosis Date  . Alzheimer's disease   . CKD (chronic kidney disease), stage IV ([place])   . Hypertension     Tobacco Use: Social History   Tobacco Use  Smoking Status Former Smoker  Smokeless Tobacco Never Used  Tobacco Comment   Quit 35 yeaers ago    Labs: Recent Merchant [employer] for [employer] Cardiac and Pulmonary Rehab Latest Ref Rng & Units 05/29/2017    Cholestrol 0 - 200 mg/dL 214(H)   LDLCALC 0 - 99 mg/dL 146(H)   HDL >40 mg/dL 52   Trlycerides <150 mg/dL 82       Exercise Target Goals:    Exercise Program Goal: Individual exercise prescription set using results from initial 6 min walk test and THRR while considering  patient's activity barriers and safety.   Exercise Prescription Goal: Initial exercise prescription builds to 30-45 minutes a day of aerobic activity, 2-3 days per week.  Home exercise guidelines will be given to patient during program as part of exercise prescription that the participant will acknowledge.  Activity Barriers & Risk Stratification: Activity Barriers & Cardiac Risk Stratification - 06/18/17 1322      Activity Barriers & Cardiac Risk Stratification   Activity Barriers  Arthritis;Back Problems;Deconditioning;Muscular Weakness Weather changes can affect arthritis    Cardiac Risk Stratification  High       6 Minute Walk: 6 Minute Walk    Row Name 06/18/17 1425         6 Minute Walk   Phase  Initial     Distance  1590 feet     Walk Time  6 minutes     # of Rest Breaks  0     MPH  3.01     METS  3.79  RPE  11     VO2 Peak  11.8     Symptoms  No     Resting HR  61 bpm     Resting BP  134/62     Resting Oxygen Saturation   100 %     Exercise Oxygen Saturation  during 6 min walk  100 %     Max Ex. HR  100 bpm     Max Ex. BP  138/64     2 Minute Post BP  124/60        Oxygen Initial Assessment:   Oxygen Re-Evaluation:   Oxygen Discharge (Final Oxygen Re-Evaluation):   Initial Exercise Prescription: Initial Exercise Prescription - 06/18/17 1400      Date of Initial Exercise RX and Referring Provider   Date  06/18/17    Referring Provider  Kathlyn Sacramento MD      Treadmill   MPH  3    Grade  0.5    Minutes  15    METs  3.5      Recumbant Bike   Level  3    RPM  40    Watts  36    Minutes  15    METs  3.5      REL-XR   Level  2    Speed  50    Minutes  15     METs  3.5      Prescription Details   Frequency (times per week)  3    Duration  Progress to 45 minutes of aerobic exercise without signs/symptoms of physical distress      Intensity   THRR 40-80% of Max Heartrate  95-130    Ratings of Perceived Exertion  11-13    Perceived Dyspnea  0-4      Progression   Progression  Continue to progress workloads to maintain intensity without signs/symptoms of physical distress.      Resistance Training   Training Prescription  Yes    Weight  4 lbs    Reps  10-15       Perform Capillary Blood Glucose checks as needed.  Exercise Prescription Changes: Exercise Prescription Changes    Row Name 06/18/17 1300             Response to Exercise   Blood Pressure (Admit)  134/62       Blood Pressure (Exercise)  138/64       Blood Pressure (Exit)  124/60       Heart Rate (Admit)  61 bpm       Heart Rate (Exercise)  100 bpm       Heart Rate (Exit)  59 bpm       Oxygen Saturation (Admit)  100 %       Oxygen Saturation (Exercise)  100 %       Rating of Perceived Exertion (Exercise)  11       Symptoms  none       Comments  walk test results          Exercise Comments: Exercise Comments    Row Name 06/27/17 1629           Exercise Comments  First full day of exercise!  Patient was oriented to gym and equipment including functions, settings, policies, and procedures.  Patient's individual exercise prescription and treatment plan were reviewed.  All starting workloads were established based on the results of the 6 minute walk test done at initial  orientation visit.  The plan for exercise progression was also introduced and progression will be customized based on patient's performance and goals          Exercise Goals and Review: Exercise Goals    Row Name 06/18/17 1428             Exercise Goals   Increase Physical Activity  Yes       Intervention  Provide advice, education, support and counseling about physical activity/exercise  needs.;Develop an individualized exercise prescription for aerobic and resistive training based on initial evaluation findings, risk stratification, comorbidities and participant's personal goals.       Expected Outcomes  Short Term: Attend rehab on a regular basis to increase amount of physical activity.;Long Term: Add in home exercise to make exercise part of routine and to increase amount of physical activity.;Long Term: Exercising regularly at least 3-5 days a week.       Increase Strength and Stamina  Yes       Intervention  Provide advice, education, support and counseling about physical activity/exercise needs.;Develop an individualized exercise prescription for aerobic and resistive training based on initial evaluation findings, risk stratification, comorbidities and participant's personal goals.       Expected Outcomes  Short Term: Increase workloads from initial exercise prescription for resistance, speed, and METs.;Short Term: Perform resistance training exercises routinely during rehab and add in resistance training at home;Long Term: Improve cardiorespiratory fitness, muscular endurance and strength as measured by increased METs and functional capacity (6MWT)       Able to understand and use rate of perceived exertion (RPE) scale  Yes       Intervention  Provide education and explanation on how to use RPE scale       Expected Outcomes  Short Term: Able to use RPE daily in rehab to express subjective intensity level;Long Term:  Able to use RPE to guide intensity level when exercising independently       Knowledge and understanding of Target Heart Rate Range (THRR)  Yes       Intervention  Provide education and explanation of THRR including how the numbers were predicted and where they are located for reference       Expected Outcomes  Short Term: Able to state/look up THRR;Long Term: Able to use THRR to govern intensity when exercising independently;Short Term: Able to use daily as guideline  for intensity in rehab       Able to check pulse independently  Yes       Intervention  Provide education and demonstration on how to check pulse in carotid and radial arteries.;Review the importance of being able to check your own pulse for safety during independent exercise       Expected Outcomes  Short Term: Able to explain why pulse checking is important during independent exercise;Long Term: Able to check pulse independently and accurately       Understanding of Exercise Prescription  Yes       Intervention  Provide education, explanation, and written materials on patient's individual exercise prescription       Expected Outcomes  Short Term: Able to explain program exercise prescription;Long Term: Able to explain home exercise prescription to exercise independently          Exercise Goals Re-Evaluation : Exercise Goals Re-Evaluation    Row Name 06/27/17 1629             Exercise Goal Re-Evaluation   Exercise Goals Review  Able to  understand and use rate of perceived exertion (RPE) scale;Knowledge and understanding of Target Heart Rate Range (THRR);Understanding of Exercise Prescription       Comments  Reviewed RPE scale, THR and program prescription with pt today.  Pt voiced understanding and was given a copy of goals to take home.        Expected Outcomes  Short: Use RPE daily to regulate intensity.  Long: Follow program prescription in THR.          Discharge Exercise Prescription (Final Exercise Prescription Changes): Exercise Prescription Changes - 06/18/17 1300      Response to Exercise   Blood Pressure (Admit)  134/62    Blood Pressure (Exercise)  138/64    Blood Pressure (Exit)  124/60    Heart Rate (Admit)  61 bpm    Heart Rate (Exercise)  100 bpm    Heart Rate (Exit)  59 bpm    Oxygen Saturation (Admit)  100 %    Oxygen Saturation (Exercise)  100 %    Rating of Perceived Exertion (Exercise)  11    Symptoms  none    Comments  walk test results        Nutrition:  Target Goals: Understanding of nutrition guidelines, daily intake of sodium <1533m, cholesterol <2070m calories 30% from fat and 7% or less from saturated fats, daily to have 5 or more servings of fruits and vegetables.  Biometrics: Pre Biometrics - 06/18/17 1428      Pre Biometrics   Height  5' 8.9" (1.75 m)    Weight  181 lb 4.8 oz (82.2 kg)    Waist Circumference  37.5 inches    Hip Circumference  42 inches    Waist to Hip Ratio  0.89 %    BMI (Calculated)  26.85    Single Leg Stand  25.92 seconds        Nutrition Therapy Plan and Nutrition Goals: Nutrition Therapy & Goals - 06/18/17 1300      Intervention Plan   Intervention  Prescribe, educate and counsel regarding individualized specific dietary modifications aiming towards targeted core components such as weight, hypertension, lipid management, diabetes, heart failure and other comorbidities.    Expected Outcomes  Short Term Goal: Understand basic principles of dietary content, such as calories, fat, sodium, cholesterol and nutrients.;Short Term Goal: A plan has been developed with personal nutrition goals set during dietitian appointment.;Long Term Goal: Adherence to prescribed nutrition plan.       Nutrition Assessments: Nutrition Assessments - 06/18/17 1300      MEDFICTS Scores   Pre Score  43       Nutrition Goals Re-Evaluation:   Nutrition Goals Discharge (Final Nutrition Goals Re-Evaluation):   Psychosocial: Target Goals: Acknowledge presence or absence of significant depression and/or stress, maximize coping skills, provide positive support system. Participant is able to verbalize types and ability to use techniques and skills needed for reducing stress and depression.   Initial Review & Psychosocial Screening: Initial Psych Review & Screening - 06/18/17 1301      Initial Review   Current issues with  Current Sleep Concerns Sleep concerns are chronic. Trouble getting to sleep       [place]       Barriers   Psychosocial barriers to participate in program  There are no identifiable barriers or psychosocial needs.;The patient should benefit from training in stress management and relaxation.      Screening Interventions  Interventions  Encouraged to exercise;Provide feedback about the scores to participant    Expected Outcomes  Short Term goal: Utilizing psychosocial counselor, staff and physician to assist with identification of specific Stressors or current issues interfering with healing process. Setting desired goal for each stressor or current issue identified.;Long Term Goal: Stressors or current issues are controlled or eliminated.;Short Term goal: Identification and review with participant of any Quality of Life or Depression concerns found by scoring the questionnaire.;Long Term goal: The participant improves quality of Life and PHQ9 Scores as seen by post scores and/or verbalization of changes       Quality of Life Scores:  Quality of Life - 06/18/17 1303      Quality of Life Scores   Health/Function Pre  23.46 %    Socioeconomic Pre  20.93 %    Psych/Spiritual Pre  24.21 %    Family Pre  19.8 %    GLOBAL Pre  22.53 %      Scores of 19 and below usually indicate a poorer quality of life in these areas.  A difference of  2-3 points is a clinically meaningful difference.  A difference of 2-3 points in the total score of the Quality of Life Index has been associated with significant improvement in overall quality of life, self-image, physical symptoms, and general health in studies assessing change in quality of life.  PHQ-9: Recent Review Flowsheet Data    Depression screen [facility] 2/9 06/18/2017   Decreased Interest 0   Down, Depressed, Hopeless 0   PHQ - 2 Score 0   Altered sleeping 2   Tired, decreased energy 0   Change in appetite 0   Feeling bad or failure about yourself  0   Trouble concentrating 0    Moving slowly or fidgety/restless 0   Suicidal thoughts 0   PHQ-9 Score 2   Difficult doing work/chores Not difficult at all     Interpretation of Total Score  Total Score Depression Severity:  1-4 = Minimal depression, 5-9 = Mild depression, 10-14 = Moderate depression, 15-19 = Moderately severe depression, 20-27 = Severe depression   Psychosocial Evaluation and Intervention:   Psychosocial Re-Evaluation:   Psychosocial Discharge (Final Psychosocial Re-Evaluation):   Vocational Rehabilitation: Provide vocational rehab assistance to qualifying candidates.   Vocational Rehab Evaluation & Intervention: Vocational Rehab - 06/18/17 1311      Initial Vocational Rehab Evaluation & Intervention   Assessment shows need for Vocational Rehabilitation  No       Education: Education Goals: Education classes will be provided on a variety of topics geared toward better understanding of heart health and risk factor modification. Participant will state understanding/return demonstration of topics presented as noted by education test scores.  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   General Nutrition Guidelines/Fats and Fiber: -Group instruction provided by verbal, written material, models and posters to present the general guidelines for heart healthy nutrition. Gives an explanation and review of dietary fats and fiber.   Controlling Sodium/Reading Food Labels: -Group verbal and written material supporting the discussion of sodium use in heart healthy nutrition. Review and explanation with models, verbal  and  written materials for utilization of the food label.   Exercise Physiology & General Exercise Guidelines: - Group verbal and written instruction with models to review the exercise physiology of the cardiovascular system and associated critical values. Provides general exercise guidelines with specific guidelines to those with heart or lung disease.    Aerobic Exercise & Resistance Training: - Gives group verbal and written instruction on the various components of exercise. Focuses on aerobic and resistive training programs and the benefits of this training and how to safely progress through these programs..   Cardiac Rehab from 07/02/2017 in [facility] Cardiac and Pulmonary Rehab  Date  07/02/17  Educator  AS  Instruction Review Code  1- Verbalizes Understanding      Flexibility, Balance, Mind/Body Relaxation: Provides group verbal/written instruction on the benefits of flexibility and balance training, including mind/body exercise modes such as yoga, pilates and tai chi.  Demonstration and skill practice provided.   Stress and Anxiety: - Provides group verbal and written instruction about the health risks of elevated stress and causes of high stress.  Discuss the correlation between heart/lung disease and anxiety and treatment options. Review healthy ways to manage with stress and anxiety.   Depression: - Provides group verbal and written instruction on the correlation between heart/lung disease and depressed mood, treatment options, and the stigmas associated with seeking treatment.   Cardiac Rehab from 07/02/2017 in [facility] Cardiac and Pulmonary Rehab  Date  06/27/17  Educator  [facility]  Instruction Review Code  1- Verbalizes Understanding      Anatomy & Physiology of the Heart: - Group verbal and written instruction and models provide basic cardiac anatomy and physiology, with the coronary electrical and arterial systems. Review of Valvular disease and Heart Failure   Cardiac Procedures: -  Group verbal and written instruction to review commonly prescribed medications for heart disease. Reviews the medication, class of the drug, and side effects. Includes the steps to properly store meds and maintain the prescription regimen. (beta blockers and nitrates)   Cardiac Medications I: - Group verbal and written instruction to review commonly prescribed medications for heart disease. Reviews the medication, class of the drug, and side effects. Includes the steps to properly store meds and maintain the prescription regimen.   Cardiac Medications II: -Group verbal and written instruction to review commonly prescribed medications for heart disease. Reviews the medication, class of the drug, and side effects. (all other drug classes)    Go Sex-Intimacy & Heart Disease, Get SMART - Goal Setting: - Group verbal and written instruction through game format to discuss heart disease and the return to sexual intimacy. Provides group verbal and written material to discuss and apply goal setting through the application of the S.M.A.R.T. Method.   Other Matters of the Heart: - Provides group verbal, written materials and models to describe Stable Angina and Peripheral Artery. Includes description of the disease process and treatment options available to the cardiac patient.   Exercise & Equipment Safety: - Individual verbal instruction and demonstration of equipment use and safety with use of the equipment.   Cardiac Rehab from 07/02/2017 in [facility] Cardiac and Pulmonary Rehab  Date  06/18/17  Educator  [facility]  Instruction Review Code  1- Verbalizes Understanding      Infection Prevention: - Provides verbal and written material to individual with discussion of infection control including proper hand washing and proper equipment cleaning during exercise session.   Cardiac Rehab from 07/02/2017 in [facility] Cardiac and Pulmonary Rehab  Date  06/18/17  Educator  [place]  Instruction Review Code  1- Verbalizes  Understanding      Falls Prevention: - Provides verbal and written material to individual with discussion of falls prevention and safety.   Cardiac Rehab from 07/02/2017 in [facility] Cardiac and Pulmonary Rehab  Date  06/18/17  Educator  [facility]  Instruction Review Code  1- Verbalizes Understanding      Diabetes: - Individual verbal and written instruction to review signs/symptoms of diabetes, desired ranges of glucose level fasting, after meals and with exercise. Acknowledge that pre and post exercise glucose checks will be done for 3 sessions at entry of program.   Know Your Numbers and Risk Factors: -Group verbal and written instruction about important numbers in your health.  Discussion of what are risk factors and how they play a role in the disease process.  Review of Cholesterol, Blood Pressure, Diabetes, and BMI and the role they play in your overall health.   Sleep Hygiene: -Provides group verbal and written instruction about how sleep can affect your health.  Define sleep hygiene, discuss sleep cycles and impact of sleep habits. Review good sleep hygiene tips.    Other: -Provides group and verbal instruction on various topics (see comments)   Knowledge Questionnaire Score: Knowledge Questionnaire Score - 06/18/17 1311      Knowledge Questionnaire Score   Pre Score  19/28 reviewed correct responses with Luciana Axe today. He verbalized understanding and had no further questions       Core Components/Risk Factors/Patient Goals at Admission: Personal Goals and Risk Factors at Admission - 06/18/17 1301      Core Components/Risk Factors/Patient Goals on Admission    Weight Management  Yes;Weight Loss    Intervention  Weight Management: Develop a combined nutrition and exercise program designed to reach desired caloric intake, while maintaining appropriate intake of nutrient and fiber, sodium and fats, and appropriate energy expenditure required for the weight goal.;Weight Management:  Provide education and appropriate resources to help participant work on and attain dietary goals.    Admit Weight  181 lb 4.8 oz (82.2 kg)    Goal Weight: Short Term  176 lb (79.8 kg)    Goal Weight: Long Term  171 lb (77.6 kg)    Expected Outcomes  Short Term: Continue to assess and modify interventions until short term weight is achieved;Long Term: Adherence to nutrition and physical activity/exercise program aimed toward attainment of established weight goal;Weight Loss: Understanding of general recommendations for a balanced deficit meal plan, which promotes 1-2 lb weight loss per week and includes a negative energy balance of [phone] kcal/d;Understanding recommendations for meals to include 15-35% energy as protein, 25-35% energy from fat, 35-60% energy from carbohydrates, less than 212m of dietary cholesterol, 20-35 gm of total fiber daily;Understanding of distribution of calorie intake throughout the day with the consumption of 4-5 meals/snacks    Hypertension  Yes    Intervention  Provide education on lifestyle modifcations including regular physical activity/exercise, weight management, moderate sodium restriction and increased consumption of fresh fruit, vegetables, and low fat dairy, alcohol moderation, and smoking cessation.;Monitor prescription use compliance.    Expected Outcomes  Short Term: Continued assessment and intervention until BP is < 140/967mHG in hypertensive participants. < 130/8068mG in hypertensive participants with diabetes, heart failure or chronic kidney disease.;Long Term: Maintenance of blood pressure at goal levels.    Lipids  Yes    Intervention  Provide education and support for participant on nutrition & aerobic/resistive  exercise along with prescribed medications to achieve LDL <70m, HDL >458m    Expected Outcomes  Short Term: Participant states understanding of desired cholesterol values and is compliant with medications prescribed. Participant is following  exercise prescription and nutrition guidelines.;Long Term: Cholesterol controlled with medications as prescribed, with individualized exercise RX and with personalized nutrition plan. Value goals: LDL < 7034mHDL > 40 mg.       Core Components/Risk Factors/Patient Goals Review:    Core Components/Risk Factors/Patient Goals at Discharge (Final Review):    ITP Comments: ITP Comments    Row Name 06/18/17 1257 07/04/17 0601         ITP Comments  Medical review completed. ITP sent to Dr M MFrederik Pearreview, make changes as needed and sign.   Documentation of diagnosis can be found [facility] 05/28/2017 encounter  30 day review. Continue with ITP unless directed changes per Medical Director review.  New to program  2 visits          Comments:

## 2017-07-05 DIAGNOSIS — I213 ST elevation (STEMI) myocardial infarction of unspecified site: Secondary | ICD-10-CM

## 2017-07-05 NOTE — Progress Notes (Signed)
Daily Session Note  Patient Details  Name: Troy Chavez MRN: [id number] Date of Birth: [birth year] Referring Provider:     Cardiac Rehab from 06/18/2017 in [facility] Cardiac and Pulmonary Rehab  Referring Provider  Kathlyn Sacramento MD      Encounter Date: 07/05/2017  Check In: Session Check In - 07/05/17 1705      Check-In   Location  [facility]-Cardiac & Pulmonary Rehab    Staff Present  Earlean Shawl, BS, ACSM CEP, Exercise Physiologist;Meredith Sherryll Burger, RN BSN;Shewanda Sharpe Flavia Shipper    Supervising physician immediately available to respond to emergencies  See telemetry face sheet for immediately available ER MD    Medication changes reported      No    Fall or balance concerns reported     No    Warm-up and Cool-down  Performed on first and last piece of equipment    Resistance Training Performed  Yes    VAD Patient?  No      Pain Assessment   Currently in Pain?  No/denies          Social History   Tobacco Use  Smoking Status Former Smoker  Smokeless Tobacco Never Used  Tobacco Comment   Quit 35 yeaers ago    Goals Met:  Independence with exercise equipment Exercise tolerated well No report of cardiac concerns or symptoms Strength training completed today  Goals Unmet:  Not Applicable  Comments: Pt able to follow exercise prescription today without complaint.  Will continue to monitor for progression.   Dr. Emily Filbert is Medical Director for [place] and [facility].

## 2017-07-09 ENCOUNTER — Encounter: Payer: PPO | Attending: Cardiovascular Disease

## 2017-07-09 DIAGNOSIS — Z955 Presence of coronary angioplasty implant and graft: Secondary | ICD-10-CM | POA: Diagnosis not present

## 2017-07-09 DIAGNOSIS — I213 ST elevation (STEMI) myocardial infarction of unspecified site: Secondary | ICD-10-CM

## 2017-07-09 NOTE — Progress Notes (Signed)
Daily Session Note  Patient Details  Name: Troy Chavez MRN: [id number] Date of Birth: 07-07-1945 Referring Provider:     Cardiac Rehab from 06/18/2017 in [facility] Cardiac and Pulmonary Rehab  Referring Provider  Kathlyn Sacramento MD      Encounter Date: 07/09/2017  Check In: Session Check In - 07/09/17 1723      Check-In   Location  [facility]-Cardiac & Pulmonary Rehab    Staff Present  Renita Papa, RN Moises Blood, BS, ACSM CEP, Exercise Physiologist;Amanda Oletta Darter, BA, ACSM CEP, Exercise Physiologist;Carroll Enterkin, RN, BSN    Supervising physician immediately available to respond to emergencies  See telemetry face sheet for immediately available ER MD    Medication changes reported      No    Fall or balance concerns reported     No    Warm-up and Cool-down  Performed on first and last piece of equipment    Resistance Training Performed  Yes    VAD Patient?  No      Pain Assessment   Currently in Pain?  No/denies    Multiple Pain Sites  No          Social History   Tobacco Use  Smoking Status Former Smoker  Smokeless Tobacco Never Used  Tobacco Comment   Quit 35 yeaers ago    Goals Met:  Independence with exercise equipment Exercise tolerated well No report of cardiac concerns or symptoms Strength training completed today  Goals Unmet:  Not Applicable  Comments: Pt able to follow exercise prescription today without complaint.  Will continue to monitor for progression.    Dr. Emily Filbert is Medical Director for [place] and [facility].

## 2017-07-11 ENCOUNTER — Encounter: Payer: PPO | Admitting: *Deleted

## 2017-07-11 DIAGNOSIS — I213 ST elevation (STEMI) myocardial infarction of unspecified site: Secondary | ICD-10-CM | POA: Diagnosis not present

## 2017-07-11 DIAGNOSIS — Z955 Presence of coronary angioplasty implant and graft: Secondary | ICD-10-CM

## 2017-07-11 NOTE — Progress Notes (Signed)
Daily Session Note  Patient Details  Name: Troy Chavez MRN: [id number] Date of Birth: 15-Jul-1945 Referring Provider:     Cardiac Rehab from 06/18/2017 in [facility] Cardiac and Pulmonary Rehab  Referring Provider  Kathlyn Sacramento MD      Encounter Date: 07/11/2017  Check In: Session Check In - 07/11/17 1640      Check-In   Location  [facility]-Cardiac & Pulmonary Rehab    Staff Present  Renita Papa, RN Vickki Hearing, BA, ACSM CEP, Exercise Physiologist;Carroll Enterkin, RN, BSN    Supervising physician immediately available to respond to emergencies  See telemetry face sheet for immediately available ER MD    Medication changes reported      No    Fall or balance concerns reported     No    Warm-up and Cool-down  Performed on first and last piece of equipment    Resistance Training Performed  Yes    VAD Patient?  No      Pain Assessment   Currently in Pain?  No/denies        Exercise Prescription Changes - 07/11/17 1200      Response to Exercise   Blood Pressure (Admit)  104/56    Blood Pressure (Exercise)  148/72    Heart Rate (Admit)  70 bpm    Heart Rate (Exercise)  112 bpm    Heart Rate (Exit)  49 bpm    Rating of Perceived Exertion (Exercise)  13    Symptoms  none      Progression   Progression  Continue to progress workloads to maintain intensity without signs/symptoms of physical distress.    Average METs  3.7      Resistance Training   Training Prescription  Yes    Weight  4 lb    Reps  10-15      Interval Training   Interval Training  No      Treadmill   MPH  3    Grade  0.5    Minutes  15    METs  3.7      REL-XR   Level  2    Speed  50    Minutes  15       Social History   Tobacco Use  Smoking Status Former Smoker  Smokeless Tobacco Never Used  Tobacco Comment   Quit 35 yeaers ago    Goals Met:  Independence with exercise equipment Exercise tolerated well No report of cardiac concerns or symptoms Strength training completed  today  Goals Unmet:  Not Applicable  Comments: Pt able to follow exercise prescription today without complaint.  Will continue to monitor for progression.    Dr. Emily Filbert is Medical Director for [place] and [facility].

## 2017-07-13 ENCOUNTER — Encounter: Payer: Self-pay | Admitting: Cardiovascular Disease

## 2017-07-13 ENCOUNTER — Ambulatory Visit: Payer: PPO | Admitting: Cardiovascular Disease

## 2017-07-13 VITALS — BP 130/76 | HR 54 | Ht 69.0 in | Wt 182.5 lb

## 2017-07-13 DIAGNOSIS — I2510 Atherosclerotic heart disease of native coronary artery without angina pectoris: Secondary | ICD-10-CM | POA: Diagnosis not present

## 2017-07-13 DIAGNOSIS — I255 Ischemic cardiomyopathy: Secondary | ICD-10-CM

## 2017-07-13 DIAGNOSIS — I10 Essential (primary) hypertension: Secondary | ICD-10-CM | POA: Diagnosis not present

## 2017-07-13 DIAGNOSIS — E785 Hyperlipidemia, unspecified: Secondary | ICD-10-CM | POA: Diagnosis not present

## 2017-07-13 NOTE — Progress Notes (Signed)
Cardiology Office Note   Date:  07/13/2017   ID:  WILGUS Troy Chavez, DOB 02-15-1946, MRN [id number]  PCP:  Troy Aus, MD  Cardiologist:   Troy Sacramento, MD   Chief Complaint  Patient presents with  . Other    1 month follow up. Patient c/o lower legs and foot. Meds reviewed verbally with patient.       History of Present Illness: Troy Chavez is a 72 y.o. male who presents for a follow-up visit regarding coronary artery disease.  He has known history of hypertension, hyperlipidemia, mild dementia and stage IV chronic kidney disease. He presented in January of this year with lateral ST elevation myocardial infarction.  Emergent cardiac catheterization showed occluded ostial and proximal ramus intermedius artery.  There was also 60% stenosis in the proximal LAD and 70% stenosis in the mid LAD. I performed successful angioplasty and drug-eluting stent placement to the ramus without complications.  Catheterization via the right radial artery was difficult due to severe tortuosity of the innominate artery. Echocardiogram showed an EF of 45-50%.   He has been doing well with no chest pain, shortness of breath or palpitations.  He continues to have easy bruising.  He is attending cardiac rehab.  He also complains of mild discomfort in his legs below the knees anteriorly.  Past Medical History:  Diagnosis Date  . Alzheimer's disease   . CKD (chronic kidney disease), stage IV ([place])   . Hypertension     Past Surgical History:  Procedure Laterality Date  . CORONARY/GRAFT ACUTE MI REVASCULARIZATION N/A 05/28/2017   Procedure: Coronary/Graft Acute MI Revascularization;  Surgeon: Wellington Hampshire, MD;  Location: [place] [facility];  Service: Cardiovascular;  Laterality: N/A;  . LEFT HEART CATH AND CORONARY ANGIOGRAPHY N/A 05/28/2017   Procedure: LEFT HEART CATH AND CORONARY ANGIOGRAPHY;  Surgeon: Wellington Hampshire, MD;  Location: [place] [facility];  Service: Cardiovascular;   Laterality: N/A;     Current Outpatient Medications  Medication Sig Dispense Refill  . amLODipine (NORVASC) 5 MG tablet Take 5 mg by mouth 2 (two) times daily.  11  . aspirin 81 MG chewable tablet Chew 1 tablet (81 mg total) by mouth daily. 30 tablet 0  . atorvastatin (LIPITOR) 80 MG tablet Take 1 tablet (80 mg total) by mouth daily at 6 PM. 30 tablet 0  . carvedilol (COREG) 3.125 MG tablet Take 1 tablet (3.125 mg total) by mouth 2 (two) times daily with a meal. 60 tablet 0  . galantamine (RAZADYNE) 8 MG tablet Take 8 mg by mouth 2 (two) times daily.  11  . pantoprazole (PROTONIX) 40 MG tablet Take 1 tablet (40 mg total) by mouth daily. 60 tablet 0  . tamsulosin (FLOMAX) 0.4 MG CAPS capsule Take 0.4 mg by mouth daily.    . ticagrelor (BRILINTA) 90 MG TABS tablet Take 1 tablet (90 mg total) by mouth 2 (two) times daily. 60 tablet 11   No current facility-administered medications for this visit.     Allergies:   Patient has no known allergies.    Social History:  The patient  reports that he has quit smoking. he has never used smokeless tobacco. He reports that he drinks alcohol. He reports that he does not use drugs.   Family History:  The patient's family history is not on file.    ROS:  Please see the history of present illness.   Otherwise, review of systems are positive for none.  All other systems are reviewed and negative.    PHYSICAL EXAM: VS:  BP 130/76 (BP Location: Left Arm, Patient Position: Sitting, Cuff Size: Normal)   Pulse (!) 54   Ht 5\' 9"  (1.753 m)   Wt 182 lb 8 oz (82.8 kg)   BMI 26.95 kg/m  , BMI Body mass index is 26.95 kg/m. GEN: Well nourished, well developed, in no acute distress  HEENT: normal  Neck: no JVD, carotid bruits, or masses Cardiac: RRR; no murmurs, rubs, or gallops,no edema  Respiratory:  clear to auscultation bilaterally, normal work of breathing GI: soft, nontender, nondistended, + BS MS: no deformity or atrophy  Skin: warm and dry, no  rash Neuro:  Strength and sensation are intact Psych: euthymic mood, full affect Distal pulses are normal bilaterally.  EKG:  EKG is ordered today. The ekg ordered today demonstrates sinus bradycardia with no significant ST or T wave changes.   Recent Labs: 05/28/2017: ALT 18 05/29/2017: Hemoglobin 13.5; Magnesium 1.9; Platelets 220 05/30/2017: BUN 24; Creatinine, Ser 1.99; Potassium 3.5; Sodium 138    Lipid Panel    Component Value Date/Time   CHOL 214 (H) 05/29/2017 0242   TRIG 82 05/29/2017 0242   HDL 52 05/29/2017 0242   CHOLHDL 4.1 05/29/2017 0242   VLDL 16 05/29/2017 0242   LDLCALC 146 (H) 05/29/2017 0242      Wt Readings from Last 3 Encounters:  07/13/17 182 lb 8 oz (82.8 kg)  06/18/17 181 lb 4.8 oz (82.2 kg)  06/07/17 181 lb 4 oz (82.2 kg)       No flowsheet data found.    ASSESSMENT AND PLAN:  1.  Coronary artery disease involving native coronary arteries without angina: He is overall doing well with no anginal symptoms.  He is attending cardiac rehab with no exertional symptoms. Continue dual antiplatelet therapy. He does have residual LAD disease but given the lack of angina as well as underlying chronic kidney disease, we will continue with medical therapy for now.  2.  Mild ischemic cardiomyopathy: Ejection fraction was 45-50%: Continue small dose carvedilol.  The dose cannot be increased due to bradycardia.  He is not on ACE inhibitor or ARB due to his stage IV chronic kidney disease.  3.  Essential hypertension: Blood pressure is controlled on amlodipine and carvedilol.  4.  Hyperlipidemia: He is going to have routine labs with his primary care physician in 1 week.  If LDL remains above 70, I recommend adding Zetia 10 mg daily.  5.  Atypical leg pain: Not consistent with claudication.  Distal pulses are normal.  Disposition:   FU with me in 3 months  Signed,  Troy Sacramento, MD  07/13/2017 11:50 AM    [place]

## 2017-07-13 NOTE — Patient Instructions (Signed)
Medication Instructions: Continue same medications.   Labwork: None.   Procedures/Testing: None.   Follow-Up: 3 months with Dr. Courtenay Creger.   Any Additional Special Instructions Will Be Listed Below (If Applicable).     If you need a refill on your cardiac medications before your next appointment, please call your pharmacy.   

## 2017-07-17 DIAGNOSIS — E782 Mixed hyperlipidemia: Secondary | ICD-10-CM | POA: Diagnosis not present

## 2017-07-17 DIAGNOSIS — N060 Isolated proteinuria with minor glomerular abnormality: Secondary | ICD-10-CM | POA: Diagnosis not present

## 2017-07-17 DIAGNOSIS — Z125 Encounter for screening for malignant neoplasm of prostate: Secondary | ICD-10-CM | POA: Diagnosis not present

## 2017-07-17 DIAGNOSIS — N184 Chronic kidney disease, stage 4 (severe): Secondary | ICD-10-CM | POA: Diagnosis not present

## 2017-07-18 DIAGNOSIS — I213 ST elevation (STEMI) myocardial infarction of unspecified site: Secondary | ICD-10-CM | POA: Diagnosis not present

## 2017-07-18 DIAGNOSIS — Z955 Presence of coronary angioplasty implant and graft: Secondary | ICD-10-CM

## 2017-07-18 NOTE — Progress Notes (Signed)
Daily Session Note  Patient Details  Name: Troy Chavez MRN: [id number] Date of Birth: [birth year] Referring Provider:     Cardiac Rehab from 06/18/2017 in [facility] Cardiac and Pulmonary Rehab  Referring Provider  Kathlyn Sacramento MD      Encounter Date: 07/18/2017  Check In: Session Check In - 07/18/17 1727      Check-In   Location  [facility]-Cardiac & Pulmonary Rehab    Staff Present  Renita Papa, RN Vickki Hearing, BA, ACSM CEP, Exercise Physiologist;Carroll Enterkin, RN, BSN    Supervising physician immediately available to respond to emergencies  See telemetry face sheet for immediately available ER MD    Medication changes reported      No    Fall or balance concerns reported     No    Warm-up and Cool-down  Performed on first and last piece of equipment    Resistance Training Performed  Yes    VAD Patient?  No      Pain Assessment   Currently in Pain?  No/denies    Multiple Pain Sites  No          Social History   Tobacco Use  Smoking Status Former Smoker  Smokeless Tobacco Never Used  Tobacco Comment   Quit 35 yeaers ago    Goals Met:  Independence with exercise equipment Exercise tolerated well No report of cardiac concerns or symptoms Strength training completed today  Goals Unmet:  Not Applicable  Comments: Pt able to follow exercise prescription today without complaint.  Will continue to monitor for progression.Reviewed home exercise with pt today.  Pt plans to walk for exercise.  Reviewed THR, pulse, RPE, sign and symptoms, NTG use, and when to call 911 or MD.  Also discussed weather considerations and indoor options.  Pt voiced understanding. .     Dr. Emily Filbert is Medical Director for [place] and [facility].

## 2017-07-19 DIAGNOSIS — B349 Viral infection, unspecified: Secondary | ICD-10-CM | POA: Diagnosis not present

## 2017-07-19 DIAGNOSIS — J40 Bronchitis, not specified as acute or chronic: Secondary | ICD-10-CM | POA: Diagnosis not present

## 2017-07-23 ENCOUNTER — Encounter: Payer: Self-pay | Admitting: *Deleted

## 2017-07-23 DIAGNOSIS — Z955 Presence of coronary angioplasty implant and graft: Secondary | ICD-10-CM

## 2017-07-23 DIAGNOSIS — I213 ST elevation (STEMI) myocardial infarction of unspecified site: Secondary | ICD-10-CM | POA: Diagnosis not present

## 2017-07-23 NOTE — Progress Notes (Signed)
Daily Session Note  Patient Details  Name: Troy Chavez MRN: [id number] Date of Birth: [birth year] Referring Provider:     Cardiac Rehab from 06/18/2017 in [facility] Cardiac and Pulmonary Rehab  Referring Provider  Kathlyn Sacramento MD      Encounter Date: 07/23/2017  Check In: Session Check In - 07/23/17 1718      Check-In   Location  [facility]Cardiac [facility] Pulmonary Re[facility]    Staff Present  Renita Papa, RN Moises Blood, BS, ACSM CEP, Exercise Physiologist;Amanda Oletta Darter, BA, ACSM CEP, Exercise Physiologist;Carroll Enterkin, RN, BSN    Supervising physician immediately available to respond to emergencies  See telemetry face sheet for immediately available ER MD    Medication changes reported      No    Fall or balance concerns reported     No    Warm-up and Cool-down  Performed on first and last piece of equipment    Resistance Training Performed  Yes    VAD Patient?  No      Pain Assessment   Currently in Pain?  No/denies    Multiple Pain Sites  No          Social History   Tobacco Use  Smoking Status Former Smoker  Smokeless Tobacco Never Used  Tobacco Comment   Quit 35 yeaers ago    Goals Met:  Independence with exercise equipment Exercise tolerated well No report of cardiac concerns or symptoms Strength training completed today  Goals Unmet:  Not Applicable  Comments: Pt able to follow exercise prescription today without complaint.  Will continue to monitor for progression.    Dr. Emily Filbert is Medical Director for [place] and [facility].

## 2017-07-24 DIAGNOSIS — Z0000 Encounter for general adult medical examination without abnormal findings: Secondary | ICD-10-CM | POA: Diagnosis not present

## 2017-07-24 DIAGNOSIS — N184 Chronic kidney disease, stage 4 (severe): Secondary | ICD-10-CM | POA: Diagnosis not present

## 2017-07-24 DIAGNOSIS — E782 Mixed hyperlipidemia: Secondary | ICD-10-CM | POA: Diagnosis not present

## 2017-07-30 ENCOUNTER — Telehealth: Payer: Self-pay | Admitting: Cardiovascular Disease

## 2017-07-30 ENCOUNTER — Encounter: Payer: PPO | Admitting: *Deleted

## 2017-07-30 DIAGNOSIS — I213 ST elevation (STEMI) myocardial infarction of unspecified site: Secondary | ICD-10-CM | POA: Diagnosis not present

## 2017-07-30 DIAGNOSIS — Z955 Presence of coronary angioplasty implant and graft: Secondary | ICD-10-CM

## 2017-07-30 NOTE — Telephone Encounter (Signed)
Pt calling stating he has had a stent placed. He is wanting to know how soon can he schedule an appointment with Dentist.   Please advise

## 2017-07-30 NOTE — Telephone Encounter (Signed)
The patient had a stent placed on 05/28/17.  I spoke with the patient's wife- she advised he is needing to go in June for a basic cleaning to be done. I advised her that at this point I would see no reason for him not to pursue a basic cleaning- he should not need SBE.  Will verify with Dr. Fletcher Anon.

## 2017-07-30 NOTE — Telephone Encounter (Signed)
I agree with you

## 2017-07-30 NOTE — Progress Notes (Signed)
Daily Session Note  Patient Details  Name: Troy Chavez MRN: [id number] Date of Birth: 07-13-1945 Referring Provider:     Cardiac Rehab from 06/18/2017 in [facility] Cardiac and Pulmonary Rehab  Referring Provider  Kathlyn Sacramento MD      Encounter Date: 07/30/2017  Check In: Session Check In - 07/30/17 1649      Check-In   Location  [facility]Cardiac [facility] Pulmonary Rehab    Staff Present  Nada Maclachlan, BA, ACSM CEP, Exercise Physiologist;Izic Stfort Amedeo Plenty, BS, ACSM CEP, Exercise Physiologist;Meredith Sherryll Burger, RN BSN;Carroll Enterkin, RN, BSN    Supervising physician immediately available to respond to emergencies  See telemetry face sheet for immediately available ER MD    Medication changes reported      No    Fall or balance concerns reported     No    Warm-up and Cool-down  Performed on first and last piece of equipment    Resistance Training Performed  Yes    VAD Patient?  No      Pain Assessment   Currently in Pain?  No/denies    Multiple Pain Sites  No          Social History   Tobacco Use  Smoking Status Former Smoker  Smokeless Tobacco Never Used  Tobacco Comment   Quit 35 yeaers ago    Goals Met:  Independence with exercise equipment Exercise tolerated well No report of cardiac concerns or symptoms Strength training completed today  Goals Unmet:  Not Applicable  Comments: Pt able to follow exercise prescription today without complaint.  Will continue to monitor for progression.    Dr. Emily Filbert is Medical Director for [place] and [facility].

## 2017-07-30 NOTE — Telephone Encounter (Signed)
The patient's wife is aware that the patient is ok for dental cleaning with no need for SBE at this time.

## 2017-08-01 ENCOUNTER — Encounter: Payer: Self-pay | Admitting: *Deleted

## 2017-08-01 DIAGNOSIS — I213 ST elevation (STEMI) myocardial infarction of unspecified site: Secondary | ICD-10-CM

## 2017-08-01 DIAGNOSIS — Z955 Presence of coronary angioplasty implant and graft: Secondary | ICD-10-CM

## 2017-08-01 NOTE — Progress Notes (Signed)
Daily Session Note  Patient Details  Name: Troy Chavez MRN: [id number] Date of Birth: 02/07/1946 Referring Provider:     Cardiac Rehab from 06/18/2017 in [facility] Cardiac and Pulmonary Rehab  Referring Provider  Arida, Muhammad MD      Encounter Date: 08/01/2017  Check In: Session Check In - 08/01/17 1736      Check-In   Location  [facility]iac [facility]monary Re[facility]    Staff Present  Meredith Craven, RN BSN;Amanda Sommer, BA, ACSM CEP, Exercise Physiologist;Carroll Enterkin, RN, BSN    Supervising physician immediately available to respond to emergencies  See telemetry face sheet for immediately available ER MD    Medication changes reported      No    Fall or balance concerns reported     No    Warm-up and Cool-down  Performed on first and last piece of equipment    Resistance Training Performed  Yes    VAD Patient?  No      Pain Assessment   Currently in Pain?  No/denies    Multiple Pain Sites  No          Social History   Tobacco Use  Smoking Status Former Smoker  Smokeless Tobacco Never Used  Tobacco Comment   Quit 35 yeaers ago    Goals Met:  Independence with exercise equipment Exercise tolerated well No report of cardiac concerns or symptoms Strength training completed today  Goals Unmet:  Not Applicable  Comments: Pt able to follow exercise prescription today without complaint.  Will continue to monitor for progression.    Dr. Mark Miller is Medical Director for [facility] and [facility]. 

## 2017-08-01 NOTE — Progress Notes (Signed)
Cardiac Individual Treatment Plan  Patient Details  Name: Troy Chavez MRN: [id number] Date of Birth: 1945-07-31 Referring Provider:     Cardiac Rehab from 06/18/2017 in [facility] Cardiac and Pulmonary Rehab  Referring Provider  Kathlyn Sacramento MD      Initial Encounter Date:    Cardiac Rehab from 06/18/2017 in [facility] Cardiac and Pulmonary Rehab  Date  06/18/17  Referring Provider  Kathlyn Sacramento MD      Visit Diagnosis: ST elevation myocardial infarction (STEMI), unspecified artery [facility])  Status post coronary artery stent placement  Patient's Home Medications on Admission:  Current Outpatient Medications:  .  amLODipine (NORVASC) 5 MG tablet, Take 5 mg by mouth 2 (two) times daily., Disp: , Rfl: 11 .  aspirin 81 MG chewable tablet, Chew 1 tablet (81 mg total) by mouth daily., Disp: 30 tablet, Rfl: 0 .  atorvastatin (LIPITOR) 80 MG tablet, Take 1 tablet (80 mg total) by mouth daily at 6 PM., Disp: 30 tablet, Rfl: 0 .  carvedilol (COREG) 3.125 MG tablet, Take 1 tablet (3.125 mg total) by mouth 2 (two) times daily with a meal., Disp: 60 tablet, Rfl: 0 .  galantamine (RAZADYNE) 8 MG tablet, Take 8 mg by mouth 2 (two) times daily., Disp: , Rfl: 11 .  pantoprazole (PROTONIX) 40 MG tablet, Take 1 tablet (40 mg total) by mouth daily., Disp: 60 tablet, Rfl: 0 .  tamsulosin (FLOMAX) 0.4 MG CAPS capsule, Take 0.4 mg by mouth daily., Disp: , Rfl:  .  ticagrelor (BRILINTA) 90 MG TABS tablet, Take 1 tablet (90 mg total) by mouth 2 (two) times daily., Disp: 60 tablet, Rfl: 11  Past Medical History: Past Medical History:  Diagnosis Date  . Alzheimer's disease   . CKD (chronic kidney disease), stage IV ([place])   . Hypertension     Tobacco Use: Social History   Tobacco Use  Smoking Status Former Smoker  Smokeless Tobacco Never Used  Tobacco Comment   Quit 35 yeaers ago    Labs: Recent Merchant navy officer for [employer] Cardiac and Pulmonary Rehab Latest Ref Rng & Units 05/29/2017    Cholestrol 0 - 200 mg/dL 214(H)   LDLCALC 0 - 99 mg/dL 146(H)   HDL >40 mg/dL 52   Trlycerides <150 mg/dL 82       Exercise Target Goals:    Exercise Program Goal: Individual exercise prescription set using results from initial 6 min walk test and THRR while considering  patient's activity barriers and safety.   Exercise Prescription Goal: Initial exercise prescription builds to 30-45 minutes a day of aerobic activity, 2-3 days per week.  Home exercise guidelines will be given to patient during program as part of exercise prescription that the participant will acknowledge.  Activity Barriers & Risk Stratification: Activity Barriers & Cardiac Risk Stratification - 06/18/17 1322      Activity Barriers & Cardiac Risk Stratification   Activity Barriers  Arthritis;Back Problems;Deconditioning;Muscular Weakness Weather changes can affect arthritis    Cardiac Risk Stratification  High       6 Minute Walk: 6 Minute Walk    Row Name 06/18/17 1425         6 Minute Walk   Phase  Initial     Distance  1590 feet     Walk Time  6 minutes     # of Rest Breaks  0     MPH  3.01     METS  3.79  RPE  11     VO2 Peak  11.8     Symptoms  No     Resting HR  61 bpm     Resting BP  134/62     Resting Oxygen Saturation   100 %     Exercise Oxygen Saturation  during 6 min walk  100 %     Max Ex. HR  100 bpm     Max Ex. BP  138/64     2 Minute Post BP  124/60        Oxygen Initial Assessment:   Oxygen Re-Evaluation:   Oxygen Discharge (Final Oxygen Re-Evaluation):   Initial Exercise Prescription: Initial Exercise Prescription - 06/18/17 1400      Date of Initial Exercise RX and Referring Provider   Date  06/18/17    Referring Provider  Kathlyn Sacramento MD      Treadmill   MPH  3    Grade  0.5    Minutes  15    METs  3.5      Recumbant Bike   Level  3    RPM  40    Watts  36    Minutes  15    METs  3.5      REL-XR   Level  2    Speed  50    Minutes  15     METs  3.5      Prescription Details   Frequency (times per week)  3    Duration  Progress to 45 minutes of aerobic exercise without signs/symptoms of physical distress      Intensity   THRR 40-80% of Max Heartrate  95-130    Ratings of Perceived Exertion  11-13    Perceived Dyspnea  0-4      Progression   Progression  Continue to progress workloads to maintain intensity without signs/symptoms of physical distress.      Resistance Training   Training Prescription  Yes    Weight  4 lbs    Reps  10-15       Perform Capillary Blood Glucose checks as needed.  Exercise Prescription Changes: Exercise Prescription Changes    Row Name 06/18/17 1300 07/11/17 1200 07/25/17 1200         Response to Exercise   Blood Pressure (Admit)  134/62  104/56  118/78     Blood Pressure (Exercise)  138/64  148/72  144/74     Blood Pressure (Exit)  124/60  -  124/70     Heart Rate (Admit)  61 bpm  70 bpm  80 bpm     Heart Rate (Exercise)  100 bpm  112 bpm  110 bpm     Heart Rate (Exit)  59 bpm  49 bpm  65 bpm     Oxygen Saturation (Admit)  100 %  -  -     Oxygen Saturation (Exercise)  100 %  -  -     Rating of Perceived Exertion (Exercise)  '11  13  13     ' Symptoms  none  none  none     Comments  walk test results  -  -     Duration  -  -  Continue with 45 min of aerobic exercise without signs/symptoms of physical distress.     Intensity  -  -  THRR unchanged       Progression   Progression  -  Continue to progress workloads  to maintain intensity without signs/symptoms of physical distress.  Continue to progress workloads to maintain intensity without signs/symptoms of physical distress.     Average METs  -  3.7  3.77       Resistance Training   Training Prescription  -  Yes  Yes     Weight  -  4 lb  4 lb     Reps  -  10-15  10-15       Interval Training   Interval Training  -  No  No       Treadmill   MPH  -  3  -     Grade  -  0.5  -     Minutes  -  15  -     METs  -  3.7  -        REL-XR   Level  -  2  4     Speed  -  50  50     Minutes  -  15  15     METs  -  -  3.77        Exercise Comments: Exercise Comments    Row Name 06/27/17 1629 07/18/17 1731         Exercise Comments  First full day of exercise!  Patient was oriented to gym and equipment including functions, settings, policies, and procedures.  Patient's individual exercise prescription and treatment plan were reviewed.  All starting workloads were established based on the results of the 6 minute walk test done at initial orientation visit.  The plan for exercise progression was also introduced and progression will be customized based on patient's performance and goals  Reviewed home exercise with pt today.  Pt plans to walk for exercise.  Reviewed THR, pulse, RPE, sign and symptoms, NTG use, and when to call 911 or MD.  Also discussed weather considerations and indoor options.  Pt voiced understanding.         Exercise Goals and Review: Exercise Goals    Row Name 06/18/17 1428             Exercise Goals   Increase Physical Activity  Yes       Intervention  Provide advice, education, support and counseling about physical activity/exercise needs.;Develop an individualized exercise prescription for aerobic and resistive training based on initial evaluation findings, risk stratification, comorbidities and participant's personal goals.       Expected Outcomes  Short Term: Attend rehab on a regular basis to increase amount of physical activity.;Long Term: Add in home exercise to make exercise part of routine and to increase amount of physical activity.;Long Term: Exercising regularly at least 3-5 days a week.       Increase Strength and Stamina  Yes       Intervention  Provide advice, education, support and counseling about physical activity/exercise needs.;Develop an individualized exercise prescription for aerobic and resistive training based on initial evaluation findings, risk stratification,  comorbidities and participant's personal goals.       Expected Outcomes  Short Term: Increase workloads from initial exercise prescription for resistance, speed, and METs.;Short Term: Perform resistance training exercises routinely during rehab and add in resistance training at home;Long Term: Improve cardiorespiratory fitness, muscular endurance and strength as measured by increased METs and functional capacity (6MWT)       Able to understand and use rate of perceived exertion (RPE) scale  Yes       Intervention  Provide education and explanation on how to use RPE scale       Expected Outcomes  Short Term: Able to use RPE daily in rehab to express subjective intensity level;Long Term:  Able to use RPE to guide intensity level when exercising independently       Knowledge and understanding of Target Heart Rate Range (THRR)  Yes       Intervention  Provide education and explanation of THRR including how the numbers were predicted and where they are located for reference       Expected Outcomes  Short Term: Able to state/look up THRR;Long Term: Able to use THRR to govern intensity when exercising independently;Short Term: Able to use daily as guideline for intensity in rehab       Able to check pulse independently  Yes       Intervention  Provide education and demonstration on how to check pulse in carotid and radial arteries.;Review the importance of being able to check your own pulse for safety during independent exercise       Expected Outcomes  Short Term: Able to explain why pulse checking is important during independent exercise;Long Term: Able to check pulse independently and accurately       Understanding of Exercise Prescription  Yes       Intervention  Provide education, explanation, and written materials on patient's individual exercise prescription       Expected Outcomes  Short Term: Able to explain program exercise prescription;Long Term: Able to explain home exercise prescription to  exercise independently          Exercise Goals Re-Evaluation : Exercise Goals Re-Evaluation    Row Name 06/27/17 1629 07/11/17 1246 07/18/17 1731 07/25/17 1240       Exercise Goal Re-Evaluation   Exercise Goals Review  Able to understand and use rate of perceived exertion (RPE) scale;Knowledge and understanding of Target Heart Rate Range (THRR);Understanding of Exercise Prescription  Increase Physical Activity;Able to understand and use rate of perceived exertion (RPE) scale;Knowledge and understanding of Target Heart Rate Range (THRR);Increase Strength and Stamina  Increase Physical Activity;Increase Strength and Stamina;Able to understand and use rate of perceived exertion (RPE) scale;Knowledge and understanding of Target Heart Rate Range (THRR);Able to check pulse independently;Understanding of Exercise Prescription  Increase Physical Activity;Able to understand and use rate of perceived exertion (RPE) scale;Knowledge and understanding of Target Heart Rate Range (THRR);Increase Strength and Stamina    Comments  Reviewed RPE scale, THR and program prescription with pt today.  Pt voiced understanding and was given a copy of goals to take home.   Pt is tolerating exercise well and reaching THR range during exercise  Reviewed home exercise with pt today.  Pt plans to walk for exercise.  Reviewed THR, pulse, RPE, sign and symptoms, NTG use, and when to call 911 or MD.  Also discussed weather considerations and indoor options.  Pt voiced understanding.  Luciana Axe is progressing well with exercise and has increased resistance on the XR. Staff wil add intervals to his program    Expected Outcomes  Short: Use RPE daily to regulate intensity.  Long: Follow program prescription in THR.  Short - Pt will attend regularly Long - Pt will increase overall MET level  Short - Pt will continue to walk on days not at [facility] and will monitor RPE and HR Long - Pt will maintain exercise independently  Short  -Luciana Axe will add interval  training Long - Luciana Axe will increase oiverall MET level  Discharge Exercise Prescription (Final Exercise Prescription Changes): Exercise Prescription Changes - 07/25/17 1200      Response to Exercise   Blood Pressure (Admit)  118/78    Blood Pressure (Exercise)  144/74    Blood Pressure (Exit)  124/70    Heart Rate (Admit)  80 bpm    Heart Rate (Exercise)  110 bpm    Heart Rate (Exit)  65 bpm    Rating of Perceived Exertion (Exercise)  13    Symptoms  none    Duration  Continue with 45 min of aerobic exercise without signs/symptoms of physical distress.    Intensity  THRR unchanged      Progression   Progression  Continue to progress workloads to maintain intensity without signs/symptoms of physical distress.    Average METs  3.77      Resistance Training   Training Prescription  Yes    Weight  4 lb    Reps  10-15      Interval Training   Interval Training  No      REL-XR   Level  4    Speed  50    Minutes  15    METs  3.77       Nutrition:  Target Goals: Understanding of nutrition guidelines, daily intake of sodium <1559m, cholesterol <2058m calories 30% from fat and 7% or less from saturated fats, daily to have 5 or more servings of fruits and vegetables.  Biometrics: Pre Biometrics - 06/18/17 1428      Pre Biometrics   Height  5' 8.9" (1.75 m)    Weight  181 lb 4.8 oz (82.2 kg)    Waist Circumference  37.5 inches    Hip Circumference  42 inches    Waist to Hip Ratio  0.89 %    BMI (Calculated)  26.85    Single Leg Stand  25.92 seconds        Nutrition Therapy Plan and Nutrition Goals: Nutrition Therapy & Goals - 06/18/17 1300      Intervention Plan   Intervention  Prescribe, educate and counsel regarding individualized specific dietary modifications aiming towards targeted core components such as weight, hypertension, lipid management, diabetes, heart failure and other comorbidities.    Expected Outcomes  Short Term Goal: Understand basic  principles of dietary content, such as calories, fat, sodium, cholesterol and nutrients.;Short Term Goal: A plan has been developed with personal nutrition goals set during dietitian appointment.;Long Term Goal: Adherence to prescribed nutrition plan.       Nutrition Assessments: Nutrition Assessments - 06/18/17 1300      MEDFICTS Scores   Pre Score  43       Nutrition Goals Re-Evaluation: Nutrition Goals Re-Evaluation    Row Name 07/23/17 1406             Goals   Current Weight  174 lb (78.9 kg)       Nutrition Goal  [place] healthy eating already even when they got out to eat and [place] that his wife cooks healthy and is a good coTraining and development officer      Expected Outcome  Heart healthy eating.           Nutrition Goals Discharge (Final Nutrition Goals Re-Evaluation): Nutrition Goals Re-Evaluation - 07/23/17 1406      Goals   Current Weight  174 lb (78.9 kg)    Nutrition Goal  [place] healthy eating already even when they got out to eat and [place] that  his wife cooks healthy and is a good Training and development officer.    Expected Outcome  Heart healthy eating.        Psychosocial: Target Goals: Acknowledge presence or absence of significant depression and/or stress, maximize coping skills, provide positive support system. Participant is able to verbalize types and ability to use techniques and skills needed for reducing stress and depression.   Initial Review & Psychosocial Screening: Initial Psych Review & Screening - 06/18/17 1301      Initial Review   Current issues with  Current Sleep Concerns Sleep concerns are chronic. Trouble getting to sleep      [place]?  Yes Spouse Becky       Barriers   Psychosocial barriers to participate in program  There are no identifiable barriers or psychosocial needs.;The patient should benefit from training in stress management and relaxation.      Screening Interventions   Interventions  Encouraged to exercise;Provide  feedback about the scores to participant    Expected Outcomes  Short Term goal: Utilizing psychosocial counselor, staff and physician to assist with identification of specific Stressors or current issues interfering with healing process. Setting desired goal for each stressor or current issue identified.;Long Term Goal: Stressors or current issues are controlled or eliminated.;Short Term goal: Identification and review with participant of any Quality of Life or Depression concerns found by scoring the questionnaire.;Long Term goal: The participant improves quality of Life and PHQ9 Scores as seen by post scores and/or verbalization of changes       Quality of Life Scores:  Quality of Life - 06/18/17 1303      Quality of Life Scores   Health/Function Pre  23.46 %    Socioeconomic Pre  20.93 %    Psych/Spiritual Pre  24.21 %    Family Pre  19.8 %    GLOBAL Pre  22.53 %      Scores of 19 and below usually indicate a poorer quality of life in these areas.  A difference of  2-3 points is a clinically meaningful difference.  A difference of 2-3 points in the total score of the Quality of Life Index has been associated with significant improvement in overall quality of life, self-image, physical symptoms, and general health in studies assessing change in quality of life.  PHQ-9: Recent Review Flowsheet Data    Depression screen [facility] 2/9 06/18/2017   Decreased Interest 0   Down, Depressed, Hopeless 0   PHQ - 2 Score 0   Altered sleeping 2   Tired, decreased energy 0   Change in appetite 0   Feeling bad or failure about yourself  0   Trouble concentrating 0   Moving slowly or fidgety/restless 0   Suicidal thoughts 0   PHQ-9 Score 2   Difficult doing work/chores Not difficult at all     Interpretation of Total Score  Total Score Depression Severity:  1-4 = Minimal depression, 5-9 = Mild depression, 10-14 = Moderate depression, 15-19 = Moderately severe depression, 20-27 = Severe depression    Psychosocial Evaluation and Intervention: Psychosocial Evaluation - 07/09/17 1711      Psychosocial Evaluation & Interventions   Interventions  Encouraged to exercise with the program and follow exercise prescription    Comments  Counselor met with Mr. Miskell Luciana Axe) today for initial psychosocial evaluation.  He is a 72 year old who had a heart attack and stent inserted several weeks ago.  He has a strong support  system with a spouse of 41 years; several step daughters locally as well as a son who lives in the [place] area.  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  He sleeps well and has a good appetite.  Luciana Axe denies a history of depression or anxiety or any current symptoms.  He has minimal stress in his life and states he is typically in a positive mood.  Luciana Axe has goals to increase his stamina and return to his normal daily activities.  He will be followed by staff.      Expected Outcomes  Luciana Axe will benefit from consistent exercise to achieve his stated goals.  The educational and psychoeducational components of this program will be helpful in understanding and managing his heart condition.      Continue Psychosocial Services   Follow up required by staff       Psychosocial Re-Evaluation: Psychosocial Re-Evaluation    [place] Name 07/23/17 1405             Psychosocial Re-Evaluation   Comments  Luciana Axe and his wife feel Cardiac REhab is "enjoyable for [employer] and he likes it.        Interventions  Encouraged to attend Cardiac Rehabilitation for the exercise          Psychosocial Discharge (Final Psychosocial Re-Evaluation): Psychosocial Re-Evaluation - 07/23/17 1405      Psychosocial Re-Evaluation   Comments  Luciana Axe and his wife feel Cardiac REhab is "enjoyable for [employer] and he likes it.     Interventions  Encouraged to attend Cardiac Rehabilitation for the  exercise       Vocational Rehabilitation: Provide vocational rehab assistance to qualifying candidates.   Vocational Rehab Evaluation & Intervention: Vocational Rehab - 06/18/17 1311      Initial Vocational Rehab Evaluation & Intervention   Assessment shows need for Vocational Rehabilitation  No       Education: Education Goals: Education classes will be provided on a variety of topics geared toward better understanding of heart health and risk factor modification. Participant will state understanding/return demonstration of topics presented as noted by education test scores.  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   General Nutrition Guidelines/Fats and Fiber: -Group instruction provided by verbal, written material, models and posters to present the general guidelines for heart healthy nutrition. Gives an explanation and review of dietary fats and fiber.   Cardiac Rehab from 07/30/2017 in [facility] Cardiac and Pulmonary Rehab  Date  07/30/17  Educator  PI  Instruction Review Code  1- Verbalizes Understanding      Controlling Sodium/Reading Food Labels: -Group verbal and written material supporting the discussion of sodium use in heart healthy nutrition. Review and explanation with models, verbal and written materials for utilization of the food label.   Exercise Physiology & General Exercise Guidelines: - Group verbal and written instruction with models to review the exercise physiology of the cardiovascular system and associated critical values. Provides general exercise guidelines with specific  guidelines to those with heart or  lung disease.    Aerobic Exercise & Resistance Training: - Gives group verbal and written instruction on the various components of exercise. Focuses on aerobic and resistive training programs and the benefits of this training and how to safely progress through these programs..   Cardiac Rehab from 07/30/2017 in [facility] Cardiac and Pulmonary Rehab  Date  07/02/17  Educator  AS  Instruction Review Code  1- Verbalizes Understanding      Flexibility, Balance, Mind/Body Relaxation: Provides group verbal/written instruction on the benefits of flexibility and balance training, including mind/body exercise modes such as yoga, pilates and tai chi.  Demonstration and skill practice provided.   Stress and Anxiety: - Provides group verbal and written instruction about the health risks of elevated stress and causes of high stress.  Discuss the correlation between heart/lung disease and anxiety and treatment options. Review healthy ways to manage with stress and anxiety.   Cardiac Rehab from 07/30/2017 in [facility] Cardiac and Pulmonary Rehab  Date  07/11/17  Educator  [facility]  Instruction Review Code  1- Verbalizes Understanding      Depression: - Provides group verbal and written instruction on the correlation between heart/lung disease and depressed mood, treatment options, and the stigmas associated with seeking treatment.   Cardiac Rehab from 07/30/2017 in [facility] Cardiac and Pulmonary Rehab  Date  06/27/17  Educator  [facility]  Instruction Review Code  1- Verbalizes Understanding      Anatomy & Physiology of the Heart: - Group verbal and written instruction and models provide basic cardiac anatomy and physiology, with the coronary electrical and arterial systems. Review of Valvular disease and Heart Failure   Cardiac Rehab from 07/30/2017 in [facility] Cardiac and Pulmonary Rehab  Date  07/23/17  Educator  CE  Instruction Review Code  1- Verbalizes Understanding      Cardiac  Procedures: - Group verbal and written instruction to review commonly prescribed medications for heart disease. Reviews the medication, class of the drug, and side effects. Includes the steps to properly store meds and maintain the prescription regimen. (beta blockers and nitrates)   Cardiac Medications I: - Group verbal and written instruction to review commonly prescribed medications for heart disease. Reviews the medication, class of the drug, and side effects. Includes the steps to properly store meds and maintain the prescription regimen.   Cardiac Medications II: -Group verbal and written instruction to review commonly prescribed medications for heart disease. Reviews the medication, class of the drug, and side effects. (all other drug classes)   Cardiac Rehab from 07/30/2017 in [facility] Cardiac and Pulmonary Rehab  Date  07/09/17  Educator  CE  Instruction Review Code  1- Verbalizes Understanding       Go Sex-Intimacy & Heart Disease, Get SMART - Goal Setting: - Group verbal and written instruction through game format to discuss heart disease and the return to sexual intimacy. Provides group verbal and written material to discuss and apply goal setting through the application of the S.M.A.R.T. Method.   Other Matters of the Heart: - Provides group verbal, written materials and models to describe Stable Angina and Peripheral Artery. Includes description of the disease process and treatment options available to the cardiac patient.   Exercise & Equipment Safety: - Individual verbal instruction and demonstration of equipment use and safety with use of the equipment.   Cardiac Rehab from 07/30/2017 in [facility] Cardiac and Pulmonary Rehab  Date  06/18/17  Educator  [facility]  Instruction Review Code  1- Verbalizes Understanding  Infection Prevention: - Provides verbal and written material to individual with discussion of infection control including proper hand washing and proper equipment  cleaning during exercise session.   Cardiac Rehab from 07/30/2017 in [facility] Cardiac and Pulmonary Rehab  Date  06/18/17  Educator  [facility]  Instruction Review Code  1- Verbalizes Understanding      Falls Prevention: - Provides verbal and written material to individual with discussion of falls prevention and safety.   Cardiac Rehab from 07/30/2017 in [facility] Cardiac and Pulmonary Rehab  Date  06/18/17  Educator  [facility]  Instruction Review Code  1- Verbalizes Understanding      Diabetes: - Individual verbal and written instruction to review signs/symptoms of diabetes, desired ranges of glucose level fasting, after meals and with exercise. Acknowledge that pre and post exercise glucose checks will be done for 3 sessions at entry of program.   Know Your Numbers and Risk Factors: -Group verbal and written instruction about important numbers in your health.  Discussion of what are risk factors and how they play a role in the disease process.  Review of Cholesterol, Blood Pressure, Diabetes, and BMI and the role they play in your overall health.   Cardiac Rehab from 07/30/2017 in [facility] Cardiac and Pulmonary Rehab  Date  07/09/17  Educator  CE  Instruction Review Code  1- Verbalizes Understanding      Sleep Hygiene: -Provides group verbal and written instruction about how sleep can affect your health.  Define sleep hygiene, discuss sleep cycles and impact of sleep habits. Review good sleep hygiene tips.    Other: -Provides group and verbal instruction on various topics (see comments)   Knowledge Questionnaire Score: Knowledge Questionnaire Score - 06/18/17 1311      Knowledge Questionnaire Score   Pre Score  19/28 reviewed correct responses with Luciana Axe today. He verbalized understanding and had no further questions       Core Components/Risk Factors/Patient Goals at Admission: Personal Goals and Risk Factors at Admission - 06/18/17 1301      Core Components/Risk Factors/Patient Goals on  Admission    Weight Management  Yes;Weight Loss    Intervention  Weight Management: Develop a combined nutrition and exercise program designed to reach desired caloric intake, while maintaining appropriate intake of nutrient and fiber, sodium and fats, and appropriate energy expenditure required for the weight goal.;Weight Management: Provide education and appropriate resources to help participant work on and attain dietary goals.    Admit Weight  181 lb 4.8 oz (82.2 kg)    Goal Weight: Short Term  176 lb (79.8 kg)    Goal Weight: Long Term  171 lb (77.6 kg)    Expected Outcomes  Short Term: Continue to assess and modify interventions until short term weight is achieved;Long Term: Adherence to nutrition and physical activity/exercise program aimed toward attainment of established weight goal;Weight Loss: Understanding of general recommendations for a balanced deficit meal plan, which promotes 1-2 lb weight loss per week and includes a negative energy balance of [id number] kcal/d;Understanding recommendations for meals to include 15-35% energy as protein, 25-35% energy from fat, 35-60% energy from carbohydrates, less than 220m of dietary cholesterol, 20-35 gm of total fiber daily;Understanding of distribution of calorie intake throughout the day with the consumption of 4-5 meals/snacks    Hypertension  Yes    Intervention  Provide education on lifestyle modifcations including regular physical activity/exercise, weight management, moderate sodium restriction and increased consumption of fresh fruit, vegetables, and low fat dairy, alcohol moderation,  and smoking cessation.;Monitor prescription use compliance.    Expected Outcomes  Short Term: Continued assessment and intervention until BP is < 140/3m HG in hypertensive participants. < 130/892mHG in hypertensive participants with diabetes, heart failure or chronic kidney disease.;Long Term: Maintenance of blood pressure at goal levels.    Lipids  Yes     Intervention  Provide education and support for participant on nutrition & aerobic/resistive exercise along with prescribed medications to achieve LDL <7031mHDL >38m87m  Expected Outcomes  Short Term: Participant states understanding of desired cholesterol values and is compliant with medications prescribed. Participant is following exercise prescription and nutrition guidelines.;Long Term: Cholesterol controlled with medications as prescribed, with individualized exercise RX and with personalized nutrition plan. Value goals: LDL < 70mg88mL > 40 mg.       Core Components/Risk Factors/Patient Goals Review:  Goals and Risk Factor Review    Row Name 07/23/17 1401             Core Components/Risk Factors/Patient Goals Review   Personal Goals Review  Weight Management/Obesity;Lipids;Hypertension       Review  weight is currently down to 174lbs from 186lbs. [place]eg[place]"Luciana Axels things are going "good. Boyd Luciana Axe he already eats healthy alreaady even if they go out they eat salmon or a salad. Boyd Luciana Axesick with a virus on THursday and got Augmentin from his doctor. Did not have a fever and is feeling better today. HIs blood pressure has been good. MD follows his lipid blood work which he feels it will be good again since he eats good.        Expected Outcomes  Heart healthy weight and blood pressure.          Core Components/Risk Factors/Patient Goals at Discharge (Final Review):  Goals and Risk Factor Review - 07/23/17 1401      Core Components/Risk Factors/Patient Goals Review   Personal Goals Review  Weight Management/Obesity;Lipids;Hypertension    Review  weight is currently down to 174lbs from 186lbs. [place]"Luciana Axels things are going "good. Boyd Luciana Axe he already eats healthy alreaady even if they go out they eat salmon or a salad. Boyd Luciana Axesick with a virus on THursday and got Augmentin from his doctor. Did not have a fever and is feeling better today. HIs blood pressure has been good. MD  follows his lipid blood work which he feels it will be good again since he eats good.     Expected Outcomes  Heart healthy weight and blood pressure.       ITP Comments: ITP Comments    Row Name 06/18/17 1257 07/04/17 0601 08/01/17 0649       ITP Comments  Medical review completed. ITP sent to Dr M MaiFrederik Pearview, make changes as needed and sign.   Documentation of diagnosis can be found [facility] 05/28/2017 encounter  30 day review. Continue with ITP unless directed changes per Medical Director review.  New to program  2 visits   30 Day review. Continue with ITP unless directed changes per Medical Director review.          Comments:

## 2017-08-02 DIAGNOSIS — I213 ST elevation (STEMI) myocardial infarction of unspecified site: Secondary | ICD-10-CM | POA: Diagnosis not present

## 2017-08-02 NOTE — Progress Notes (Signed)
Daily Session Note  Patient Details  Name: CHALES PELISSIER MRN: [id number] Date of Birth: 1945-10-14 Referring Provider:     Cardiac Rehab from 06/18/2017 in [facility] Cardiac and Pulmonary Rehab  Referring Provider  Kathlyn Sacramento MD      Encounter Date: 08/02/2017  Check In: Session Check In - 08/02/17 1648      Check-In   Location  [facility]-Cardiac & Pulmonary Rehab    Staff Present  Justin Mend RCP,RRT,BSRT;Laureen Owens Shark, BS, RRT, Respiratory Therapist;Meredith Sherryll Burger, RN BSN    Supervising physician immediately available to respond to emergencies  See telemetry face sheet for immediately available ER MD    Medication changes reported      No    Fall or balance concerns reported     No    Tobacco Cessation  No Change    Warm-up and Cool-down  Performed on first and last piece of equipment    Resistance Training Performed  Yes    VAD Patient?  No      Pain Assessment   Currently in Pain?  No/denies          Social History   Tobacco Use  Smoking Status Former Smoker  Smokeless Tobacco Never Used  Tobacco Comment   Quit 35 yeaers ago    Goals Met:  Independence with exercise equipment Exercise tolerated well No report of cardiac concerns or symptoms Strength training completed today  Goals Unmet:  Not Applicable  Comments: Pt able to follow exercise prescription today without complaint.  Will continue to monitor for progression.   Dr. Emily Filbert is Medical Director for [place] and [facility].

## 2017-08-06 ENCOUNTER — Telehealth: Payer: Self-pay | Admitting: Cardiovascular Disease

## 2017-08-06 ENCOUNTER — Other Ambulatory Visit: Payer: Self-pay

## 2017-08-06 ENCOUNTER — Encounter: Payer: PPO | Attending: Cardiovascular Disease | Admitting: *Deleted

## 2017-08-06 DIAGNOSIS — I213 ST elevation (STEMI) myocardial infarction of unspecified site: Secondary | ICD-10-CM | POA: Diagnosis not present

## 2017-08-06 DIAGNOSIS — Z955 Presence of coronary angioplasty implant and graft: Secondary | ICD-10-CM | POA: Diagnosis not present

## 2017-08-06 MED ORDER — TICAGRELOR 90 MG PO TABS: 90.0000 mg | ORAL_TABLET | Freq: Two times a day (BID) | ORAL | 0 refills | Status: DC

## 2017-08-06 NOTE — Telephone Encounter (Signed)
ticagrelor (BRILINTA) 90 MG TABS tablet 180 tablet 0 08/06/2017    Sig - Route: Take 1 tablet (90 mg total) by mouth 2 (two) times daily. - Oral   Sent to pharmacy as: ticagrelor (BRILINTA) 90 MG Tab tablet   E-Prescribing Status: Sent to pharmacy (08/06/2017 12:56 PM EDT)   [facility] [id number] - [place], [place]

## 2017-08-06 NOTE — Progress Notes (Signed)
Daily Session Note  Patient Details  Name: Troy Chavez MRN: [id number] Date of Birth: 12/10/1945 Referring Provider:     Cardiac Rehab from 06/18/2017 in [facility] Cardiac and Pulmonary Rehab  Referring Provider  Kathlyn Sacramento MD      Encounter Date: 08/06/2017  Check In: Session Check In - 08/06/17 1640      Check-In   Location  [facility]-Cardiac & Pulmonary Rehab    Staff Present  Earlean Shawl, BS, ACSM CEP, Exercise Physiologist;Amanda Oletta Darter, BA, ACSM CEP, Exercise Physiologist;Carroll Enterkin, RN, BSN    Supervising physician immediately available to respond to emergencies  See telemetry face sheet for immediately available ER MD    Medication changes reported      No    Fall or balance concerns reported     No    Warm-up and Cool-down  Performed on first and last piece of equipment    Resistance Training Performed  Yes    VAD Patient?  No      Pain Assessment   Currently in Pain?  No/denies    Multiple Pain Sites  No          Social History   Tobacco Use  Smoking Status Former Smoker  Smokeless Tobacco Never Used  Tobacco Comment   Quit 35 yeaers ago    Goals Met:  Independence with exercise equipment Exercise tolerated well No report of cardiac concerns or symptoms Strength training completed today  Goals Unmet:  Not Applicable  Comments: Pt able to follow exercise prescription today without complaint.  Will continue to monitor for progression.    Dr. Emily Filbert is Medical Director for [place] and [facility].

## 2017-08-06 NOTE — Telephone Encounter (Signed)
°*  STAT* If patient is at the pharmacy, call can be transferred to refill team.   1. Which medications need to be refilled? (please list name of each medication and dose if known) Brilinta   2. Which pharmacy/location (including street and city if local pharmacy) is medication to be sent to? [facility] on [address]   3. Do they need a 30 day or 90 day supply? 90day

## 2017-08-09 ENCOUNTER — Encounter: Payer: PPO | Admitting: *Deleted

## 2017-08-09 DIAGNOSIS — I213 ST elevation (STEMI) myocardial infarction of unspecified site: Secondary | ICD-10-CM

## 2017-08-09 DIAGNOSIS — Z955 Presence of coronary angioplasty implant and graft: Secondary | ICD-10-CM

## 2017-08-09 NOTE — Progress Notes (Signed)
Daily Session Note  Patient Details  Name: Troy Chavez MRN: [id number] Date of Birth: 19-Aug-1945 Referring Provider:     Cardiac Rehab from 06/18/2017 in [facility] Cardiac and Pulmonary Rehab  Referring Provider  Kathlyn Sacramento MD      Encounter Date: 08/09/2017  Check In: Session Check In - 08/09/17 1704      Check-In   Location  [facility]-Cardiac & Pulmonary Rehab    Staff Present  Earlean Shawl, BS, ACSM CEP, Exercise Physiologist;Mary Kellie Shropshire, RN, BSN, Donavan Foil, RN, BSN, CCRP    Supervising physician immediately available to respond to emergencies  See telemetry face sheet for immediately available ER MD    Medication changes reported      No    Fall or balance concerns reported     No    Warm-up and Cool-down  Performed on first and last piece of equipment    Resistance Training Performed  Yes    VAD Patient?  No      Pain Assessment   Currently in Pain?  No/denies    Multiple Pain Sites  No          Social History   Tobacco Use  Smoking Status Former Smoker  Smokeless Tobacco Never Used  Tobacco Comment   Quit 35 yeaers ago    Goals Met:  Independence with exercise equipment Exercise tolerated well No report of cardiac concerns or symptoms Strength training completed today  Goals Unmet:  Not Applicable  Comments: Pt able to follow exercise prescription today without complaint.  Will continue to monitor for progression.    Dr. Emily Filbert is Medical Director for [place] and [facility].

## 2017-08-13 DIAGNOSIS — Z955 Presence of coronary angioplasty implant and graft: Secondary | ICD-10-CM

## 2017-08-13 DIAGNOSIS — I213 ST elevation (STEMI) myocardial infarction of unspecified site: Secondary | ICD-10-CM

## 2017-08-13 NOTE — Progress Notes (Signed)
Daily Session Note  Patient Details  Name: Troy Chavez MRN: [id number] Date of Birth: [birth year] Referring Provider:     Cardiac Rehab from 06/18/2017 in [facility] Cardiac and Pulmonary Rehab  Referring Provider  Kathlyn Sacramento MD      Encounter Date: 08/13/2017  Check In: Session Check In - 08/13/17 1708      Check-In   Location  [facility]-Cardiac & Pulmonary Rehab    Staff Present  Earlean Shawl, BS, ACSM CEP, Exercise Physiologist;Eduard Penkala Oletta Darter, BA, ACSM CEP, Exercise Physiologist;Carroll Enterkin, RN, BSN    Supervising physician immediately available to respond to emergencies  See telemetry face sheet for immediately available ER MD    Medication changes reported      No    Fall or balance concerns reported     No    Warm-up and Cool-down  Performed on first and last piece of equipment    Resistance Training Performed  Yes    VAD Patient?  No      Pain Assessment   Currently in Pain?  No/denies    Multiple Pain Sites  No          Social History   Tobacco Use  Smoking Status Former Smoker  Smokeless Tobacco Never Used  Tobacco Comment   Quit 35 yeaers ago    Goals Met:  Independence with exercise equipment Exercise tolerated well No report of cardiac concerns or symptoms Strength training completed today  Goals Unmet:  Not Applicable  Comments: Pt able to follow exercise prescription today without complaint.  Will continue to monitor for progression.    Dr. Emily Filbert is Medical Director for [place] and [facility].

## 2017-08-22 ENCOUNTER — Telehealth: Payer: Self-pay

## 2017-08-22 ENCOUNTER — Encounter: Payer: PPO | Admitting: *Deleted

## 2017-08-22 DIAGNOSIS — Z955 Presence of coronary angioplasty implant and graft: Secondary | ICD-10-CM

## 2017-08-22 DIAGNOSIS — I213 ST elevation (STEMI) myocardial infarction of unspecified site: Secondary | ICD-10-CM

## 2017-08-22 NOTE — Telephone Encounter (Signed)
Boyds wife has been sick and he had to look after her - hopes to return to class today

## 2017-08-22 NOTE — Progress Notes (Signed)
Daily Session Note  Patient Details  Name: Troy Chavez MRN: [id number] Date of Birth: 06/17/1945 Referring Provider:     Cardiac Rehab from 06/18/2017 in [facility] Cardiac and Pulmonary Rehab  Referring Provider  Kathlyn Sacramento MD      Encounter Date: 08/22/2017  Check In: Session Check In - 08/22/17 1641      Check-In   Location  [facility]-Cardiac & Pulmonary Rehab    Staff Present  Renita Papa, RN Vickki Hearing, BA, ACSM CEP, Exercise Physiologist;Carroll Enterkin, RN, BSN    Supervising physician immediately available to respond to emergencies  See telemetry face sheet for immediately available ER MD    Medication changes reported      No    Fall or balance concerns reported     No    Warm-up and Cool-down  Performed on first and last piece of equipment    Resistance Training Performed  Yes    VAD Patient?  No      Pain Assessment   Currently in Pain?  No/denies          Social History   Tobacco Use  Smoking Status Former Smoker  Smokeless Tobacco Never Used  Tobacco Comment   Quit 35 yeaers ago    Goals Met:  Independence with exercise equipment Exercise tolerated well No report of cardiac concerns or symptoms Strength training completed today  Goals Unmet:  Not Applicable  Comments:Pt able to follow exercise prescription today without complaint.  Will continue to monitor for progression.      Dr. Emily Filbert is Medical Director for [place] and [facility].

## 2017-08-23 ENCOUNTER — Encounter: Payer: PPO | Admitting: *Deleted

## 2017-08-23 DIAGNOSIS — I213 ST elevation (STEMI) myocardial infarction of unspecified site: Secondary | ICD-10-CM

## 2017-08-23 NOTE — Progress Notes (Signed)
Daily Session Note  Patient Details  Name: Troy Chavez MRN: [id number] Date of Birth: 09-Feb-1946 Referring Provider:     Cardiac Rehab from 06/18/2017 in [facility] Cardiac and Pulmonary Rehab  Referring Provider  Kathlyn Sacramento MD      Encounter Date: 08/23/2017  Check In: Session Check In - 08/23/17 1656      Check-In   Location  [facility]-Cardiac & Pulmonary Rehab    Staff Present  Earlean Shawl, BS, ACSM CEP, Exercise Physiologist;Meredith Sherryll Burger, RN BSN;Laureen Janell Quiet, RRT, Respiratory Therapist    Supervising physician immediately available to respond to emergencies  See telemetry face sheet for immediately available ER MD    Medication changes reported      No    Fall or balance concerns reported     No    Warm-up and Cool-down  Performed on first and last piece of equipment    Resistance Training Performed  Yes    VAD Patient?  No      Pain Assessment   Currently in Pain?  No/denies    Multiple Pain Sites  No          Social History   Tobacco Use  Smoking Status Former Smoker  Smokeless Tobacco Never Used  Tobacco Comment   Quit 35 yeaers ago    Goals Met:  Independence with exercise equipment Exercise tolerated well No report of cardiac concerns or symptoms Strength training completed today  Goals Unmet:  Not Applicable  Comments: Pt able to follow exercise prescription today without complaint.  Will continue to monitor for progression.    Dr. Emily Filbert is Medical Director for [place] and [facility].

## 2017-08-27 ENCOUNTER — Encounter: Payer: Self-pay | Admitting: *Deleted

## 2017-08-27 ENCOUNTER — Encounter: Payer: PPO | Admitting: *Deleted

## 2017-08-27 DIAGNOSIS — I213 ST elevation (STEMI) myocardial infarction of unspecified site: Secondary | ICD-10-CM | POA: Diagnosis not present

## 2017-08-27 DIAGNOSIS — Z955 Presence of coronary angioplasty implant and graft: Secondary | ICD-10-CM

## 2017-08-27 NOTE — Progress Notes (Signed)
Daily Session Note  Patient Details  Name: Troy Chavez MRN: [id number] Date of Birth: 10/31/1945 Referring Provider:     Cardiac Rehab from 06/18/2017 in [facility] Cardiac and Pulmonary Rehab  Referring Provider  Arida, Muhammad MD      Encounter Date: 08/27/2017  Check In: Session Check In - 08/27/17 1718      Check-In   Location  [facility]-Cardiac & Pulmonary Rehab    Staff Present  Kelly Hayes, BS, ACSM CEP, Exercise Physiologist;Meredith Craven, RN BSN; , RN, BSN;Other    Supervising physician immediately available to respond to emergencies  See telemetry face sheet for immediately available ER MD    Medication changes reported      No    Fall or balance concerns reported     No    Tobacco Cessation  No Change    Warm-up and Cool-down  Performed on first and last piece of equipment    Resistance Training Performed  Yes    VAD Patient?  No      Pain Assessment   Currently in Pain?  No/denies          Social History   Tobacco Use  Smoking Status Former Smoker  Smokeless Tobacco Never Used  Tobacco Comment   Quit 35 yeaers ago    Goals Met:  Proper associated with RPD/PD & O2 Sat Exercise tolerated well No report of cardiac concerns or symptoms Strength training completed today  Goals Unmet:  Not Applicable  Comments:     Dr. Mark Miller is Medical Director for [facility] and [facility]. 

## 2017-08-29 ENCOUNTER — Encounter: Payer: Self-pay | Admitting: *Deleted

## 2017-08-29 DIAGNOSIS — I213 ST elevation (STEMI) myocardial infarction of unspecified site: Secondary | ICD-10-CM

## 2017-08-29 DIAGNOSIS — Z955 Presence of coronary angioplasty implant and graft: Secondary | ICD-10-CM

## 2017-08-29 NOTE — Progress Notes (Signed)
Daily Session Note  Patient Details  Name: Troy Chavez MRN: [id number] Date of Birth: [birth year] Referring Provider:     Cardiac Rehab from 06/18/2017 in [facility] Cardiac and Pulmonary Rehab  Referring Provider  Kathlyn Sacramento MD      Encounter Date: 08/29/2017  Check In: Session Check In - 08/29/17 1709      Check-In   Location  [facility]-Cardiac & Pulmonary Rehab    Staff Present  Renita Papa, RN Vickki Hearing, BA, ACSM CEP, Exercise Physiologist;Carroll Enterkin, RN, BSN    Supervising physician immediately available to respond to emergencies  See telemetry face sheet for immediately available ER MD    Medication changes reported      No    Fall or balance concerns reported     No    Tobacco Cessation  No Change    Warm-up and Cool-down  Performed on first and last piece of equipment    Resistance Training Performed  Yes    VAD Patient?  No      Pain Assessment   Currently in Pain?  No/denies          Social History   Tobacco Use  Smoking Status Former Smoker  Smokeless Tobacco Never Used  Tobacco Comment   Quit 35 yeaers ago    Goals Met:  Independence with exercise equipment Exercise tolerated well No report of cardiac concerns or symptoms Strength training completed today  Goals Unmet:  Not Applicable  Comments: Pt able to follow exercise prescription today without complaint.  Will continue to monitor for progression.    Dr. Emily Filbert is Medical Director for [place] and [facility].

## 2017-08-29 NOTE — Progress Notes (Signed)
Cardiac Individual Treatment Plan  Patient Details  Name: Troy Chavez MRN: [id number] Date of Birth: [birth year] Referring Provider:     Cardiac Rehab from 06/18/2017 in [facility] Cardiac and Pulmonary Rehab  Referring Provider  Kathlyn Sacramento MD      Initial Encounter Date:    Cardiac Rehab from 06/18/2017 in [facility] Cardiac and Pulmonary Rehab  Date  06/18/17  Referring Provider  Kathlyn Sacramento MD      Visit Diagnosis: ST elevation myocardial infarction (STEMI), unspecified artery [facility])  Status post coronary artery stent placement  Patient's Home Medications on Admission:  Current Outpatient Medications:  .  amLODipine (NORVASC) 5 MG tablet, Take 5 mg by mouth 2 (two) times daily., Disp: , Rfl: 11 .  aspirin 81 MG chewable tablet, Chew 1 tablet (81 mg total) by mouth daily., Disp: 30 tablet, Rfl: 0 .  atorvastatin (LIPITOR) 80 MG tablet, Take 1 tablet (80 mg total) by mouth daily at 6 PM., Disp: 30 tablet, Rfl: 0 .  carvedilol (COREG) 3.125 MG tablet, Take 1 tablet (3.125 mg total) by mouth 2 (two) times daily with a meal., Disp: 60 tablet, Rfl: 0 .  galantamine (RAZADYNE) 8 MG tablet, Take 8 mg by mouth 2 (two) times daily., Disp: , Rfl: 11 .  pantoprazole (PROTONIX) 40 MG tablet, Take 1 tablet (40 mg total) by mouth daily., Disp: 60 tablet, Rfl: 0 .  tamsulosin (FLOMAX) 0.4 MG CAPS capsule, Take 0.4 mg by mouth daily., Disp: , Rfl:  .  ticagrelor (BRILINTA) 90 MG TABS tablet, Take 1 tablet (90 mg total) by mouth 2 (two) times daily., Disp: 180 tablet, Rfl: 0  Past Medical History: Past Medical History:  Diagnosis Date  . Alzheimer's disease   . CKD (chronic kidney disease), stage IV ([place])   . Hypertension     Tobacco Use: Social History   Tobacco Use  Smoking Status Former Smoker  Smokeless Tobacco Never Used  Tobacco Comment   Quit 35 yeaers ago    Labs: Recent Merchant navy officer for [employer] Cardiac and Pulmonary Rehab Latest Ref Rng & Units 05/29/2017    Cholestrol 0 - 200 mg/dL 214(H)   LDLCALC 0 - 99 mg/dL 146(H)   HDL >40 mg/dL 52   Trlycerides <150 mg/dL 82       Exercise Target Goals:    Exercise Program Goal: Individual exercise prescription set using results from initial 6 min walk test and THRR while considering  patient's activity barriers and safety.   Exercise Prescription Goal: Initial exercise prescription builds to 30-45 minutes a day of aerobic activity, 2-3 days per week.  Home exercise guidelines will be given to patient during program as part of exercise prescription that the participant will acknowledge.  Activity Barriers & Risk Stratification: Activity Barriers & Cardiac Risk Stratification - 06/18/17 1322      Activity Barriers & Cardiac Risk Stratification   Activity Barriers  Arthritis;Back Problems;Deconditioning;Muscular Weakness Weather changes can affect arthritis    Cardiac Risk Stratification  High       6 Minute Walk: 6 Minute Walk    Row Name 06/18/17 1425         6 Minute Walk   Phase  Initial     Distance  1590 feet     Walk Time  6 minutes     # of Rest Breaks  0     MPH  3.01     METS  3.79  RPE  11     VO2 Peak  11.8     Symptoms  No     Resting HR  61 bpm     Resting BP  134/62     Resting Oxygen Saturation   100 %     Exercise Oxygen Saturation  during 6 min walk  100 %     Max Ex. HR  100 bpm     Max Ex. BP  138/64     2 Minute Post BP  124/60        Oxygen Initial Assessment:   Oxygen Re-Evaluation:   Oxygen Discharge (Final Oxygen Re-Evaluation):   Initial Exercise Prescription: Initial Exercise Prescription - 06/18/17 1400      Date of Initial Exercise RX and Referring Provider   Date  06/18/17    Referring Provider  Kathlyn Sacramento MD      Treadmill   MPH  3    Grade  0.5    Minutes  15    METs  3.5      Recumbant Bike   Level  3    RPM  40    Watts  36    Minutes  15    METs  3.5      REL-XR   Level  2    Speed  50    Minutes  15     METs  3.5      Prescription Details   Frequency (times per week)  3    Duration  Progress to 45 minutes of aerobic exercise without signs/symptoms of physical distress      Intensity   THRR 40-80% of Max Heartrate  95-130    Ratings of Perceived Exertion  11-13    Perceived Dyspnea  0-4      Progression   Progression  Continue to progress workloads to maintain intensity without signs/symptoms of physical distress.      Resistance Training   Training Prescription  Yes    Weight  4 lbs    Reps  10-15       Perform Capillary Blood Glucose checks as needed.  Exercise Prescription Changes: Exercise Prescription Changes    Row Name 06/18/17 1300 07/11/17 1200 07/25/17 1200         Response to Exercise   Blood Pressure (Admit)  134/62  104/56  118/78     Blood Pressure (Exercise)  138/64  148/72  144/74     Blood Pressure (Exit)  124/60  -  124/70     Heart Rate (Admit)  61 bpm  70 bpm  80 bpm     Heart Rate (Exercise)  100 bpm  112 bpm  110 bpm     Heart Rate (Exit)  59 bpm  49 bpm  65 bpm     Oxygen Saturation (Admit)  100 %  -  -     Oxygen Saturation (Exercise)  100 %  -  -     Rating of Perceived Exertion (Exercise)  '11  13  13     ' Symptoms  none  none  none     Comments  walk test results  -  -     Duration  -  -  Continue with 45 min of aerobic exercise without signs/symptoms of physical distress.     Intensity  -  -  THRR unchanged       Progression   Progression  -  Continue to progress workloads  to maintain intensity without signs/symptoms of physical distress.  Continue to progress workloads to maintain intensity without signs/symptoms of physical distress.     Average METs  -  3.7  3.77       Resistance Training   Training Prescription  -  Yes  Yes     Weight  -  4 lb  4 lb     Reps  -  10-15  10-15       Interval Training   Interval Training  -  No  No       Treadmill   MPH  -  3  -     Grade  -  0.5  -     Minutes  -  15  -     METs  -  3.7  -        REL-XR   Level  -  2  4     Speed  -  50  50     Minutes  -  15  15     METs  -  -  3.77        Exercise Comments: Exercise Comments    Row Name 06/27/17 1629 07/18/17 1731         Exercise Comments  First full day of exercise!  Patient was oriented to gym and equipment including functions, settings, policies, and procedures.  Patient's individual exercise prescription and treatment plan were reviewed.  All starting workloads were established based on the results of the 6 minute walk test done at initial orientation visit.  The plan for exercise progression was also introduced and progression will be customized based on patient's performance and goals  Reviewed home exercise with pt today.  Pt plans to walk for exercise.  Reviewed THR, pulse, RPE, sign and symptoms, NTG use, and when to call 911 or MD.  Also discussed weather considerations and indoor options.  Pt voiced understanding.         Exercise Goals and Review: Exercise Goals    Row Name 06/18/17 1428             Exercise Goals   Increase Physical Activity  Yes       Intervention  Provide advice, education, support and counseling about physical activity/exercise needs.;Develop an individualized exercise prescription for aerobic and resistive training based on initial evaluation findings, risk stratification, comorbidities and participant's personal goals.       Expected Outcomes  Short Term: Attend rehab on a regular basis to increase amount of physical activity.;Long Term: Add in home exercise to make exercise part of routine and to increase amount of physical activity.;Long Term: Exercising regularly at least 3-5 days a week.       Increase Strength and Stamina  Yes       Intervention  Provide advice, education, support and counseling about physical activity/exercise needs.;Develop an individualized exercise prescription for aerobic and resistive training based on initial evaluation findings, risk stratification,  comorbidities and participant's personal goals.       Expected Outcomes  Short Term: Increase workloads from initial exercise prescription for resistance, speed, and METs.;Short Term: Perform resistance training exercises routinely during rehab and add in resistance training at home;Long Term: Improve cardiorespiratory fitness, muscular endurance and strength as measured by increased METs and functional capacity (6MWT)       Able to understand and use rate of perceived exertion (RPE) scale  Yes       Intervention  Provide education and explanation on how to use RPE scale       Expected Outcomes  Short Term: Able to use RPE daily in rehab to express subjective intensity level;Long Term:  Able to use RPE to guide intensity level when exercising independently       Knowledge and understanding of Target Heart Rate Range (THRR)  Yes       Intervention  Provide education and explanation of THRR including how the numbers were predicted and where they are located for reference       Expected Outcomes  Short Term: Able to state/look up THRR;Long Term: Able to use THRR to govern intensity when exercising independently;Short Term: Able to use daily as guideline for intensity in rehab       Able to check pulse independently  Yes       Intervention  Provide education and demonstration on how to check pulse in carotid and radial arteries.;Review the importance of being able to check your own pulse for safety during independent exercise       Expected Outcomes  Short Term: Able to explain why pulse checking is important during independent exercise;Long Term: Able to check pulse independently and accurately       Understanding of Exercise Prescription  Yes       Intervention  Provide education, explanation, and written materials on patient's individual exercise prescription       Expected Outcomes  Short Term: Able to explain program exercise prescription;Long Term: Able to explain home exercise prescription to  exercise independently          Exercise Goals Re-Evaluation : Exercise Goals Re-Evaluation    Row Name 06/27/17 1629 07/11/17 1246 07/18/17 1731 07/25/17 1240       Exercise Goal Re-Evaluation   Exercise Goals Review  Able to understand and use rate of perceived exertion (RPE) scale;Knowledge and understanding of Target Heart Rate Range (THRR);Understanding of Exercise Prescription  Increase Physical Activity;Able to understand and use rate of perceived exertion (RPE) scale;Knowledge and understanding of Target Heart Rate Range (THRR);Increase Strength and Stamina  Increase Physical Activity;Increase Strength and Stamina;Able to understand and use rate of perceived exertion (RPE) scale;Knowledge and understanding of Target Heart Rate Range (THRR);Able to check pulse independently;Understanding of Exercise Prescription  Increase Physical Activity;Able to understand and use rate of perceived exertion (RPE) scale;Knowledge and understanding of Target Heart Rate Range (THRR);Increase Strength and Stamina    Comments  Reviewed RPE scale, THR and program prescription with pt today.  Pt voiced understanding and was given a copy of goals to take home.   Pt is tolerating exercise well and reaching THR range during exercise  Reviewed home exercise with pt today.  Pt plans to walk for exercise.  Reviewed THR, pulse, RPE, sign and symptoms, NTG use, and when to call 911 or MD.  Also discussed weather considerations and indoor options.  Pt voiced understanding.  Troy Chavez is progressing well with exercise and has increased resistance on the XR. Staff wil add intervals to his program    Expected Outcomes  Short: Use RPE daily to regulate intensity.  Long: Follow program prescription in THR.  Short - Pt will attend regularly Long - Pt will increase overall MET level  Short - Pt will continue to walk on days not at [facility] and will monitor RPE and HR Long - Pt will maintain exercise independently  Short  -Troy Chavez will add interval  training Long - Troy Chavez will increase oiverall MET level  Discharge Exercise Prescription (Final Exercise Prescription Changes): Exercise Prescription Changes - 07/25/17 1200      Response to Exercise   Blood Pressure (Admit)  118/78    Blood Pressure (Exercise)  144/74    Blood Pressure (Exit)  124/70    Heart Rate (Admit)  80 bpm    Heart Rate (Exercise)  110 bpm    Heart Rate (Exit)  65 bpm    Rating of Perceived Exertion (Exercise)  13    Symptoms  none    Duration  Continue with 45 min of aerobic exercise without signs/symptoms of physical distress.    Intensity  THRR unchanged      Progression   Progression  Continue to progress workloads to maintain intensity without signs/symptoms of physical distress.    Average METs  3.77      Resistance Training   Training Prescription  Yes    Weight  4 lb    Reps  10-15      Interval Training   Interval Training  No      REL-XR   Level  4    Speed  50    Minutes  15    METs  3.77       Nutrition:  Target Goals: Understanding of nutrition guidelines, daily intake of sodium <1578m, cholesterol <2031m calories 30% from fat and 7% or less from saturated fats, daily to have 5 or more servings of fruits and vegetables.  Biometrics: Pre Biometrics - 06/18/17 1428      Pre Biometrics   Height  5' 8.9" (1.75 m)    Weight  181 lb 4.8 oz (82.2 kg)    Waist Circumference  37.5 inches    Hip Circumference  42 inches    Waist to Hip Ratio  0.89 %    BMI (Calculated)  26.85    Single Leg Stand  25.92 seconds        Nutrition Therapy Plan and Nutrition Goals: Nutrition Therapy & Goals - 08/23/17 1709      Nutrition Therapy   Diet  DASH    Protein (specify units)  8-9oz    Fiber  30 grams    Whole Grain Foods  3 servings    Saturated Fats  15 max. grams    Fruits and Vegetables  5 servings/day 8 ideal    Sodium  1500 grams      Personal Nutrition Goals   Nutrition Goal  Include more vegetables in your daily diet     Personal Goal #2  Look for dietary sources of omega-3 fatty acids as discussed. These are heart-healthy fats and may also help slow memory loss    Personal Goal #3  Continue to read nutrition facts labels more often to identify items high / low in sodium    Comments  He and his wife have started to follow a more heart-healthy eating plan. They use less salt when cooking and he has started to look at nutrition facts labels since starting this class. Likes fruits but does not eat as many vegetables gave handout on low sodium eating      Intervention Plan   Intervention  Prescribe, educate and counsel regarding individualized specific dietary modifications aiming towards targeted core components such as weight, hypertension, lipid management, diabetes, heart failure and other comorbidities.;Nutrition handout(s) given to patient.    Expected Outcomes  Short Term Goal: Understand basic principles of dietary content, such as calories, fat, sodium, cholesterol and nutrients.;Short Term Goal:  A plan has been developed with personal nutrition goals set during dietitian appointment.;Long Term Goal: Adherence to prescribed nutrition plan.       Nutrition Assessments: Nutrition Assessments - 06/18/17 1300      MEDFICTS Scores   Pre Score  43       Nutrition Goals Re-Evaluation: Nutrition Goals Re-Evaluation    Row Name 07/23/17 1406 08/23/17 1716 08/23/17 1717         Goals   Current Weight  174 lb (78.9 kg)  -  -     Nutrition Goal  [place]earth healthy eating already even when they got out to eat and Troy Chavez reports that his wife cooks healthy and is a good Training and development officer.  Look for dietary sources of omega-3 fatty acids as discussed. These are heart-healthy fats and may also help slow memory loss  Include more servings of vegetables in your daily diet     Comment  -  He is interested in learning about what foods can aid with memory, and also which types of fat are better to eat  He does not consume as many  vegetables as he does fruit     Expected Outcome  Heart healthy eating.   He will choose fat sources that are rich in omega-3s and ask his wife to switch to olive oil for cooking  He will consume at least 2 servings of vegetables daily       Personal Goal #2 Re-Evaluation   Personal Goal #2  -  -  Continue to read nutrition facts labels more regularly, identifying items high / low in sodium        Nutrition Goals Discharge (Final Nutrition Goals Re-Evaluation): Nutrition Goals Re-Evaluation - 08/23/17 1717      Goals   Nutrition Goal  Include more servings of vegetables in your daily diet    Comment  He does not consume as many vegetables as he does fruit    Expected Outcome  He will consume at least 2 servings of vegetables daily      Personal Goal #2 Re-Evaluation   Personal Goal #2  Continue to read nutrition facts labels more regularly, identifying items high / low in sodium       Psychosocial: Target Goals: Acknowledge presence or absence of significant depression and/or stress, maximize coping skills, provide positive support system. Participant is able to verbalize types and ability to use techniques and skills needed for reducing stress and depression.   Initial Review & Psychosocial Screening: Initial Psych Review & Screening - 06/18/17 1301      Initial Review   Current issues with  Current Sleep Concerns Sleep concerns are chronic. Trouble getting to sleep      [place]?  Yes Spouse Becky       Barriers   Psychosocial barriers to participate in program  There are no identifiable barriers or psychosocial needs.;The patient should benefit from training in stress management and relaxation.      Screening Interventions   Interventions  Encouraged to exercise;Provide feedback about the scores to participant    Expected Outcomes  Short Term goal: Utilizing psychosocial counselor, staff and physician to assist with identification of specific  Stressors or current issues interfering with healing process. Setting desired goal for each stressor or current issue identified.;Long Term Goal: Stressors or current issues are controlled or eliminated.;Short Term goal: Identification and review with participant of any Quality of Life or Depression concerns found by scoring the  questionnaire.;Long Term goal: The participant improves quality of Life and PHQ9 Scores as seen by post scores and/or verbalization of changes       Quality of Life Scores:  Quality of Life - 06/18/17 1303      Quality of Life Scores   Health/Function Pre  23.46 %    Socioeconomic Pre  20.93 %    Psych/Spiritual Pre  24.21 %    Family Pre  19.8 %    GLOBAL Pre  22.53 %      Scores of 19 and below usually indicate a poorer quality of life in these areas.  A difference of  2-3 points is a clinically meaningful difference.  A difference of 2-3 points in the total score of the Quality of Life Index has been associated with significant improvement in overall quality of life, self-image, physical symptoms, and general health in studies assessing change in quality of life.  PHQ-9: Recent Review Flowsheet Data    Depression screen [facility] 2/9 06/18/2017   Decreased Interest 0   Down, Depressed, Hopeless 0   PHQ - 2 Score 0   Altered sleeping 2   Tired, decreased energy 0   Change in appetite 0   Feeling bad or failure about yourself  0   Trouble concentrating 0   Moving slowly or fidgety/restless 0   Suicidal thoughts 0   PHQ-9 Score 2   Difficult doing work/chores Not difficult at all     Interpretation of Total Score  Total Score Depression Severity:  1-4 = Minimal depression, 5-9 = Mild depression, 10-14 = Moderate depression, 15-19 = Moderately severe depression, 20-27 = Severe depression   Psychosocial Evaluation and Intervention: Psychosocial Evaluation - 07/09/17 1711      Psychosocial Evaluation & Interventions   Interventions  Encouraged to exercise with  the program and follow exercise prescription    Comments  Counselor met with Mr. Ironside Troy Chavez) today for initial psychosocial evaluation.  He is a 72 year old who had a heart attack and stent inserted several weeks ago.  He has a strong support system with a spouse of 30 years; several step daughters locally as well as a son who lives in the [place] area.  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  He sleeps well and has a good appetite.  Troy Chavez denies a history of depression or anxiety or any current symptoms.  He has minimal stress in his life and states he is typically in a positive mood.  Troy Chavez has goals to increase his stamina and return to his normal daily activities.  He will be followed by staff.      Expected Outcomes  Troy Chavez will benefit from consistent exercise to achieve his stated goals.  The educational and psychoeducational components of this program will be helpful in understanding and managing his heart condition.      Continue Psychosocial Services   Follow up required by staff       Psychosocial Re-Evaluation: Psychosocial Re-Evaluation    [place] Name 07/23/17 1405             Psychosocial Re-Evaluation   Comments  Troy Chavez and his wife feel Cardiac REhab is "enjoyable for [employer] and he likes it.        Interventions  Encouraged to attend Cardiac Rehabilitation for the exercise  Psychosocial Discharge (Final Psychosocial Re-Evaluation): Psychosocial Re-Evaluation - 07/23/17 1405      Psychosocial Re-Evaluation   Comments  Troy Chavez and his wife feel Cardiac REhab is "enjoyable for [employer] and he likes it.     Interventions  Encouraged to attend Cardiac Rehabilitation for the exercise       Vocational Rehabilitation: Provide vocational rehab assistance to qualifying candidates.   Vocational Rehab Evaluation & Intervention: Vocational Rehab -  06/18/17 1311      Initial Vocational Rehab Evaluation & Intervention   Assessment shows need for Vocational Rehabilitation  No       Education: Education Goals: Education classes will be provided on a variety of topics geared toward better understanding of heart health and risk factor modification. Participant will state understanding/return demonstration of topics presented as noted by education test scores.  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   General Nutrition Guidelines/Fats and Fiber: -Group instruction provided by verbal, written material, models and posters to present the general guidelines for heart healthy nutrition. Gives an explanation and review of dietary fats and fiber.   Cardiac Rehab from 08/27/2017 in [facility] Cardiac and Pulmonary Rehab  Date  07/30/17  Educator  PI  Instruction Review Code  1- Verbalizes Understanding      Controlling Sodium/Reading Food Labels: -Group verbal and written material supporting the discussion of sodium use in heart healthy nutrition. Review and explanation with models, verbal and written materials for utilization of the food label.   Cardiac Rehab from 08/27/2017 in [facility] Cardiac and Pulmonary Rehab  Date  08/06/17  Educator  PI  Instruction Review Code  1- Verbalizes Understanding      Exercise Physiology & General Exercise Guidelines: - Group verbal and written instruction with models to review the exercise physiology of the cardiovascular system and associated critical values. Provides general exercise guidelines with specific guidelines to those with  heart or lung disease.    Aerobic Exercise & Resistance Training: - Gives group verbal and written instruction on the various components of exercise. Focuses on aerobic and resistive training programs and the benefits of this training and how to safely progress through these programs..   Cardiac Rehab from 08/27/2017 in [facility] Cardiac and Pulmonary Rehab  Date  08/27/17  Educator  Wynelle Fanny  Instruction Review Code  1- Verbalizes Understanding      Flexibility, Balance, Mind/Body Relaxation: Provides group verbal/written instruction on the benefits of flexibility and balance training, including mind/body exercise modes such as yoga, pilates and tai chi.  Demonstration and skill practice provided.   Stress and Anxiety: - Provides group verbal and written instruction about the health risks of elevated stress and causes of high stress.  Discuss the correlation between heart/lung disease and anxiety and treatment options. Review healthy ways to manage with stress and anxiety.   Cardiac Rehab from 08/27/2017 in [facility] Cardiac and Pulmonary Rehab  Date  07/11/17  Educator  [facility]  Instruction Review Code  1- Verbalizes Understanding      Depression: - Provides group verbal and written instruction on the correlation between heart/lung disease and depressed mood, treatment options, and the stigmas associated with  seeking treatment.   Cardiac Rehab from 08/27/2017 in [facility] Cardiac and Pulmonary Rehab  Date  08/22/17  Educator  [facility]  Instruction Review Code  1- Verbalizes Understanding      Anatomy & Physiology of the Heart: - Group verbal and written instruction and models provide basic cardiac anatomy and physiology, with the coronary electrical and arterial systems. Review of Valvular disease and Heart Failure   Cardiac Rehab from 08/27/2017 in [facility] Cardiac and Pulmonary Rehab  Date  07/23/17  Educator  CE  Instruction Review Code  1- Verbalizes Understanding      Cardiac Procedures: - Group  verbal and written instruction to review commonly prescribed medications for heart disease. Reviews the medication, class of the drug, and side effects. Includes the steps to properly store meds and maintain the prescription regimen. (beta blockers and nitrates)   Cardiac Rehab from 08/27/2017 in [facility] Cardiac and Pulmonary Rehab  Date  08/01/17  Educator  [facility]  Instruction Review Code  1- Verbalizes Understanding      Cardiac Medications I: - Group verbal and written instruction to review commonly prescribed medications for heart disease. Reviews the medication, class of the drug, and side effects. Includes the steps to properly store meds and maintain the prescription regimen.   Cardiac Medications II: -Group verbal and written instruction to review commonly prescribed medications for heart disease. Reviews the medication, class of the drug, and side effects. (all other drug classes)   Cardiac Rehab from 08/27/2017 in [facility] Cardiac and Pulmonary Rehab  Date  07/09/17  Educator  CE  Instruction Review Code  1- Verbalizes Understanding       Go Sex-Intimacy & Heart Disease, Get SMART - Goal Setting: - Group verbal and written instruction through game format to discuss heart disease and the return to sexual intimacy. Provides group verbal and written material to discuss and apply goal setting through the application of the S.M.A.R.T. Method.   Cardiac Rehab from 08/27/2017 in [facility] Cardiac and Pulmonary Rehab  Date  08/01/17  Educator  [facility]  Instruction Review Code  1- Verbalizes Understanding      Other Matters of the Heart: - Provides group verbal, written materials and models to describe Stable Angina and Peripheral Artery. Includes description of the disease process and treatment options available to the cardiac patient.   Exercise & Equipment Safety: - Individual verbal instruction and demonstration of equipment use and safety with use of the equipment.   Cardiac Rehab from 08/27/2017  in [facility] Cardiac and Pulmonary Rehab  Date  06/18/17  Educator  [facility]  Instruction Review Code  1- Verbalizes Understanding      Infection Prevention: - Provides verbal and written material to individual with discussion of infection control including proper hand washing and proper equipment cleaning during exercise session.   Cardiac Rehab from 08/27/2017 in [facility] Cardiac and Pulmonary Rehab  Date  06/18/17  Educator  [facility]  Instruction Review Code  1- Verbalizes Understanding      Falls Prevention: - Provides verbal and written material to individual with discussion of falls prevention and safety.   Cardiac Rehab from 08/27/2017 in [facility] Cardiac and Pulmonary Rehab  Date  06/18/17  Educator  [facility]  Instruction Review Code  1- Verbalizes Understanding      Diabetes: - Individual verbal and written instruction to review signs/symptoms of diabetes, desired ranges of glucose level fasting, after meals and with exercise. Acknowledge that pre and post exercise glucose checks will be done for 3 sessions  at entry of program.   Know Your Numbers and Risk Factors: -Group verbal and written instruction about important numbers in your health.  Discussion of what are risk factors and how they play a role in the disease process.  Review of Cholesterol, Blood Pressure, Diabetes, and BMI and the role they play in your overall health.   Cardiac Rehab from 08/27/2017 in [facility] Cardiac and Pulmonary Rehab  Date  07/09/17  Educator  CE  Instruction Review Code  1- Verbalizes Understanding      Sleep Hygiene: -Provides group verbal and written instruction about how sleep can affect your health.  Define sleep hygiene, discuss sleep cycles and impact of sleep habits. Review good sleep hygiene tips.    Other: -Provides group and verbal instruction on various topics (see comments)   Knowledge Questionnaire Score: Knowledge Questionnaire Score - 06/18/17 1311      Knowledge Questionnaire Score   Pre Score   19/28 reviewed correct responses with Troy Chavez today. He verbalized understanding and had no further questions       Core Components/Risk Factors/Patient Goals at Admission: Personal Goals and Risk Factors at Admission - 06/18/17 1301      Core Components/Risk Factors/Patient Goals on Admission    Weight Management  Yes;Weight Loss    Intervention  Weight Management: Develop a combined nutrition and exercise program designed to reach desired caloric intake, while maintaining appropriate intake of nutrient and fiber, sodium and fats, and appropriate energy expenditure required for the weight goal.;Weight Management: Provide education and appropriate resources to help participant work on and attain dietary goals.    Admit Weight  181 lb 4.8 oz (82.2 kg)    Goal Weight: Short Term  176 lb (79.8 kg)    Goal Weight: Long Term  171 lb (77.6 kg)    Expected Outcomes  Short Term: Continue to assess and modify interventions until short term weight is achieved;Long Term: Adherence to nutrition and physical activity/exercise program aimed toward attainment of established weight goal;Weight Loss: Understanding of general recommendations for a balanced deficit meal plan, which promotes 1-2 lb weight loss per week and includes a negative energy balance of [phone] kcal/d;Understanding recommendations for meals to include 15-35% energy as protein, 25-35% energy from fat, 35-60% energy from carbohydrates, less than 211m of dietary cholesterol, 20-35 gm of total fiber daily;Understanding of distribution of calorie intake throughout the day with the consumption of 4-5 meals/snacks    Hypertension  Yes    Intervention  Provide education on lifestyle modifcations including regular physical activity/exercise, weight management, moderate sodium restriction and increased consumption of fresh fruit, vegetables, and low fat dairy, alcohol moderation, and smoking cessation.;Monitor prescription use compliance.    Expected  Outcomes  Short Term: Continued assessment and intervention until BP is < 140/931mHG in hypertensive participants. < 130/8057mG in hypertensive participants with diabetes, heart failure or chronic kidney disease.;Long Term: Maintenance of blood pressure at goal levels.    Lipids  Yes    Intervention  Provide education and support for participant on nutrition & aerobic/resistive exercise along with prescribed medications to achieve LDL <47m86mDL >40mg91m Expected Outcomes  Short Term: Participant states understanding of desired cholesterol values and is compliant with medications prescribed. Participant is following exercise prescription and nutrition guidelines.;Long Term: Cholesterol controlled with medications as prescribed, with individualized exercise RX and with personalized nutrition plan. Value goals: LDL < 47mg,9m > 40 mg.       Core Components/Risk Factors/Patient Goals Review:  Goals and Risk Factor Review    Row Name 07/23/17 1401 08/22/17 1732           Core Components/Risk Factors/Patient Goals Review   Personal Goals Review  Weight Management/Obesity;Lipids;Hypertension  Weight Management/Obesity;Lipids;Hypertension      Review  weight is currently down to 174lbs from 186lbs. Anh "Troy Chavez" feels things are going "good. Troy Chavez said he already eats healthy alreaady even if they go out they eat salmon or a salad. Troy Chavez was sick with a virus on THursday and got Augmentin from his doctor. Did not have a fever and is feeling better today. HIs blood pressure has been good. MD follows his lipid blood work which he feels it will be good again since he eats good.   Pt is taking all meds as directed.  Pt is maintaining weight.  BP has been in normal range during HT class.  He is walking 2 miles outside or at the mall on days not at [facility].      Expected Outcomes  Heart healthy weight and blood pressure.  Short - attend class regularly Long - continue to maintain weight and take meds as directed          Core Components/Risk Factors/Patient Goals at Discharge (Final Review):  Goals and Risk Factor Review - 08/22/17 1732      Core Components/Risk Factors/Patient Goals Review   Personal Goals Review  Weight Management/Obesity;Lipids;Hypertension    Review  Pt is taking all meds as directed.  Pt is maintaining weight.  BP has been in normal range during HT class.  He is walking 2 miles outside or at the mall on days not at [facility].    Expected Outcomes  Short - attend class regularly Long - continue to maintain weight and take meds as directed       ITP Comments: ITP Comments    Row Name 06/18/17 1257 07/04/17 0601 08/01/17 0649 08/29/17 0626     ITP Comments  Medical review completed. ITP sent to Dr Frederik Pear toreview, make changes as needed and sign.   Documentation of diagnosis can be found [facility] 05/28/2017 encounter  30 day review. Continue with ITP unless directed changes per Medical Director review.  New to program  2 visits   30 Day review. Continue with ITP unless directed changes per Medical Director review.    30 day review. Continue with ITP unless directed changes per Medical Director       Comments:

## 2017-08-30 VITALS — Ht 68.9 in | Wt 185.0 lb

## 2017-08-30 DIAGNOSIS — I213 ST elevation (STEMI) myocardial infarction of unspecified site: Secondary | ICD-10-CM | POA: Diagnosis not present

## 2017-08-30 NOTE — Progress Notes (Signed)
Daily Session Note  Patient Details  Name: Troy Chavez MRN: [id number] Date of Birth: 1945/10/18 Referring Provider:     Cardiac Rehab from 06/18/2017 in [facility] Cardiac and Pulmonary Rehab  Referring Provider  Kathlyn Sacramento MD      Encounter Date: 08/30/2017  Check In: Session Check In - 08/30/17 1619      Check-In   Staff Present  Justin Mend Jaci Carrel, BS, ACSM CEP, Exercise Physiologist;Meredith Sherryll Burger, RN BSN    Supervising physician immediately available to respond to emergencies  See telemetry face sheet for immediately available ER MD    Medication changes reported      No    Fall or balance concerns reported     No    Warm-up and Cool-down  Performed on first and last piece of equipment    Resistance Training Performed  Yes    VAD Patient?  No      Pain Assessment   Currently in Pain?  No/denies    Multiple Pain Sites  No          Social History   Tobacco Use  Smoking Status Former Smoker  Smokeless Tobacco Never Used  Tobacco Comment   Quit 35 yeaers ago    Goals Met:  Independence with exercise equipment Exercise tolerated well Queuing for purse lip breathing No report of cardiac concerns or symptoms  Goals Unmet:  Not Applicable  Comments:  6 Minute Walk    Row Name 06/18/17 1425 08/30/17 1618       6 Minute Walk   Phase  Initial  Discharge    Distance  1590 feet  1925 feet    Distance % Change  -  21 %    Distance Feet Change  -  335 ft    Walk Time  6 minutes  6 minutes    # of Rest Breaks  0  0    MPH  3.01  3.65    METS  3.79  4.19    RPE  11  13    Perceived Dyspnea   -  1    VO2 Peak  11.8  14.66    Symptoms  No  No    Resting HR  61 bpm  69 bpm    Resting BP  134/62  106/64    Resting Oxygen Saturation   100 %  99 %    Exercise Oxygen Saturation  during 6 min walk  100 %  -    Max Ex. HR  100 bpm  120 bpm    Max Ex. BP  138/64  150/70    2 Minute Post BP  124/60  -         Dr. Emily Filbert is Medical  Director for [place] [facility].

## 2017-09-03 ENCOUNTER — Encounter: Payer: PPO | Admitting: *Deleted

## 2017-09-03 DIAGNOSIS — Z955 Presence of coronary angioplasty implant and graft: Secondary | ICD-10-CM

## 2017-09-03 DIAGNOSIS — I213 ST elevation (STEMI) myocardial infarction of unspecified site: Secondary | ICD-10-CM

## 2017-09-03 NOTE — Progress Notes (Signed)
Daily Session Note  Patient Details  Name: Troy Chavez MRN: [id number] Date of Birth: May 20, 1945 Referring Provider:     Cardiac Rehab from 06/18/2017 in [facility] Cardiac and Pulmonary Rehab  Referring Provider  Kathlyn Sacramento MD      Encounter Date: 09/03/2017  Check In: Session Check In - 09/03/17 1710      Check-In   Location  [facility]-Cardiac & Pulmonary Rehab    Staff Present  Renita Papa, RN Moises Blood, BS, ACSM CEP, Exercise Physiologist;Susanne Bice, RN, BSN, CCRP;Carroll Enterkin, RN, BSN    Supervising physician immediately available to respond to emergencies  See telemetry face sheet for immediately available ER MD    Medication changes reported      No    Fall or balance concerns reported     No    Warm-up and Cool-down  Performed as group-led instruction    Resistance Training Performed  Yes    VAD Patient?  No      Pain Assessment   Currently in Pain?  No/denies          Social History   Tobacco Use  Smoking Status Former Smoker  Smokeless Tobacco Never Used  Tobacco Comment   Quit 35 yeaers ago    Goals Met:  Independence with exercise equipment Exercise tolerated well No report of cardiac concerns or symptoms Strength training completed today  Goals Unmet:  Not Applicable  Comments: Pt able to follow exercise prescription today without complaint.  Will continue to monitor for progression.    Dr. Emily Filbert is Medical Director for [place] and [facility].

## 2017-09-05 ENCOUNTER — Encounter: Payer: PPO | Attending: Cardiovascular Disease | Admitting: *Deleted

## 2017-09-05 DIAGNOSIS — Z955 Presence of coronary angioplasty implant and graft: Secondary | ICD-10-CM | POA: Insufficient documentation

## 2017-09-05 DIAGNOSIS — I213 ST elevation (STEMI) myocardial infarction of unspecified site: Secondary | ICD-10-CM | POA: Diagnosis not present

## 2017-09-05 NOTE — Progress Notes (Signed)
Daily Session Note  Patient Details  Name: Troy Chavez MRN: [id number] Date of Birth: December 19, 1945 Referring Provider:     Cardiac Rehab from 06/18/2017 in [facility] Cardiac and Pulmonary Rehab  Referring Provider  Kathlyn Sacramento MD      Encounter Date: 09/05/2017  Check In: Session Check In - 09/05/17 1621      Check-In   Location  [facility]-Cardiac & Pulmonary Rehab    Staff Present  Renita Papa, RN Vickki Hearing, BA, ACSM CEP, Exercise Physiologist;Carroll Enterkin, RN, BSN    Supervising physician immediately available to respond to emergencies  See telemetry face sheet for immediately available ER MD    Medication changes reported      No    Fall or balance concerns reported     No    Tobacco Cessation  No Change    Warm-up and Cool-down  Performed on first and last piece of equipment    Resistance Training Performed  Yes    VAD Patient?  No      Pain Assessment   Currently in Pain?  No/denies        Exercise Prescription Changes - 09/05/17 1500      Response to Exercise   Blood Pressure (Admit)  146/70    Blood Pressure (Exit)  110/62    Heart Rate (Admit)  98 bpm    Heart Rate (Exercise)  113 bpm    Heart Rate (Exit)  78 bpm    Rating of Perceived Exertion (Exercise)  11    Symptoms  none    Duration  Continue with 45 min of aerobic exercise without signs/symptoms of physical distress.    Intensity  THRR unchanged      Progression   Progression  Continue to progress workloads to maintain intensity without signs/symptoms of physical distress.    Average METs  3.7      Resistance Training   Training Prescription  Yes    Weight  4 lb    Reps  10-15      Interval Training   Interval Training  No      Treadmill   MPH  3    Grade  0.5    Minutes  15    METs  3.7      REL-XR   Level  3    Speed  50    Minutes  15       Social History   Tobacco Use  Smoking Status Former Smoker  Smokeless Tobacco Never Used  Tobacco Comment   Quit 35 yeaers  ago    Goals Met:  Independence with exercise equipment Exercise tolerated well No report of cardiac concerns or symptoms Strength training completed today  Goals Unmet:  Not Applicable  Comments: Pt able to follow exercise prescription today without complaint.  Will continue to monitor for progression.    Dr. Emily Filbert is Medical Director for [place] and [facility].

## 2017-09-06 DIAGNOSIS — I213 ST elevation (STEMI) myocardial infarction of unspecified site: Secondary | ICD-10-CM

## 2017-09-06 NOTE — Progress Notes (Signed)
Daily Session Note  Patient Details  Name: Troy Chavez MRN: [id number] Date of Birth: [birth year] Referring Provider:     Cardiac Rehab from 06/18/2017 in [facility] Cardiac and Pulmonary Rehab  Referring Provider  Troy Sacramento MD      Encounter Date: 09/06/2017  Check In: Session Check In - 09/06/17 1617      Check-In   Location  [facility]-Cardiac & Pulmonary Rehab    Staff Present  Troy Chavez Troy Chavez, BS, ACSM CEP, Exercise Physiologist;Troy Sherryll Burger, RN BSN    Supervising physician immediately available to respond to emergencies  See telemetry face sheet for immediately available ER MD    Medication changes reported      No    Fall or balance concerns reported     No    Tobacco Cessation  No Change    Warm-up and Cool-down  Performed on first and last piece of equipment    Resistance Training Performed  Yes    VAD Patient?  No      Pain Assessment   Currently in Pain?  No/denies          Social History   Tobacco Use  Smoking Status Former Smoker  Smokeless Tobacco Never Used  Tobacco Comment   Quit 35 yeaers ago    Goals Met:  Independence with exercise equipment Exercise tolerated well No report of cardiac concerns or symptoms Strength training completed today  Goals Unmet:  Not Applicable  Comments:  Troy Chavez graduated today from  rehab with 36 sessions completed.  Details of the patient's exercise prescription and what He needs to do in order to continue the prescription and progress were discussed with patient.  Patient was given a copy of prescription and goals.  Patient verbalized understanding.  Nuchem plans to continue to exercise by staying active at home.   Dr. Emily Chavez is Medical Director for [name].

## 2017-09-06 NOTE — Progress Notes (Signed)
Discharge Progress Report  Patient Details  Name: Troy Chavez MRN: [id number] Date of Birth: 08-12-1945 Referring Provider:     Cardiac Rehab from 06/18/2017 in [facility]  Referring Provider  Kathlyn Sacramento MD       Number of Visits: 36/36  Reason for Discharge:  Patient reached a stable level of exercise. Patient independent in their exercise. Patient has met program and personal goals.  Smoking History:  Social History   Tobacco Use  Smoking Status Former Smoker  Smokeless Tobacco Never Used  Tobacco Comment   Quit 41 yeaers ago    Diagnosis:  ST elevation myocardial infarction (STEMI), unspecified artery ([place])  ADL [facility]:   Initial Exercise Prescription: Initial Exercise Prescription - 06/18/17 1400      Date of Initial Exercise RX and Referring Provider   Date  06/18/17    Referring Provider  Kathlyn Sacramento MD      Treadmill   MPH  3    Grade  0.5    Minutes  15    METs  3.5      Recumbant Bike   Level  3    RPM  40    Watts  36    Minutes  15    METs  3.5      REL-XR   Level  2    Speed  50    Minutes  15    METs  3.5      Prescription Details   Frequency (times per week)  3    Duration  Progress to 45 minutes of aerobic exercise without signs/symptoms of physical distress      Intensity   THRR 40-80% of Max Heartrate  95-130    Ratings of Perceived Exertion  11-13    Perceived Dyspnea  0-4      Progression   Progression  Continue to progress workloads to maintain intensity without signs/symptoms of physical distress.      Resistance Training   Training Prescription  Yes    Weight  4 lbs    Reps  10-15       Discharge Exercise Prescription (Final Exercise Prescription Changes): Exercise Prescription Changes - 09/05/17 1500      Response to Exercise   Blood Pressure (Admit)  146/70    Blood Pressure (Exit)  110/62    Heart Rate (Admit)  98 bpm    Heart Rate (Exercise)  113 bpm    Heart Rate (Exit)   78 bpm    Rating of Perceived Exertion (Exercise)  11    Symptoms  none    Duration  Continue with 45 min of aerobic exercise without signs/symptoms of physical distress.    Intensity  THRR unchanged      Progression   Progression  Continue to progress workloads to maintain intensity without signs/symptoms of physical distress.    Average METs  3.7      Resistance Training   Training Prescription  Yes    Weight  4 lb    Reps  10-15      Interval Training   Interval Training  No      Treadmill   MPH  3    Grade  0.5    Minutes  15    METs  3.7      REL-XR   Level  3    Speed  50    Minutes  15       Functional Capacity:  [place] Name 06/18/17 1425 08/30/17 1618       6 Minute Walk   Phase  Initial  Discharge    Distance  1590 feet  1925 feet    Distance % Change  -  21 %    Distance Feet Change  -  335 ft    Walk Time  6 minutes  6 minutes    # of Rest Breaks  0  0    MPH  3.01  3.65    METS  3.79  4.19    RPE  11  13    Perceived Dyspnea   -  1    VO2 Peak  11.8  14.66    Symptoms  No  No    Resting HR  61 bpm  69 bpm    Resting BP  134/62  106/64    Resting Oxygen Saturation   100 %  99 %    Exercise Oxygen Saturation  during 6 min walk  100 %  -    Max Ex. HR  100 bpm  120 bpm    Max Ex. BP  138/64  150/70    2 Minute Post BP  124/60  -       Psychological, QOL, Others - Outcomes: PHQ 2/9: Depression screen PHQ 2/9 06/18/2017  Decreased Interest 0  Down, Depressed, Hopeless 0  PHQ - 2 Score 0  Altered sleeping 2  Tired, decreased energy 0  Change in appetite 0  Feeling bad or failure about yourself  0  Trouble concentrating 0  Moving slowly or fidgety/restless 0  Suicidal thoughts 0  PHQ-9 Score 2  Difficult doing work/chores Not difficult at all    Quality of Life: Quality of Life - 06/18/17 1303      Quality of Life Scores   Health/Function Pre  23.46 %    Socioeconomic Pre  20.93 %    Psych/Spiritual Pre  24.21 %     Family Pre  19.8 %    GLOBAL Pre  22.53 %       Personal Goals: Goals established at orientation with interventions provided to work toward goal. Personal Goals and Risk Factors at Admission - 06/18/17 1301      Core Components/Risk Factors/Patient Goals on Admission    Weight Management  Yes;Weight Loss    Intervention  Weight Management: Develop a combined nutrition and exercise program designed to reach desired caloric intake, while maintaining appropriate intake of nutrient and fiber, sodium and fats, and appropriate energy expenditure required for the weight goal.;Weight Management: Provide education and appropriate resources to help participant work on and attain dietary goals.    Admit Weight  181 lb 4.8 oz (82.2 kg)    Goal Weight: Short Term  176 lb (79.8 kg)    Goal Weight: Long Term  171 lb (77.6 kg)    Expected Outcomes  Short Term: Continue to assess and modify interventions until short term weight is achieved;Long Term: Adherence to nutrition and physical activity/exercise program aimed toward attainment of established weight goal;Weight Loss: Understanding of general recommendations for a balanced deficit meal plan, which promotes 1-2 lb weight loss per week and includes a negative energy balance of [phone] kcal/d;Understanding recommendations for meals to include 15-35% energy as protein, 25-35% energy from fat, 35-60% energy from carbohydrates, less than 247m of dietary cholesterol, 20-35 gm of total fiber daily;Understanding of distribution of calorie intake throughout the day with the consumption  of 4-5 meals/snacks    Hypertension  Yes    Intervention  Provide education on lifestyle modifcations including regular physical activity/exercise, weight management, moderate sodium restriction and increased consumption of fresh fruit, vegetables, and low fat dairy, alcohol moderation, and smoking cessation.;Monitor prescription use compliance.    Expected Outcomes  Short Term:  Continued assessment and intervention until BP is < 140/56m HG in hypertensive participants. < 130/843mHG in hypertensive participants with diabetes, heart failure or chronic kidney disease.;Long Term: Maintenance of blood pressure at goal levels.    Lipids  Yes    Intervention  Provide education and support for participant on nutrition & aerobic/resistive exercise along with prescribed medications to achieve LDL <7062mHDL >1m33m  Expected Outcomes  Short Term: Participant states understanding of desired cholesterol values and is compliant with medications prescribed. Participant is following exercise prescription and nutrition guidelines.;Long Term: Cholesterol controlled with medications as prescribed, with individualized exercise RX and with personalized nutrition plan. Value goals: LDL < 70mg2mL > 40 mg.        Personal Goals Discharge: Goals and Risk Factor Review    Row Name 07/23/17 1401 08/22/17 1732           Core Components/Risk Factors/Patient Goals Review   Personal Goals Review  Weight Management/Obesity;Lipids;Hypertension  Weight Management/Obesity;Lipids;Hypertension      Review  weight is currently down to 174lbs from 186lbs. LutheLulad"Luciana Axels things are going "good. Boyd Luciana Axe he already eats healthy alreaady even if they go out they eat salmon or a salad. Boyd Luciana Axesick with a virus on THursday and got Augmentin from his doctor. Did not have a fever and is feeling better today. HIs blood pressure has been good. MD follows his lipid blood work which he feels it will be good again since he eats good.   Pt is taking all meds as directed.  Pt is maintaining weight.  BP has been in normal range during HT class.  He is walking 2 miles outside or at the mall on days not at HT.  [facility]  Expected Outcomes  Heart healthy weight and blood pressure.  Short - attend class regularly Long - continue to maintain weight and take meds as directed         Exercise Goals and Review: Exercise  Goals    Row Name 06/18/17 1428             Exercise Goals   Increase Physical Activity  Yes       Intervention  Provide advice, education, support and counseling about physical activity/exercise needs.;Develop an individualized exercise prescription for aerobic and resistive training based on initial evaluation findings, risk stratification, comorbidities and participant's personal goals.       Expected Outcomes  Short Term: Attend rehab on a regular basis to increase amount of physical activity.;Long Term: Add in home exercise to make exercise part of routine and to increase amount of physical activity.;Long Term: Exercising regularly at least 3-5 days a week.       Increase Strength and Stamina  Yes       Intervention  Provide advice, education, support and counseling about physical activity/exercise needs.;Develop an individualized exercise prescription for aerobic and resistive training based on initial evaluation findings, risk stratification, comorbidities and participant's personal goals.       Expected Outcomes  Short Term: Increase workloads from initial exercise prescription for resistance, speed, and METs.;Short Term: Perform resistance training exercises routinely during rehab and  add in resistance training at home;Long Term: Improve cardiorespiratory fitness, muscular endurance and strength as measured by increased METs and functional capacity (6MWT)       Able to understand and use rate of perceived exertion (RPE) scale  Yes       Intervention  Provide education and explanation on how to use RPE scale       Expected Outcomes  Short Term: Able to use RPE daily in rehab to express subjective intensity level;Long Term:  Able to use RPE to guide intensity level when exercising independently       Knowledge and understanding of Target Heart Rate Range (THRR)  Yes       Intervention  Provide education and explanation of THRR including how the numbers were predicted and where they are  located for reference       Expected Outcomes  Short Term: Able to state/look up THRR;Long Term: Able to use THRR to govern intensity when exercising independently;Short Term: Able to use daily as guideline for intensity in rehab       Able to check pulse independently  Yes       Intervention  Provide education and demonstration on how to check pulse in carotid and radial arteries.;Review the importance of being able to check your own pulse for safety during independent exercise       Expected Outcomes  Short Term: Able to explain why pulse checking is important during independent exercise;Long Term: Able to check pulse independently and accurately       Understanding of Exercise Prescription  Yes       Intervention  Provide education, explanation, and written materials on patient's individual exercise prescription       Expected Outcomes  Short Term: Able to explain program exercise prescription;Long Term: Able to explain home exercise prescription to exercise independently          Nutrition & Weight - Outcomes: Pre Biometrics - 06/18/17 1428      Pre Biometrics   Height  5' 8.9" (1.75 m)    Weight  181 lb 4.8 oz (82.2 kg)    Waist Circumference  37.5 inches    Hip Circumference  42 inches    Waist to Hip Ratio  0.89 %    BMI (Calculated)  26.85    Single Leg Stand  25.92 seconds      Post Biometrics - 08/30/17 1617       Post  Biometrics   Height  5' 8.9" (1.75 m)    Weight  185 lb (83.9 kg)    Waist Circumference  39 inches    Hip Circumference  42 inches    Waist to Hip Ratio  0.93 %    BMI (Calculated)  27.4    Single Leg Stand  15.6 seconds       Nutrition: Nutrition Therapy & Goals - 08/23/17 1709      Nutrition Therapy   Diet  DASH    Protein (specify units)  8-9oz    Fiber  30 grams    Whole Grain Foods  3 servings    Saturated Fats  15 max. grams    Fruits and Vegetables  5 servings/day 8 ideal    Sodium  1500 grams      Personal Nutrition Goals    Nutrition Goal  Include more vegetables in your daily diet    Personal Goal #2  Look for dietary sources of omega-3 fatty acids as discussed. These are heart-healthy  fats and may also help slow memory loss    Personal Goal #3  Continue to read nutrition facts labels more often to identify items high / low in sodium    Comments  He and his wife have started to follow a more heart-healthy eating plan. They use less salt when cooking and he has started to look at nutrition facts labels since starting this class. Likes fruits but does not eat as many vegetables gave handout on low sodium eating      Intervention Plan   Intervention  Prescribe, educate and counsel regarding individualized specific dietary modifications aiming towards targeted core components such as weight, hypertension, lipid management, diabetes, heart failure and other comorbidities.;Nutrition handout(s) given to patient.    Expected Outcomes  Short Term Goal: Understand basic principles of dietary content, such as calories, fat, sodium, cholesterol and nutrients.;Short Term Goal: A plan has been developed with personal nutrition goals set during dietitian appointment.;Long Term Goal: Adherence to prescribed nutrition plan.       Nutrition Discharge: Nutrition Assessments - 06/18/17 1300      MEDFICTS Scores   Pre Score  43       Education Questionnaire Score: Knowledge Questionnaire Score - 06/18/17 1311      Knowledge Questionnaire Score   Pre Score  19/28 reviewed correct responses with Luciana Axe today. He verbalized understanding and had no further questions       Goals reviewed with patient; copy given to patient.

## 2017-09-06 NOTE — Progress Notes (Signed)
Cardiac Individual Treatment Plan  Patient Details  Name: Troy Chavez MRN: [id number] Date of Birth: 1945-10-03 Referring Provider:     Cardiac Rehab from 06/18/2017 in [facility] Cardiac and Pulmonary Rehab  Referring Provider  Kathlyn Sacramento MD      Initial Encounter Date:    Cardiac Rehab from 06/18/2017 in [facility] Cardiac and Pulmonary Rehab  Date  06/18/17  Referring Provider  Kathlyn Sacramento MD      Visit Diagnosis: ST elevation myocardial infarction (STEMI), unspecified artery ([place])  Patient's Home Medications on Admission:  Current Outpatient Medications:  .  amLODipine (NORVASC) 5 MG tablet, Take 5 mg by mouth 2 (two) times daily., Disp: , Rfl: 11 .  aspirin 81 MG chewable tablet, Chew 1 tablet (81 mg total) by mouth daily., Disp: 30 tablet, Rfl: 0 .  atorvastatin (LIPITOR) 80 MG tablet, Take 1 tablet (80 mg total) by mouth daily at 6 PM., Disp: 30 tablet, Rfl: 0 .  carvedilol (COREG) 3.125 MG tablet, Take 1 tablet (3.125 mg total) by mouth 2 (two) times daily with a meal., Disp: 60 tablet, Rfl: 0 .  galantamine (RAZADYNE) 8 MG tablet, Take 8 mg by mouth 2 (two) times daily., Disp: , Rfl: 11 .  pantoprazole (PROTONIX) 40 MG tablet, Take 1 tablet (40 mg total) by mouth daily., Disp: 60 tablet, Rfl: 0 .  tamsulosin (FLOMAX) 0.4 MG CAPS capsule, Take 0.4 mg by mouth daily., Disp: , Rfl:  .  ticagrelor (BRILINTA) 90 MG TABS tablet, Take 1 tablet (90 mg total) by mouth 2 (two) times daily., Disp: 180 tablet, Rfl: 0  Past Medical History: Past Medical History:  Diagnosis Date  . Alzheimer's disease   . CKD (chronic kidney disease), stage IV ([place])   . Hypertension     Tobacco Use: Social History   Tobacco Use  Smoking Status Former Smoker  Smokeless Tobacco Never Used  Tobacco Comment   Quit 35 yeaers ago    Labs: Recent Merchant navy officer for [employer] Cardiac and Pulmonary Rehab Latest Ref Rng & Units 05/29/2017   Cholestrol 0 - 200 mg/dL 214(H)   LDLCALC 0 -  99 mg/dL 146(H)   HDL >40 mg/dL 52   Trlycerides <150 mg/dL 82       Exercise Target Goals:    Exercise Program Goal: Individual exercise prescription set using results from initial 6 min walk test and THRR while considering  patient's activity barriers and safety.   Exercise Prescription Goal: Initial exercise prescription builds to 30-45 minutes a day of aerobic activity, 2-3 days per week.  Home exercise guidelines will be given to patient during program as part of exercise prescription that the participant will acknowledge.  Activity Barriers & Risk Stratification: Activity Barriers & Cardiac Risk Stratification - 06/18/17 1322      Activity Barriers & Cardiac Risk Stratification   Activity Barriers  Arthritis;Back Problems;Deconditioning;Muscular Weakness Weather changes can affect arthritis    Cardiac Risk Stratification  High       6 Minute Walk: 6 Minute Walk    Row Name 06/18/17 1425 08/30/17 1618       6 Minute Walk   Phase  Initial  Discharge    Distance  1590 feet  1925 feet    Distance % Change  -  21 %    Distance Feet Change  -  335 ft    Walk Time  6 minutes  6 minutes    # of Rest Breaks  0  0    MPH  3.01  3.65    METS  3.79  4.19    RPE  11  13    Perceived Dyspnea   -  1    VO2 Peak  11.8  14.66    Symptoms  No  No    Resting HR  61 bpm  69 bpm    Resting BP  134/62  106/64    Resting Oxygen Saturation   100 %  99 %    Exercise Oxygen Saturation  during 6 min walk  100 %  -    Max Ex. HR  100 bpm  120 bpm    Max Ex. BP  138/64  150/70    2 Minute Post BP  124/60  -       Oxygen Initial Assessment:   Oxygen Re-Evaluation:   Oxygen Discharge (Final Oxygen Re-Evaluation):   Initial Exercise Prescription: Initial Exercise Prescription - 06/18/17 1400      Date of Initial Exercise RX and Referring Provider   Date  06/18/17    Referring Provider  Kathlyn Sacramento MD      Treadmill   MPH  3    Grade  0.5    Minutes  15    METs  3.5       Recumbant Bike   Level  3    RPM  40    Watts  36    Minutes  15    METs  3.5      REL-XR   Level  2    Speed  50    Minutes  15    METs  3.5      Prescription Details   Frequency (times per week)  3    Duration  Progress to 45 minutes of aerobic exercise without signs/symptoms of physical distress      Intensity   THRR 40-80% of Max Heartrate  95-130    Ratings of Perceived Exertion  11-13    Perceived Dyspnea  0-4      Progression   Progression  Continue to progress workloads to maintain intensity without signs/symptoms of physical distress.      Resistance Training   Training Prescription  Yes    Weight  4 lbs    Reps  10-15       Perform Capillary Blood Glucose checks as needed.  Exercise Prescription Changes: Exercise Prescription Changes    Row Name 06/18/17 1300 07/11/17 1200 07/25/17 1200 09/05/17 1500       Response to Exercise   Blood Pressure (Admit)  134/62  104/56  118/78  146/70    Blood Pressure (Exercise)  138/64  148/72  144/74  -    Blood Pressure (Exit)  124/60  -  124/70  110/62    Heart Rate (Admit)  61 bpm  70 bpm  80 bpm  98 bpm    Heart Rate (Exercise)  100 bpm  112 bpm  110 bpm  113 bpm    Heart Rate (Exit)  59 bpm  49 bpm  65 bpm  78 bpm    Oxygen Saturation (Admit)  100 %  -  -  -    Oxygen Saturation (Exercise)  100 %  -  -  -    Rating of Perceived Exertion (Exercise)  _0 Symptoms  none  none  none  none    Comments  walk test results  -  -  -    Duration  -  -  Continue with 45 min of aerobic exercise without signs/symptoms of physical distress.  Continue with 45 min of aerobic exercise without signs/symptoms of physical distress.    Intensity  -  -  THRR unchanged  THRR unchanged      Progression   Progression  -  Continue to progress workloads to maintain intensity without signs/symptoms of physical distress.  Continue to progress workloads to maintain intensity without signs/symptoms of physical distress.   Continue to progress workloads to maintain intensity without signs/symptoms of physical distress.    Average METs  -  3.7  3.77  3.7      Resistance Training   Training Prescription  -  Yes  Yes  Yes    Weight  -  4 lb  4 lb  4 lb    Reps  -  10-15  10-15  10-15      Interval Training   Interval Training  -  No  No  No      Treadmill   MPH  -  3  -  3    Grade  -  0.5  -  0.5    Minutes  -  15  -  15    METs  -  3.7  -  3.7      REL-XR   Level  -  _0 Speed  -  50  50  50    Minutes  -  _1 METs  -  -  3.77  -       Exercise Comments: Exercise Comments    Row Name 06/27/17 1629 07/18/17 1731 09/06/17 1620       Exercise Comments  First full day of exercise!  Patient was oriented to gym and equipment including functions, settings, policies, and procedures.  Patient's individual exercise prescription and treatment plan were reviewed.  All starting workloads were established based on the results of the 6 minute walk test done at initial orientation visit.  The plan for exercise progression was also introduced and progression will be customized based on patient's performance and goals  Reviewed home exercise with pt today.  Pt plans to walk for exercise.  Reviewed THR, pulse, RPE, sign and symptoms, NTG use, and when to call 911 or MD.  Also discussed weather considerations and indoor options.  Pt voiced understanding.  [place] graduated today from  rehab with 36 sessions completed.  Details of the patient's exercise prescription and what He needs to do in order to continue the prescription and progress were discussed with patient.  Patient was given a copy of prescription and goals.  Patient verbalized understanding.  Daijon plans to continue to exercise by staying active at home.        Exercise Goals and Review: Exercise Goals    Row Name 06/18/17 1428             Exercise Goals   Increase Physical Activity  Yes       Intervention  Provide advice, education,  support and counseling about physical activity/exercise needs.;Develop an individualized exercise prescription for aerobic and resistive training based on initial evaluation findings, risk stratification, comorbidities and participant's personal goals.       Expected Outcomes  Short Term: Attend rehab on a regular basis to increase amount of physical  activity.;Long Term: Add in home exercise to make exercise part of routine and to increase amount of physical activity.;Long Term: Exercising regularly at least 3-5 days a week.       Increase Strength and Stamina  Yes       Intervention  Provide advice, education, support and counseling about physical activity/exercise needs.;Develop an individualized exercise prescription for aerobic and resistive training based on initial evaluation findings, risk stratification, comorbidities and participant's personal goals.       Expected Outcomes  Short Term: Increase workloads from initial exercise prescription for resistance, speed, and METs.;Short Term: Perform resistance training exercises routinely during rehab and add in resistance training at home;Long Term: Improve cardiorespiratory fitness, muscular endurance and strength as measured by increased METs and functional capacity (6MWT)       Able to understand and use rate of perceived exertion (RPE) scale  Yes       Intervention  Provide education and explanation on how to use RPE scale       Expected Outcomes  Short Term: Able to use RPE daily in rehab to express subjective intensity level;Long Term:  Able to use RPE to guide intensity level when exercising independently       Knowledge and understanding of Target Heart Rate Range (THRR)  Yes       Intervention  Provide education and explanation of THRR including how the numbers were predicted and where they are located for reference       Expected Outcomes  Short Term: Able to state/look up THRR;Long Term: Able to use THRR to govern intensity when exercising  independently;Short Term: Able to use daily as guideline for intensity in rehab       Able to check pulse independently  Yes       Intervention  Provide education and demonstration on how to check pulse in carotid and radial arteries.;Review the importance of being able to check your own pulse for safety during independent exercise       Expected Outcomes  Short Term: Able to explain why pulse checking is important during independent exercise;Long Term: Able to check pulse independently and accurately       Understanding of Exercise Prescription  Yes       Intervention  Provide education, explanation, and written materials on patient's individual exercise prescription       Expected Outcomes  Short Term: Able to explain program exercise prescription;Long Term: Able to explain home exercise prescription to exercise independently          Exercise Goals Re-Evaluation : Exercise Goals Re-Evaluation    Row Name 06/27/17 1629 07/11/17 1246 07/18/17 1731 07/25/17 1240 09/05/17 1520     Exercise Goal Re-Evaluation   Exercise Goals Review  Able to understand and use rate of perceived exertion (RPE) scale;Knowledge and understanding of Target Heart Rate Range (THRR);Understanding of Exercise Prescription  Increase Physical Activity;Able to understand and use rate of perceived exertion (RPE) scale;Knowledge and understanding of Target Heart Rate Range (THRR);Increase Strength and Stamina  Increase Physical Activity;Increase Strength and Stamina;Able to understand and use rate of perceived exertion (RPE) scale;Knowledge and understanding of Target Heart Rate Range (THRR);Able to check pulse independently;Understanding of Exercise Prescription  Increase Physical Activity;Able to understand and use rate of perceived exertion (RPE) scale;Knowledge and understanding of Target Heart Rate Range (THRR);Increase Strength and Stamina  Increase Physical Activity;Able to understand and use rate of perceived exertion (RPE)  scale;Increase Strength and Stamina;Knowledge and understanding of Target Heart Rate Range (  THRR)   Comments  Reviewed RPE scale, THR and program prescription with pt today.  Pt voiced understanding and was given a copy of goals to take home.   Pt is tolerating exercise well and reaching THR range during exercise  Reviewed home exercise with pt today.  Pt plans to walk for exercise.  Reviewed THR, pulse, RPE, sign and symptoms, NTG use, and when to call 911 or MD.  Also discussed weather considerations and indoor options.  Pt voiced understanding.  Troy Chavez is progressing well with exercise and has increased resistance on the XR. Staff wil add intervals to his program  Pt improved by 21% on post 6 min walk test.  Overall MET level improved as well   Expected Outcomes  Short: Use RPE daily to regulate intensity.  Long: Follow program prescription in THR.  Short - Pt will attend regularly Long - Pt will increase overall MET level  Short - Pt will continue to walk on days not at [facility] and will monitor RPE and HR Long - Pt will maintain exercise independently  Short  -Troy Chavez will add interval training Long - Troy Chavez will increase oiverall MET level  Pt will maintain fitness on his own      Discharge Exercise Prescription (Final Exercise Prescription Changes): Exercise Prescription Changes - 09/05/17 1500      Response to Exercise   Blood Pressure (Admit)  146/70    Blood Pressure (Exit)  110/62    Heart Rate (Admit)  98 bpm    Heart Rate (Exercise)  113 bpm    Heart Rate (Exit)  78 bpm    Rating of Perceived Exertion (Exercise)  11    Symptoms  none    Duration  Continue with 45 min of aerobic exercise without signs/symptoms of physical distress.    Intensity  THRR unchanged      Progression   Progression  Continue to progress workloads to maintain intensity without signs/symptoms of physical distress.    Average METs  3.7      Resistance Training   Training Prescription  Yes    Weight  4 lb    Reps   10-15      Interval Training   Interval Training  No      Treadmill   MPH  3    Grade  0.5    Minutes  15    METs  3.7      REL-XR   Level  3    Speed  50    Minutes  15       Nutrition:  Target Goals: Understanding of nutrition guidelines, daily intake of sodium <1582m, cholesterol <2034m calories 30% from fat and 7% or less from saturated fats, daily to have 5 or more servings of fruits and vegetables.  Biometrics: Pre Biometrics - 06/18/17 1428      Pre Biometrics   Height  5' 8.9" (1.75 m)    Weight  181 lb 4.8 oz (82.2 kg)    Waist Circumference  37.5 inches    Hip Circumference  42 inches    Waist to Hip Ratio  0.89 %    BMI (Calculated)  26.85    Single Leg Stand  25.92 seconds      Post Biometrics - 08/30/17 1617       Post  Biometrics   Height  5' 8.9" (1.75 m)    Weight  185 lb (83.9 kg)    Waist Circumference  39 inches  Hip Circumference  42 inches    Waist to Hip Ratio  0.93 %    BMI (Calculated)  27.4    Single Leg Stand  15.6 seconds       Nutrition Therapy Plan and Nutrition Goals: Nutrition Therapy & Goals - 08/23/17 1709      Nutrition Therapy   Diet  DASH    Protein (specify units)  8-9oz    Fiber  30 grams    Whole Grain Foods  3 servings    Saturated Fats  15 max. grams    Fruits and Vegetables  5 servings/day 8 ideal    Sodium  1500 grams      Personal Nutrition Goals   Nutrition Goal  Include more vegetables in your daily diet    Personal Goal #2  Look for dietary sources of omega-3 fatty acids as discussed. These are heart-healthy fats and may also help slow memory loss    Personal Goal #3  Continue to read nutrition facts labels more often to identify items high / low in sodium    Comments  He and his wife have started to follow a more heart-healthy eating plan. They use less salt when cooking and he has started to look at nutrition facts labels since starting this class. Likes fruits but does not eat as many vegetables gave  handout on low sodium eating      Intervention Plan   Intervention  Prescribe, educate and counsel regarding individualized specific dietary modifications aiming towards targeted core components such as weight, hypertension, lipid management, diabetes, heart failure and other comorbidities.;Nutrition handout(s) given to patient.    Expected Outcomes  Short Term Goal: Understand basic principles of dietary content, such as calories, fat, sodium, cholesterol and nutrients.;Short Term Goal: A plan has been developed with personal nutrition goals set during dietitian appointment.;Long Term Goal: Adherence to prescribed nutrition plan.       Nutrition Assessments: Nutrition Assessments - 06/18/17 1300      MEDFICTS Scores   Pre Score  43       Nutrition Goals Re-Evaluation: Nutrition Goals Re-Evaluation    Row Name 07/23/17 1406 08/23/17 1716 08/23/17 1717         Goals   Current Weight  174 lb (78.9 kg)  -  -     Nutrition Goal  [place] healthy eating already even when they got out to eat and Troy Chavez reports that his wife cooks healthy and is a good Training and development officer.  Look for dietary sources of omega-3 fatty acids as discussed. These are heart-healthy fats and may also help slow memory loss  Include more servings of vegetables in your daily diet     Comment  -  He is interested in learning about what foods can aid with memory, and also which types of fat are better to eat  He does not consume as many vegetables as he does fruit     Expected Outcome  Heart healthy eating.   He will choose fat sources that are rich in omega-3s and ask his wife to switch to olive oil for cooking  He will consume at least 2 servings of vegetables daily       Personal Goal #2 Re-Evaluation   Personal Goal #2  -  -  Continue to read nutrition facts labels more regularly, identifying items high / low in sodium        Nutrition Goals Discharge (Final Nutrition Goals Re-Evaluation): Nutrition Goals Re-Evaluation - 08/23/17  1717  Goals   Nutrition Goal  Include more servings of vegetables in your daily diet    Comment  He does not consume as many vegetables as he does fruit    Expected Outcome  He will consume at least 2 servings of vegetables daily      Personal Goal #2 Re-Evaluation   Personal Goal #2  Continue to read nutrition facts labels more regularly, identifying items high / low in sodium       Psychosocial: Target Goals: Acknowledge presence or absence of significant depression and/or stress, maximize coping skills, provide positive support system. Participant is able to verbalize types and ability to use techniques and skills needed for reducing stress and depression.   Initial Review & Psychosocial Screening: Initial Psych Review & Screening - 06/18/17 1301      Initial Review   Current issues with  Current Sleep Concerns Sleep concerns are chronic. Trouble getting to sleep      [place]?  Yes Spouse Becky       Barriers   Psychosocial barriers to participate in program  There are no identifiable barriers or psychosocial needs.;The patient should benefit from training in stress management and relaxation.      Screening Interventions   Interventions  Encouraged to exercise;Provide feedback about the scores to participant    Expected Outcomes  Short Term goal: Utilizing psychosocial counselor, staff and physician to assist with identification of specific Stressors or current issues interfering with healing process. Setting desired goal for each stressor or current issue identified.;Long Term Goal: Stressors or current issues are controlled or eliminated.;Short Term goal: Identification and review with participant of any Quality of Life or Depression concerns found by scoring the questionnaire.;Long Term goal: The participant improves quality of Life and PHQ9 Scores as seen by post scores and/or verbalization of changes       Quality of Life Scores:  Quality of  Life - 06/18/17 1303      Quality of Life Scores   Health/Function Pre  23.46 %    Socioeconomic Pre  20.93 %    Psych/Spiritual Pre  24.21 %    Family Pre  19.8 %    GLOBAL Pre  22.53 %      Scores of 19 and below usually indicate a poorer quality of life in these areas.  A difference of  2-3 points is a clinically meaningful difference.  A difference of 2-3 points in the total score of the Quality of Life Index has been associated with significant improvement in overall quality of life, self-image, physical symptoms, and general health in studies assessing change in quality of life.  PHQ-9: Recent Review Flowsheet Data    Depression screen [facility] 2/9 06/18/2017   Decreased Interest 0   Down, Depressed, Hopeless 0   PHQ - 2 Score 0   Altered sleeping 2   Tired, decreased energy 0   Change in appetite 0   Feeling bad or failure about yourself  0   Trouble concentrating 0   Moving slowly or fidgety/restless 0   Suicidal thoughts 0   PHQ-9 Score 2   Difficult doing work/chores Not difficult at all     Interpretation of Total Score  Total Score Depression Severity:  1-4 = Minimal depression, 5-9 = Mild depression, 10-14 = Moderate depression, 15-19 = Moderately severe depression, 20-27 = Severe depression   Psychosocial Evaluation and Intervention: Psychosocial Evaluation - 07/09/17 1711      Psychosocial  Evaluation & Interventions   Interventions  Encouraged to exercise with the program and follow exercise prescription    Comments  Counselor met with Mr. Pressnell Troy Chavez) today for initial psychosocial evaluation.  He is a 72 year old who had a heart attack and stent inserted several weeks ago.  He has a strong support system with a spouse of 36 years; several step daughters locally as well as a son who lives in the [place] area.  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  He sleeps well and has a good appetite.  Troy Chavez denies a history of depression or anxiety or any current symptoms.  He has minimal stress in his life and states he is typically in a positive mood.  Troy Chavez has goals to increase his stamina and return to his normal daily activities.  He will be followed by staff.      Expected Outcomes  Troy Chavez will benefit from consistent exercise to achieve his stated goals.  The educational and psychoeducational components of this program will be helpful in understanding and managing his heart condition.      Continue Psychosocial Services   Follow up required by staff       Psychosocial Re-Evaluation: Psychosocial Re-Evaluation    [place] Name 07/23/17 1405             Psychosocial Re-Evaluation   Comments  Troy Chavez and his wife feel Cardiac REhab is "enjoyable for [employer] and he likes it.        Interventions  Encouraged to attend Cardiac Rehabilitation for the exercise          Psychosocial Discharge (Final Psychosocial Re-Evaluation): Psychosocial Re-Evaluation - 07/23/17 1405      Psychosocial Re-Evaluation   Comments  Troy Chavez and his wife feel Cardiac REhab is "enjoyable for [employer] and he likes it.     Interventions  Encouraged to attend Cardiac Rehabilitation for the exercise       Vocational Rehabilitation: Provide vocational rehab assistance to qualifying candidates.   Vocational Rehab Evaluation & Intervention: Vocational Rehab - 06/18/17 1311      Initial Vocational Rehab Evaluation & Intervention   Assessment shows need for Vocational Rehabilitation  No       Education: Education Goals: Education classes will be provided on a variety of topics geared toward better understanding of heart health and risk factor modification. Participant will state understanding/return demonstration of topics presented as noted by education test scores.  Learning Barriers/Preferences: Learning Barriers/Preferences - 06/18/17 1310      Learning  Barriers/Preferences   Learning Barriers  Hearing Has tinnitis    Learning Preferences  Skilled Demonstration;Verbal Instruction;Individual Instruction       Education Topics:  AED/CPR: - Group verbal and written instruction with the use of models to demonstrate the basic use of the AED with the basic ABC's of resuscitation.   General Nutrition Guidelines/Fats and Fiber: -Group instruction provided by verbal, written material, models and posters to present the general guidelines for heart healthy nutrition. Gives an explanation and review of dietary fats and fiber.   Cardiac Rehab from 09/05/2017 in [facility] Cardiac and Pulmonary Rehab  Date  07/30/17  Educator  PI  Instruction Review Code  1- Verbalizes Understanding      Controlling Sodium/Reading Food Labels: -Group verbal and written material supporting the discussion of sodium use in heart healthy  nutrition. Review and explanation with models, verbal and written materials for utilization of the food label.   Cardiac Rehab from 09/05/2017 in [facility] Cardiac and Pulmonary Rehab  Date  08/06/17  Educator  PI  Instruction Review Code  1- Verbalizes Understanding      Exercise Physiology & General Exercise Guidelines: - Group verbal and written instruction with models to review the exercise physiology of the cardiovascular system and associated critical values. Provides general exercise guidelines with specific guidelines to those with heart or lung disease.    Aerobic Exercise & Resistance Training: - Gives group verbal and written instruction on the various components of exercise. Focuses on aerobic and resistive training programs and the benefits of this training and how to safely progress through these programs..   Cardiac Rehab from 09/05/2017 in [facility] Cardiac and Pulmonary Rehab  Date  08/27/17  Educator  Wynelle Fanny  Instruction Review Code  1- Verbalizes Understanding      Flexibility, Balance, Mind/Body Relaxation: Provides group  verbal/written instruction on the benefits of flexibility and balance training, including mind/body exercise modes such as yoga, pilates and tai chi.  Demonstration and skill practice provided.   Cardiac Rehab from 09/05/2017 in [facility] Cardiac and Pulmonary Rehab  Date  08/29/17  Educator  [facility]  Instruction Review Code  1- Verbalizes Understanding      Stress and Anxiety: - Provides group verbal and written instruction about the health risks of elevated stress and causes of high stress.  Discuss the correlation between heart/lung disease and anxiety and treatment options. Review healthy ways to manage with stress and anxiety.   Cardiac Rehab from 09/05/2017 in [facility] Cardiac and Pulmonary Rehab  Date  09/05/17  Educator  [facility]  Instruction Review Code  1- Verbalizes Understanding      Depression: - Provides group verbal and written instruction on the correlation between heart/lung disease and depressed mood, treatment options, and the stigmas associated with seeking treatment.   Cardiac Rehab from 09/05/2017 in [facility] Cardiac and Pulmonary Rehab  Date  08/22/17  Educator  [facility]  Instruction Review Code  1- Verbalizes Understanding      Anatomy & Physiology of the Heart: - Group verbal and written instruction and models provide basic cardiac anatomy and physiology, with the coronary electrical and arterial systems. Review of Valvular disease and Heart Failure   Cardiac Rehab from 09/05/2017 in [facility] Cardiac and Pulmonary Rehab  Date  09/03/17  Educator  CE  Instruction Review Code  1- Verbalizes Understanding      Cardiac Procedures: - Group verbal and written instruction to review commonly prescribed medications for heart disease. Reviews the medication, class of the drug, and side effects. Includes the steps to properly store meds and maintain the prescription regimen. (beta blockers and nitrates)   Cardiac Rehab from 09/05/2017 in [facility] Cardiac and Pulmonary Rehab  Date  08/01/17  Educator  [facility]   Instruction Review Code  1- Verbalizes Understanding      Cardiac Medications I: - Group verbal and written instruction to review commonly prescribed medications for heart disease. Reviews the medication, class of the drug, and side effects. Includes the steps to properly store meds and maintain the prescription regimen.   Cardiac Medications II: -Group verbal and written instruction to review commonly prescribed medications for heart disease. Reviews the medication, class of the drug, and side effects. (all other drug classes)   Cardiac Rehab from 09/05/2017 in [facility] Cardiac and Pulmonary Rehab  Date  07/09/17  Educator  CE  Instruction Review Code  1- Verbalizes Understanding       Go Sex-Intimacy & Heart Disease, Get SMART - Goal Setting: - Group verbal and written instruction through game format to discuss heart disease and the return to sexual intimacy. Provides group verbal and written material to discuss and apply goal setting through the application of the S.M.A.R.T. Method.   Cardiac Rehab from 09/05/2017 in [facility] Cardiac and Pulmonary Rehab  Date  08/01/17  Educator  [facility]  Instruction Review Code  1- Verbalizes Understanding      Other Matters of the Heart: - Provides group verbal, written materials and models to describe Stable Angina and Peripheral Artery. Includes description of the disease process and treatment options available to the cardiac patient.   Cardiac Rehab from 09/05/2017 in [facility] Cardiac and Pulmonary Rehab  Date  09/03/17  Educator  CE  Instruction Review Code  1- Verbalizes Understanding      Exercise & Equipment Safety: - Individual verbal instruction and demonstration of equipment use and safety with use of the equipment.   Cardiac Rehab from 09/05/2017 in [facility] Cardiac and Pulmonary Rehab  Date  06/18/17  Educator  [facility]  Instruction Review Code  1- Verbalizes Understanding      Infection Prevention: - Provides verbal and written material to individual  with discussion of infection control including proper hand washing and proper equipment cleaning during exercise session.   Cardiac Rehab from 09/05/2017 in [facility] Cardiac and Pulmonary Rehab  Date  06/18/17  Educator  [facility]  Instruction Review Code  1- Verbalizes Understanding      Falls Prevention: - Provides verbal and written material to individual with discussion of falls prevention and safety.   Cardiac Rehab from 09/05/2017 in [facility] Cardiac and Pulmonary Rehab  Date  06/18/17  Educator  [facility]  Instruction Review Code  1- Verbalizes Understanding      Diabetes: - Individual verbal and written instruction to review signs/symptoms of diabetes, desired ranges of glucose level fasting, after meals and with exercise. Acknowledge that pre and post exercise glucose checks will be done for 3 sessions at entry of program.   Know Your Numbers and Risk Factors: -Group verbal and written instruction about important numbers in your health.  Discussion of what are risk factors and how they play a role in the disease process.  Review of Cholesterol, Blood Pressure, Diabetes, and BMI and the role they play in your overall health.   Cardiac Rehab from 09/05/2017 in [facility] Cardiac and Pulmonary Rehab  Date  07/09/17  Educator  CE  Instruction Review Code  1- Verbalizes Understanding      Sleep Hygiene: -Provides group verbal and written instruction about how sleep can affect your health.  Define sleep hygiene, discuss sleep cycles and impact of sleep habits. Review good sleep hygiene tips.    Other: -Provides group and verbal instruction on various topics (see comments)   Knowledge Questionnaire Score: Knowledge Questionnaire Score - 06/18/17 1311      Knowledge Questionnaire Score   Pre Score  19/28 reviewed correct responses with Troy Chavez today. He verbalized understanding and had no further questions       Core Components/Risk Factors/Patient Goals at Admission: Personal Goals and Risk Factors at  Admission - 06/18/17 1301      Core Components/Risk Factors/Patient Goals on Admission    Weight Management  Yes;Weight Loss    Intervention  Weight Management: Develop a combined nutrition and exercise program designed to reach desired caloric  intake, while maintaining appropriate intake of nutrient and fiber, sodium and fats, and appropriate energy expenditure required for the weight goal.;Weight Management: Provide education and appropriate resources to help participant work on and attain dietary goals.    Admit Weight  181 lb 4.8 oz (82.2 kg)    Goal Weight: Short Term  176 lb (79.8 kg)    Goal Weight: Long Term  171 lb (77.6 kg)    Expected Outcomes  Short Term: Continue to assess and modify interventions until short term weight is achieved;Long Term: Adherence to nutrition and physical activity/exercise program aimed toward attainment of established weight goal;Weight Loss: Understanding of general recommendations for a balanced deficit meal plan, which promotes 1-2 lb weight loss per week and includes a negative energy balance of [phone] kcal/d;Understanding recommendations for meals to include 15-35% energy as protein, 25-35% energy from fat, 35-60% energy from carbohydrates, less than 22m of dietary cholesterol, 20-35 gm of total fiber daily;Understanding of distribution of calorie intake throughout the day with the consumption of 4-5 meals/snacks    Hypertension  Yes    Intervention  Provide education on lifestyle modifcations including regular physical activity/exercise, weight management, moderate sodium restriction and increased consumption of fresh fruit, vegetables, and low fat dairy, alcohol moderation, and smoking cessation.;Monitor prescription use compliance.    Expected Outcomes  Short Term: Continued assessment and intervention until BP is < 140/958mHG in hypertensive participants. < 130/8035mG in hypertensive participants with diabetes, heart failure or chronic kidney  disease.;Long Term: Maintenance of blood pressure at goal levels.    Lipids  Yes    Intervention  Provide education and support for participant on nutrition & aerobic/resistive exercise along with prescribed medications to achieve LDL <63m82mDL >40mg13m Expected Outcomes  Short Term: Participant states understanding of desired cholesterol values and is compliant with medications prescribed. Participant is following exercise prescription and nutrition guidelines.;Long Term: Cholesterol controlled with medications as prescribed, with individualized exercise RX and with personalized nutrition plan. Value goals: LDL < 63mg,77m > 40 mg.       Core Components/Risk Factors/Patient Goals Review:  Goals and Risk Factor Review    Row Name 07/23/17 1401 08/22/17 1732           Core Components/Risk Factors/Patient Goals Review   Personal Goals Review  Weight Management/Obesity;Lipids;Hypertension  Weight Management/Obesity;Lipids;Hypertension      Review  weight is currently down to 174lbs from 186lbs. LutherRayyan" Troy Axes things are going "good. Boyd sLuciana Axehe already eats healthy alreaady even if they go out they eat salmon or a salad. Boyd wLuciana Axeick with a virus on THursday and got Augmentin from his doctor. Did not have a fever and is feeling better today. HIs blood pressure has been good. MD follows his lipid blood work which he feels it will be good again since he eats good.   Pt is taking all meds as directed.  Pt is maintaining weight.  BP has been in normal range during HT class.  He is walking 2 miles outside or at the mall on days not at HT.   [facility] Expected Outcomes  Heart healthy weight and blood pressure.  Short - attend class regularly Long - continue to maintain weight and take meds as directed         Core Components/Risk Factors/Patient Goals at Discharge (Final Review):  Goals and Risk Factor Review - 08/22/17 1732      Core Components/Risk Factors/Patient Goals Review  Personal Goals  Review  Weight Management/Obesity;Lipids;Hypertension    Review  Pt is taking all meds as directed.  Pt is maintaining weight.  BP has been in normal range during HT class.  He is walking 2 miles outside or at the mall on days not at [facility].    Expected Outcomes  Short - attend class regularly Long - continue to maintain weight and take meds as directed       ITP Comments: ITP Comments    Row Name 06/18/17 1257 07/04/17 0601 08/01/17 0649 08/29/17 0626     ITP Comments  Medical review completed. ITP sent to Dr Frederik Pear toreview, make changes as needed and sign.   Documentation of diagnosis can be found [facility] 05/28/2017 encounter  30 day review. Continue with ITP unless directed changes per Medical Director review.  New to program  2 visits   30 Day review. Continue with ITP unless directed changes per Medical Director review.    30 day review. Continue with ITP unless directed changes per Medical Director       Comments: Discharge ITP

## 2017-09-06 NOTE — Patient Instructions (Signed)
Discharge Patient Instructions  Patient Details  Name: Troy Chavez MRN: [id number] Date of Birth: 03-22-1946 Referring Provider:  Wellington Hampshire, MD   Number of Visits: 56  Reason for Discharge:  Patient reached a stable level of exercise. Patient independent in their exercise. Patient has met program and personal goals.  Smoking History:  Social History   Tobacco Use  Smoking Status Former Smoker  Smokeless Tobacco Never Used  Tobacco Comment   Quit 53 yeaers ago    Diagnosis:  ST elevation myocardial infarction (STEMI), unspecified artery (HCC)  Status post coronary artery stent placement  Initial Exercise Prescription: Initial Exercise Prescription - 06/18/17 1400      Date of Initial Exercise RX and Referring Provider   Date  06/18/17    Referring Provider  Kathlyn Sacramento MD      Treadmill   MPH  3    Grade  0.5    Minutes  15    METs  3.5      Recumbant Bike   Level  3    RPM  40    Watts  36    Minutes  15    METs  3.5      REL-XR   Level  2    Speed  50    Minutes  15    METs  3.5      Prescription Details   Frequency (times per week)  3    Duration  Progress to 45 minutes of aerobic exercise without signs/symptoms of physical distress      Intensity   THRR 40-80% of Max Heartrate  95-130    Ratings of Perceived Exertion  11-13    Perceived Dyspnea  0-4      Progression   Progression  Continue to progress workloads to maintain intensity without signs/symptoms of physical distress.      Resistance Training   Training Prescription  Yes    Weight  4 lbs    Reps  10-15       Discharge Exercise Prescription (Final Exercise Prescription Changes): Exercise Prescription Changes - 09/05/17 1500      Response to Exercise   Blood Pressure (Admit)  146/70    Blood Pressure (Exit)  110/62    Heart Rate (Admit)  98 bpm    Heart Rate (Exercise)  113 bpm    Heart Rate (Exit)  78 bpm    Rating of Perceived Exertion (Exercise)  11    Symptoms  none    Duration  Continue with 45 min of aerobic exercise without signs/symptoms of physical distress.    Intensity  THRR unchanged      Progression   Progression  Continue to progress workloads to maintain intensity without signs/symptoms of physical distress.    Average METs  3.7      Resistance Training   Training Prescription  Yes    Weight  4 lb    Reps  10-15      Interval Training   Interval Training  No      Treadmill   MPH  3    Grade  0.5    Minutes  15    METs  3.7      REL-XR   Level  3    Speed  50    Minutes  15       Functional Capacity: 6 Minute Walk    Row Name 06/18/17 1425 08/30/17 1618  6 Minute Walk   Phase  Initial  Discharge    Distance  1590 feet  1925 feet    Distance % Change  -  21 %    Distance Feet Change  -  335 ft    Walk Time  6 minutes  6 minutes    # of Rest Breaks  0  0    MPH  3.01  3.65    METS  3.79  4.19    RPE  11  13    Perceived Dyspnea   -  1    VO2 Peak  11.8  14.66    Symptoms  No  No    Resting HR  61 bpm  69 bpm    Resting BP  134/62  106/64    Resting Oxygen Saturation   100 %  99 %    Exercise Oxygen Saturation  during 6 min walk  100 %  -    Max Ex. HR  100 bpm  120 bpm    Max Ex. BP  138/64  150/70    2 Minute Post BP  124/60  -       Quality of Life: Quality of Life - 06/18/17 1303      Quality of Life Scores   Health/Function Pre  23.46 %    Socioeconomic Pre  20.93 %    Psych/Spiritual Pre  24.21 %    Family Pre  19.8 %    GLOBAL Pre  22.53 %       Personal Goals: Goals established at orientation with interventions provided to work toward goal. Personal Goals and Risk Factors at Admission - 06/18/17 1301      Core Components/Risk Factors/Patient Goals on Admission    Weight Management  Yes;Weight Loss    Intervention  Weight Management: Develop a combined nutrition and exercise program designed to reach desired caloric intake, while maintaining appropriate intake of  nutrient and fiber, sodium and fats, and appropriate energy expenditure required for the weight goal.;Weight Management: Provide education and appropriate resources to help participant work on and attain dietary goals.    Admit Weight  181 lb 4.8 oz (82.2 kg)    Goal Weight: Short Term  176 lb (79.8 kg)    Goal Weight: Long Term  171 lb (77.6 kg)    Expected Outcomes  Short Term: Continue to assess and modify interventions until short term weight is achieved;Long Term: Adherence to nutrition and physical activity/exercise program aimed toward attainment of established weight goal;Weight Loss: Understanding of general recommendations for a balanced deficit meal plan, which promotes 1-2 lb weight loss per week and includes a negative energy balance of [phone] kcal/d;Understanding recommendations for meals to include 15-35% energy as protein, 25-35% energy from fat, 35-60% energy from carbohydrates, less than 277m of dietary cholesterol, 20-35 gm of total fiber daily;Understanding of distribution of calorie intake throughout the day with the consumption of 4-5 meals/snacks    Hypertension  Yes    Intervention  Provide education on lifestyle modifcations including regular physical activity/exercise, weight management, moderate sodium restriction and increased consumption of fresh fruit, vegetables, and low fat dairy, alcohol moderation, and smoking cessation.;Monitor prescription use compliance.    Expected Outcomes  Short Term: Continued assessment and intervention until BP is < 140/967mHG in hypertensive participants. < 130/8023mG in hypertensive participants with diabetes, heart failure or chronic kidney disease.;Long Term: Maintenance of blood pressure at goal levels.    Lipids  Yes  Intervention  Provide education and support for participant on nutrition & aerobic/resistive exercise along with prescribed medications to achieve LDL <29m, HDL >449m    Expected Outcomes  Short Term: Participant  states understanding of desired cholesterol values and is compliant with medications prescribed. Participant is following exercise prescription and nutrition guidelines.;Long Term: Cholesterol controlled with medications as prescribed, with individualized exercise RX and with personalized nutrition plan. Value goals: LDL < 707mHDL > 40 mg.        Personal Goals Discharge: Goals and Risk Factor Review - 08/22/17 1732      Core Components/Risk Factors/Patient Goals Review   Personal Goals Review  Weight Management/Obesity;Lipids;Hypertension    Review  Pt is taking all meds as directed.  Pt is maintaining weight.  BP has been in normal range during HT class.  He is walking 2 miles outside or at the mall on days not at HT.[facility]  Expected Outcomes  Short - attend class regularly Long - continue to maintain weight and take meds as directed       Exercise Goals and Review: Exercise Goals    Row Name 06/18/17 1428             Exercise Goals   Increase Physical Activity  Yes       Intervention  Provide advice, education, support and counseling about physical activity/exercise needs.;Develop an individualized exercise prescription for aerobic and resistive training based on initial evaluation findings, risk stratification, comorbidities and participant's personal goals.       Expected Outcomes  Short Term: Attend rehab on a regular basis to increase amount of physical activity.;Long Term: Add in home exercise to make exercise part of routine and to increase amount of physical activity.;Long Term: Exercising regularly at least 3-5 days a week.       Increase Strength and Stamina  Yes       Intervention  Provide advice, education, support and counseling about physical activity/exercise needs.;Develop an individualized exercise prescription for aerobic and resistive training based on initial evaluation findings, risk stratification, comorbidities and participant's personal goals.       Expected  Outcomes  Short Term: Increase workloads from initial exercise prescription for resistance, speed, and METs.;Short Term: Perform resistance training exercises routinely during rehab and add in resistance training at home;Long Term: Improve cardiorespiratory fitness, muscular endurance and strength as measured by increased METs and functional capacity (6MWT)       Able to understand and use rate of perceived exertion (RPE) scale  Yes       Intervention  Provide education and explanation on how to use RPE scale       Expected Outcomes  Short Term: Able to use RPE daily in rehab to express subjective intensity level;Long Term:  Able to use RPE to guide intensity level when exercising independently       Knowledge and understanding of Target Heart Rate Range (THRR)  Yes       Intervention  Provide education and explanation of THRR including how the numbers were predicted and where they are located for reference       Expected Outcomes  Short Term: Able to state/look up THRR;Long Term: Able to use THRR to govern intensity when exercising independently;Short Term: Able to use daily as guideline for intensity in rehab       Able to check pulse independently  Yes       Intervention  Provide education and demonstration on how to check pulse in  carotid and radial arteries.;Review the importance of being able to check your own pulse for safety during independent exercise       Expected Outcomes  Short Term: Able to explain why pulse checking is important during independent exercise;Long Term: Able to check pulse independently and accurately       Understanding of Exercise Prescription  Yes       Intervention  Provide education, explanation, and written materials on patient's individual exercise prescription       Expected Outcomes  Short Term: Able to explain program exercise prescription;Long Term: Able to explain home exercise prescription to exercise independently          Nutrition & Weight - Outcomes: Pre  Biometrics - 06/18/17 1428      Pre Biometrics   Height  5' 8.9" (1.75 m)    Weight  181 lb 4.8 oz (82.2 kg)    Waist Circumference  37.5 inches    Hip Circumference  42 inches    Waist to Hip Ratio  0.89 %    BMI (Calculated)  26.85    Single Leg Stand  25.92 seconds      Post Biometrics - 08/30/17 1617       Post  Biometrics   Height  5' 8.9" (1.75 m)    Weight  185 lb (83.9 kg)    Waist Circumference  39 inches    Hip Circumference  42 inches    Waist to Hip Ratio  0.93 %    BMI (Calculated)  27.4    Single Leg Stand  15.6 seconds       Nutrition: Nutrition Therapy & Goals - 08/23/17 1709      Nutrition Therapy   Diet  DASH    Protein (specify units)  8-9oz    Fiber  30 grams    Whole Grain Foods  3 servings    Saturated Fats  15 max. grams    Fruits and Vegetables  5 servings/day 8 ideal    Sodium  1500 grams      Personal Nutrition Goals   Nutrition Goal  Include more vegetables in your daily diet    Personal Goal #2  Look for dietary sources of omega-3 fatty acids as discussed. These are heart-healthy fats and may also help slow memory loss    Personal Goal #3  Continue to read nutrition facts labels more often to identify items high / low in sodium    Comments  He and his wife have started to follow a more heart-healthy eating plan. They use less salt when cooking and he has started to look at nutrition facts labels since starting this class. Likes fruits but does not eat as many vegetables gave handout on low sodium eating      Intervention Plan   Intervention  Prescribe, educate and counsel regarding individualized specific dietary modifications aiming towards targeted core components such as weight, hypertension, lipid management, diabetes, heart failure and other comorbidities.;Nutrition handout(s) given to patient.    Expected Outcomes  Short Term Goal: Understand basic principles of dietary content, such as calories, fat, sodium, cholesterol and  nutrients.;Short Term Goal: A plan has been developed with personal nutrition goals set during dietitian appointment.;Long Term Goal: Adherence to prescribed nutrition plan.       Nutrition Discharge: Nutrition Assessments - 06/18/17 1300      MEDFICTS Scores   Pre Score  43       Education Questionnaire Score: Knowledge Questionnaire Score -  06/18/17 1311      Knowledge Questionnaire Score   Pre Score  19/28 reviewed correct responses with Luciana Axe today. He verbalized understanding and had no further questions       Goals reviewed with patient; copy given to patient.

## 2017-10-16 ENCOUNTER — Encounter: Payer: Self-pay | Admitting: Cardiovascular Disease

## 2017-10-16 ENCOUNTER — Ambulatory Visit: Payer: PPO | Admitting: Cardiovascular Disease

## 2017-10-16 VITALS — BP 100/70 | HR 62 | Ht 68.0 in | Wt 180.5 lb

## 2017-10-16 DIAGNOSIS — I2510 Atherosclerotic heart disease of native coronary artery without angina pectoris: Secondary | ICD-10-CM | POA: Diagnosis not present

## 2017-10-16 DIAGNOSIS — N184 Chronic kidney disease, stage 4 (severe): Secondary | ICD-10-CM | POA: Diagnosis not present

## 2017-10-16 DIAGNOSIS — I10 Essential (primary) hypertension: Secondary | ICD-10-CM

## 2017-10-16 DIAGNOSIS — I255 Ischemic cardiomyopathy: Secondary | ICD-10-CM

## 2017-10-16 DIAGNOSIS — E785 Hyperlipidemia, unspecified: Secondary | ICD-10-CM

## 2017-10-16 NOTE — Patient Instructions (Signed)
Medication Instructions: Continue same medications.   Labwork: None.   Procedures/Testing: None.   Follow-Up: 6 months with Dr. Ashlynne Shetterly.   Any Additional Special Instructions Will Be Listed Below (If Applicable).     If you need a refill on your cardiac medications before your next appointment, please call your pharmacy.   

## 2017-10-16 NOTE — Progress Notes (Signed)
Cardiology Office Note   Date:  10/16/2017   ID:  CAIDIN HEIDENREICH, DOB 1945/05/12, MRN [id number]  PCP:  Rusty Aus, MD  Cardiologist:   Kathlyn Sacramento, MD   Chief Complaint  Patient presents with  . other    3 month follow up. Meds reviewed by the pt. verbally. "doing well."       History of Present Illness: Troy Chavez is a 72 y.o. male who presents for a follow-up visit regarding coronary artery disease.  He has known history of hypertension, hyperlipidemia, mild dementia and stage IV chronic kidney disease. He presented in January of this year with lateral ST elevation myocardial infarction.  Emergent cardiac catheterization showed occluded ostial and proximal ramus intermedius artery.  There was also 60% stenosis in the proximal LAD and 70% stenosis in the mid LAD. I performed successful angioplasty and drug-eluting stent placement to the ramus without complications.  Catheterization via the right radial artery was difficult due to severe tortuosity of the innominate artery. Echocardiogram showed an EF of 45-50%.   He has been doing well with no recent chest pain, shortness of breath or palpitations.  He finished cardiac rehab.  Aspirin was discontinued by his primary care physician due to easy bruising.  He continues to take Brilinta twice daily.  Past Medical History:  Diagnosis Date  . Alzheimer's disease   . CKD (chronic kidney disease), stage IV ([place])   . Hypertension     Past Surgical History:  Procedure Laterality Date  . CORONARY/GRAFT ACUTE MI REVASCULARIZATION N/A 05/28/2017   Procedure: Coronary/Graft Acute MI Revascularization;  Surgeon: Wellington Hampshire, MD;  Location: [facility];  Service: Cardiovascular;  Laterality: N/A;  . LEFT HEART CATH AND CORONARY ANGIOGRAPHY N/A 05/28/2017   Procedure: LEFT HEART CATH AND CORONARY ANGIOGRAPHY;  Surgeon: Wellington Hampshire, MD;  Location: [facility];  Service: Cardiovascular;  Laterality:  N/A;     Current Outpatient Medications  Medication Sig Dispense Refill  . amLODipine (NORVASC) 5 MG tablet Take 5 mg by mouth 2 (two) times daily.  11  . atorvastatin (LIPITOR) 80 MG tablet Take 1 tablet (80 mg total) by mouth daily at 6 PM. 30 tablet 0  . carvedilol (COREG) 3.125 MG tablet Take 1 tablet (3.125 mg total) by mouth 2 (two) times daily with a meal. 60 tablet 0  . galantamine (RAZADYNE) 8 MG tablet Take 8 mg by mouth 2 (two) times daily.  11  . pantoprazole (PROTONIX) 40 MG tablet Take 1 tablet (40 mg total) by mouth daily. 60 tablet 0  . tamsulosin (FLOMAX) 0.4 MG CAPS capsule Take 0.4 mg by mouth daily.    . ticagrelor (BRILINTA) 90 MG TABS tablet Take 1 tablet (90 mg total) by mouth 2 (two) times daily. 180 tablet 0   No current facility-administered medications for this visit.     Allergies:   Patient has no known allergies.    Social History:  The patient  reports that he has quit smoking. He has never used smokeless tobacco. He reports that he drinks alcohol. He reports that he does not use drugs.   Family History:  The patient's family history is not on file.    ROS:  Please see the history of present illness.   Otherwise, review of systems are positive for none.   All other systems are reviewed and negative.    PHYSICAL EXAM: VS:  BP 100/70 (BP Location: Left Arm, Patient  Position: Sitting, Cuff Size: Normal)   Pulse 62   Ht 5\' 8"  (1.727 m)   Wt 180 lb 8 oz (81.9 kg)   BMI 27.44 kg/m  , BMI Body mass index is 27.44 kg/m. GEN: Well nourished, well developed, in no acute distress  HEENT: normal  Neck: no JVD, carotid bruits, or masses Cardiac: RRR; no murmurs, rubs, or gallops,no edema  Respiratory:  clear to auscultation bilaterally, normal work of breathing GI: soft, nontender, nondistended, + BS MS: no deformity or atrophy  Skin: warm and dry, no rash Neuro:  Strength and sensation are intact Psych: euthymic mood, full affect Distal pulses are  normal bilaterally.  EKG:  EKG is ordered today. The ekg ordered today demonstrates sinus bradycardia with no significant ST or T wave changes.   Recent Labs: 05/28/2017: ALT 18 05/29/2017: Hemoglobin 13.5; Magnesium 1.9; Platelets 220 05/30/2017: BUN 24; Creatinine, Ser 1.99; Potassium 3.5; Sodium 138    Lipid Panel    Component Value Date/Time   CHOL 214 (H) 05/29/2017 0242   TRIG 82 05/29/2017 0242   HDL 52 05/29/2017 0242   CHOLHDL 4.1 05/29/2017 0242   VLDL 16 05/29/2017 0242   LDLCALC 146 (H) 05/29/2017 0242      Wt Readings from Last 3 Encounters:  10/16/17 180 lb 8 oz (81.9 kg)  08/30/17 185 lb (83.9 kg)  07/13/17 182 lb 8 oz (82.8 kg)       No flowsheet data found.    ASSESSMENT AND PLAN:  1.  Coronary artery disease involving native coronary arteries without angina: He is overall doing well with no anginal symptoms.   Continue treatment with Brilinta.  Aspirin was discontinued due to excessive bruising.  2.  Mild ischemic cardiomyopathy: Ejection fraction was 45-50%: Continue small dose carvedilol.  The dose cannot be increased due to bradycardia.  He is not on ACE inhibitor or ARB due to his stage IV chronic kidney disease and low blood pressure.  3.  Essential hypertension: Blood pressure is controlled on carvedilol.  4.  Hyperlipidemia: Continue high-dose atorvastatin.  Most recent lipid profile in March showed an LDL of 72.    Disposition:   FU with me in 6 months  Signed,  Kathlyn Sacramento, MD  10/16/2017 1:57 PM    [place]

## 2017-10-30 DIAGNOSIS — N184 Chronic kidney disease, stage 4 (severe): Secondary | ICD-10-CM | POA: Diagnosis not present

## 2017-10-30 DIAGNOSIS — N060 Isolated proteinuria with minor glomerular abnormality: Secondary | ICD-10-CM | POA: Diagnosis not present

## 2018-01-25 ENCOUNTER — Other Ambulatory Visit: Payer: Self-pay | Admitting: Physician Assistant

## 2018-02-25 DIAGNOSIS — N184 Chronic kidney disease, stage 4 (severe): Secondary | ICD-10-CM | POA: Diagnosis not present

## 2018-03-04 DIAGNOSIS — N184 Chronic kidney disease, stage 4 (severe): Secondary | ICD-10-CM | POA: Diagnosis not present

## 2018-03-04 DIAGNOSIS — N060 Isolated proteinuria with minor glomerular abnormality: Secondary | ICD-10-CM | POA: Diagnosis not present

## 2018-03-04 DIAGNOSIS — E782 Mixed hyperlipidemia: Secondary | ICD-10-CM | POA: Diagnosis not present

## 2018-03-04 DIAGNOSIS — Z125 Encounter for screening for malignant neoplasm of prostate: Secondary | ICD-10-CM | POA: Diagnosis not present

## 2018-07-22 DIAGNOSIS — N184 Chronic kidney disease, stage 4 (severe): Secondary | ICD-10-CM | POA: Diagnosis not present

## 2018-07-22 DIAGNOSIS — E782 Mixed hyperlipidemia: Secondary | ICD-10-CM | POA: Diagnosis not present

## 2018-07-22 DIAGNOSIS — Z125 Encounter for screening for malignant neoplasm of prostate: Secondary | ICD-10-CM | POA: Diagnosis not present

## 2018-07-22 DIAGNOSIS — N060 Isolated proteinuria with minor glomerular abnormality: Secondary | ICD-10-CM | POA: Diagnosis not present

## 2018-07-26 DIAGNOSIS — M25511 Pain in right shoulder: Secondary | ICD-10-CM | POA: Diagnosis not present

## 2018-07-26 DIAGNOSIS — M7581 Other shoulder lesions, right shoulder: Secondary | ICD-10-CM | POA: Diagnosis not present

## 2018-07-26 DIAGNOSIS — M7521 Bicipital tendinitis, right shoulder: Secondary | ICD-10-CM | POA: Diagnosis not present

## 2018-07-29 DIAGNOSIS — N060 Isolated proteinuria with minor glomerular abnormality: Secondary | ICD-10-CM | POA: Diagnosis not present

## 2018-07-29 DIAGNOSIS — F0150 Vascular dementia without behavioral disturbance: Secondary | ICD-10-CM | POA: Diagnosis not present

## 2018-07-29 DIAGNOSIS — E782 Mixed hyperlipidemia: Secondary | ICD-10-CM | POA: Diagnosis not present

## 2018-07-29 DIAGNOSIS — Z0000 Encounter for general adult medical examination without abnormal findings: Secondary | ICD-10-CM | POA: Diagnosis not present

## 2018-07-29 DIAGNOSIS — N184 Chronic kidney disease, stage 4 (severe): Secondary | ICD-10-CM | POA: Diagnosis not present

## 2018-09-23 DIAGNOSIS — Z9841 Cataract extraction status, right eye: Secondary | ICD-10-CM | POA: Diagnosis not present

## 2018-09-23 DIAGNOSIS — H52223 Regular astigmatism, bilateral: Secondary | ICD-10-CM | POA: Diagnosis not present

## 2018-09-23 DIAGNOSIS — Z9842 Cataract extraction status, left eye: Secondary | ICD-10-CM | POA: Diagnosis not present

## 2018-10-11 DIAGNOSIS — F0150 Vascular dementia without behavioral disturbance: Secondary | ICD-10-CM | POA: Diagnosis not present

## 2018-10-11 DIAGNOSIS — N184 Chronic kidney disease, stage 4 (severe): Secondary | ICD-10-CM | POA: Diagnosis not present

## 2018-10-11 DIAGNOSIS — E782 Mixed hyperlipidemia: Secondary | ICD-10-CM | POA: Diagnosis not present

## 2018-10-11 DIAGNOSIS — R5383 Other fatigue: Secondary | ICD-10-CM | POA: Diagnosis not present

## 2018-11-04 DIAGNOSIS — M109 Gout, unspecified: Secondary | ICD-10-CM | POA: Diagnosis not present

## 2018-11-04 DIAGNOSIS — F0150 Vascular dementia without behavioral disturbance: Secondary | ICD-10-CM | POA: Diagnosis not present

## 2018-11-04 DIAGNOSIS — N184 Chronic kidney disease, stage 4 (severe): Secondary | ICD-10-CM | POA: Diagnosis not present

## 2018-12-30 DIAGNOSIS — M898X9 Other specified disorders of bone, unspecified site: Secondary | ICD-10-CM | POA: Diagnosis not present

## 2018-12-30 DIAGNOSIS — M79675 Pain in left toe(s): Secondary | ICD-10-CM | POA: Diagnosis not present

## 2019-01-22 DIAGNOSIS — N060 Isolated proteinuria with minor glomerular abnormality: Secondary | ICD-10-CM | POA: Diagnosis not present

## 2019-01-29 DIAGNOSIS — E782 Mixed hyperlipidemia: Secondary | ICD-10-CM | POA: Diagnosis not present

## 2019-01-29 DIAGNOSIS — R801 Persistent proteinuria, unspecified: Secondary | ICD-10-CM | POA: Diagnosis not present

## 2019-01-29 DIAGNOSIS — N184 Chronic kidney disease, stage 4 (severe): Secondary | ICD-10-CM | POA: Diagnosis not present

## 2019-01-29 DIAGNOSIS — Z125 Encounter for screening for malignant neoplasm of prostate: Secondary | ICD-10-CM | POA: Diagnosis not present

## 2019-01-29 DIAGNOSIS — R768 Other specified abnormal immunological findings in serum: Secondary | ICD-10-CM | POA: Diagnosis not present

## 2019-01-30 ENCOUNTER — Other Ambulatory Visit: Payer: Self-pay | Admitting: Internal Medicine

## 2019-01-30 DIAGNOSIS — N184 Chronic kidney disease, stage 4 (severe): Secondary | ICD-10-CM

## 2019-01-30 DIAGNOSIS — E782 Mixed hyperlipidemia: Secondary | ICD-10-CM

## 2019-02-03 ENCOUNTER — Other Ambulatory Visit: Payer: Self-pay

## 2019-02-03 ENCOUNTER — Emergency Department: Payer: PPO

## 2019-02-03 ENCOUNTER — Emergency Department
Admission: EM | Admit: 2019-02-03 | Discharge: 2019-02-03 | Disposition: A | Discharge status: Home / Self Care | Payer: PPO | Attending: Student | Admitting: Student

## 2019-02-03 ENCOUNTER — Encounter: Payer: Self-pay | Admitting: Emergency Medicine

## 2019-02-03 DIAGNOSIS — N184 Chronic kidney disease, stage 4 (severe): Secondary | ICD-10-CM | POA: Insufficient documentation

## 2019-02-03 DIAGNOSIS — Z87891 Personal history of nicotine dependence: Secondary | ICD-10-CM | POA: Insufficient documentation

## 2019-02-03 DIAGNOSIS — Z79899 Other long term (current) drug therapy: Secondary | ICD-10-CM | POA: Diagnosis not present

## 2019-02-03 DIAGNOSIS — Z20828 Contact with and (suspected) exposure to other viral communicable diseases: Secondary | ICD-10-CM | POA: Diagnosis not present

## 2019-02-03 DIAGNOSIS — R079 Chest pain, unspecified: Secondary | ICD-10-CM | POA: Insufficient documentation

## 2019-02-03 DIAGNOSIS — R1111 Vomiting without nausea: Secondary | ICD-10-CM | POA: Diagnosis not present

## 2019-02-03 DIAGNOSIS — R1110 Vomiting, unspecified: Secondary | ICD-10-CM | POA: Diagnosis not present

## 2019-02-03 DIAGNOSIS — I129 Hypertensive chronic kidney disease with stage 1 through stage 4 chronic kidney disease, or unspecified chronic kidney disease: Secondary | ICD-10-CM | POA: Insufficient documentation

## 2019-02-03 DIAGNOSIS — R112 Nausea with vomiting, unspecified: Secondary | ICD-10-CM | POA: Diagnosis not present

## 2019-02-03 LAB — URINALYSIS, COMPLETE (UACMP) WITH MICROSCOPIC
Bacteria, UA: NONE SEEN
Bilirubin Urine: NEGATIVE
Glucose, UA: 50 mg/dL — AB
Hgb urine dipstick: NEGATIVE
Ketones, ur: 5 mg/dL — AB
Leukocytes,Ua: NEGATIVE
Nitrite: NEGATIVE
Protein, ur: 100 mg/dL — AB
Specific Gravity, Urine: 1.016 (ref 1.005–1.030)
pH: 7 (ref 5.0–8.0)

## 2019-02-03 LAB — HEPATIC FUNCTION PANEL
ALT: 14 U/L (ref 0–44)
AST: 18 U/L (ref 15–41)
Albumin: 3.4 g/dL — ABNORMAL LOW (ref 3.5–5.0)
Alkaline Phosphatase: 67 U/L (ref 38–126)
Bilirubin, Direct: <0.1 mg/dL (ref 0.0–0.2)
Total Bilirubin: 0.5 mg/dL (ref 0.3–1.2)
Total Protein: 6.5 g/dL (ref 6.5–8.1)

## 2019-02-03 LAB — CBC
HCT: 39 % (ref 39.0–52.0)
Hemoglobin: 13.2 g/dL (ref 13.0–17.0)
MCH: 30.6 pg (ref 26.0–34.0)
MCHC: 33.8 g/dL (ref 30.0–36.0)
MCV: 90.3 fL (ref 80.0–100.0)
Platelets: 231 10*3/uL (ref 150–400)
RBC: 4.32 MIL/uL (ref 4.22–5.81)
RDW: 12.3 % (ref 11.5–15.5)
WBC: 8.1 10*3/uL (ref 4.0–10.5)
nRBC: 0 % (ref 0.0–0.2)

## 2019-02-03 LAB — BASIC METABOLIC PANEL
Anion gap: 14 (ref 5–15)
BUN: 33 mg/dL — ABNORMAL HIGH (ref 8–23)
CO2: 19 mmol/L — ABNORMAL LOW (ref 22–32)
Calcium: 9.3 mg/dL (ref 8.9–10.3)
Chloride: 107 mmol/L (ref 98–111)
Creatinine, Ser: 2.56 mg/dL — ABNORMAL HIGH (ref 0.61–1.24)
GFR calc Af Amer: 27 mL/min — ABNORMAL LOW (ref >60)
GFR calc non Af Amer: 24 mL/min — ABNORMAL LOW (ref >60)
Glucose, Bld: 135 mg/dL — ABNORMAL HIGH (ref 70–99)
Potassium: 3.9 mmol/L (ref 3.5–5.1)
Sodium: 140 mmol/L (ref 135–145)

## 2019-02-03 LAB — TROPONIN I (HIGH SENSITIVITY)
Troponin I (High Sensitivity): 5 ng/L (ref <18)
Troponin I (High Sensitivity): 6 ng/L (ref <18)

## 2019-02-03 LAB — LIPASE, BLOOD: Lipase: 41 U/L (ref 11–51)

## 2019-02-03 MED ORDER — SODIUM CHLORIDE 0.9% FLUSH
3.0000 mL | Freq: Once | INTRAVENOUS | Status: AC
  Administered 2019-02-03: 16:00:00 3 mL via INTRAVENOUS

## 2019-02-03 MED ORDER — ONDANSETRON HCL 4 MG/2ML IJ SOLN
4.0000 mg | Freq: Once | INTRAMUSCULAR | Status: AC
  Administered 2019-02-03: 16:00:00 4 mg via INTRAVENOUS
  Filled 2019-02-03: qty 2

## 2019-02-03 MED ORDER — ONDANSETRON HCL 4 MG/2ML IJ SOLN
4.0000 mg | Freq: Once | INTRAMUSCULAR | Status: AC
  Administered 2019-02-03: 20:00:00 4 mg via INTRAVENOUS
  Filled 2019-02-03: qty 2

## 2019-02-03 MED ORDER — SODIUM CHLORIDE 0.9 % IV BOLUS
1000.0000 mL | Freq: Once | INTRAVENOUS | Status: AC
  Administered 2019-02-03: 20:00:00 1000 mL via INTRAVENOUS

## 2019-02-03 NOTE — ED Triage Notes (Signed)
Per wife patient having nausea and vomiting all today. Began chest pain approx 30 min prior to arrival.

## 2019-02-03 NOTE — ED Notes (Signed)
Pt given sandwich tray and ice water to drink at this time per verbal okay by Dr Joan Mayans.

## 2019-02-03 NOTE — ED Notes (Addendum)
Covid swab sent with white chart label d/t being locked out of collection manager because pt is currently being processed in lab. Lab notified and okay to send with chart label.

## 2019-02-03 NOTE — Discharge Instructions (Addendum)
Thank you for letting us take care of you in the emergency department today.  ° °Please continue to take any regular, prescribed medications.  ° °Please follow up with: °- Your primary care doctor to review your ER visit and follow up on your symptoms.  ° °Please return to the ER for any new or worsening symptoms.  ° °

## 2019-02-03 NOTE — ED Provider Notes (Signed)
[facility] Emergency Department Provider Note  ____________________________________________   First MD Initiated Contact with Patient 02/03/19 1829     (approximate)  I have reviewed the triage vital signs and the nursing notes.  History  Chief Complaint Chest Pain and Emesis    HPI Troy Chavez is a 73 y.o. male with history of dementia, CKD, HTN who presents for nausea, vomiting today, and a brief episode of chest pain, now resolved.   Wife at bedside states patient has been vomiting throughout the day, at least 4 episodes of NBNB emesis. He denies any associated abdominal pain, diarrhea, urinary frequency, or dysuria. No fevers, chills, shortness of breath, difficulty breathing, or sick contacts.   This afternoon during an episode of vomiting, he complained of some sharp, left sided chest pain. This lasted several seconds, and then spontaneously resolved.  In the emergency department, he denies any chest pain, nausea, or abdominal pain.  No recent travel, no new foods.  Wife does state that he is finishing up a course of Augmentin for a local area of cellulitis.   Past Medical Hx Past Medical History:  Diagnosis Date  . Alzheimer's disease ([place])   . CKD (chronic kidney disease), stage IV ([place])   . Hypertension     Problem List Patient Active Problem List   Diagnosis Date Noted  . Acute ST elevation myocardial infarction (STEMI) of lateral wall ([place]) 05/28/2017  . STEMI (ST elevation myocardial infarction) ([place]) 05/28/2017  . Chest pain 05/06/2017  . CKD (chronic kidney disease), stage IV ([place]) 05/06/2017  . HTN (hypertension) 05/06/2017  . Alzheimer disease ([place]) 05/06/2017  . GERD (gastroesophageal reflux disease) 05/06/2017  . Benign essential hypertension 05/26/2016  . Hyperlipidemia, mixed 07/06/2014    Past Surgical Hx Past Surgical History:  Procedure Laterality Date  . CORONARY/GRAFT ACUTE MI REVASCULARIZATION N/A 05/28/2017   Procedure: Coronary/Graft Acute MI Revascularization;  Surgeon: Wellington Hampshire, MD;  Location: [place] [facility];  Service: Cardiovascular;  Laterality: N/A;  . LEFT HEART CATH AND CORONARY ANGIOGRAPHY N/A 05/28/2017   Procedure: LEFT HEART CATH AND CORONARY ANGIOGRAPHY;  Surgeon: Wellington Hampshire, MD;  Location: [place] [facility];  Service: Cardiovascular;  Laterality: N/A;    Medications Prior to Admission medications   Medication Sig Start Date End Date Taking? Authorizing Provider  amLODipine (NORVASC) 5 MG tablet Take 5 mg by mouth 2 (two) times daily. 03/25/17   [provider]  atorvastatin (LIPITOR) 80 MG tablet Take 1 tablet (80 mg total) by mouth daily at 6 PM. 05/30/17   Vaughan Basta, MD  BRILINTA 90 MG TABS tablet TAKE 1 TABLET(90 MG) BY MOUTH TWICE DAILY 01/25/18   Wellington Hampshire, MD  carvedilol (COREG) 3.125 MG tablet Take 1 tablet (3.125 mg total) by mouth 2 (two) times daily with a meal. 05/30/17   Vaughan Basta, MD  galantamine (RAZADYNE) 8 MG tablet Take 8 mg by mouth 2 (two) times daily. 03/02/17   [provider]  pantoprazole (PROTONIX) 40 MG tablet Take 1 tablet (40 mg total) by mouth daily. 05/08/17   Max Sane, MD  tamsulosin (FLOMAX) 0.4 MG CAPS capsule Take 0.4 mg by mouth daily. 05/06/17   [provider]    Allergies Patient has no known allergies.  Family Hx No family history on file.  Social Hx Social History   Tobacco Use  . Smoking status: Former Research scientist (life sciences)  . Smokeless tobacco: Never Used  . Tobacco comment: Quit 35 yeaers  ago  Substance Use Topics  . Alcohol use: Yes  . Drug use: No     Review of Systems  Constitutional: Negative for fever, chills. Eyes: Negative for visual changes. ENT: Negative for sore throat. Cardiovascular: + for chest pain. Respiratory: Negative for shortness of breath. Gastrointestinal: + for nausea, vomiting.  Genitourinary: Negative for dysuria. Musculoskeletal:  Negative for leg swelling. Skin: Negative for rash. Neurological: Negative for for headaches.   Physical Exam  Vital Signs: ED Triage Vitals  Enc Vitals Group     BP 02/03/19 1531 (!) 146/100     Pulse Rate 02/03/19 1531 78     Resp 02/03/19 1531 20     Temp 02/03/19 1531 97.8 F (36.6 C)     Temp Source 02/03/19 1531 Oral     SpO2 02/03/19 1531 100 %     Weight 02/03/19 1533 165 lb (74.8 kg)     Height 02/03/19 1533 5\' 7"  (1.702 m)     Head Circumference --      Peak Flow --      Pain Score 02/03/19 1533 4     Pain Loc --      Pain Edu? --      Excl. in [place]? --     Constitutional: Alert and oriented.  Head: Normocephalic. Atraumatic. Eyes: Conjunctivae clear. Sclera anicteric. Nose: No congestion. No rhinorrhea. Mouth/Throat: Mucous membranes are slightly dry.  Neck: No stridor.   Cardiovascular: Normal rate, regular rhythm. No murmurs. Extremities well perfused. Respiratory: Normal respiratory effort.  Lungs CTAB. Gastrointestinal: Soft. Non-tender throughout to deep palpation. Non-distended.  Musculoskeletal: No lower extremity edema. No deformities. Neurologic:  Normal speech and language. No gross focal neurologic deficits are appreciated.  Skin: Skin is warm, dry and intact. No rash noted. Psychiatric: Mood and affect are appropriate for situation.  EKG  Personally reviewed.   Rate: 113 Rhythm: sinus Axis: leftward Intervals: WNl No acute ischemic changes No STEMI    Radiology  CXR: IMPRESSION: No active cardiopulmonary disease.  CT A/P: IMPRESSION: 1. Moderate bilateral symmetric perinephric stranding and cortical thinning, a nonspecific finding though may correlate with either age or decreased renal function though in the setting of acute symptoms, could consider urinalysis to exclude ascending urinary tract infection. 2. Small soft tissue extension along the anteroinferior bladder, possibly a small urachal remnant. 3. No urolithiasis or  hydronephrosis. No other acute abnormality in the abdomen or pelvis. 4. Aortic Atherosclerosis (ICD10-I70.0).    Procedures  Procedure(s) performed (including critical care):  Procedures   Initial Impression / Assessment and Plan / ED Course  73 y.o. male who presents to the ED for nausea, vomiting, and a very brief episode of chest pain, as above.  Ddx: viral GI process, COVID, pancreatitis, colitis, side effect from his course of Augmentin, atypical ACS  Plan: labs, fluids, symptom control, imaging and reassess  Labs reveal mild AKI, increase in his creatinine from baseline, 2.56 from 1.99 previously.  He is receiving IV fluids.  CT with nonspecific bilateral perinephric stranding, likely related to his decreased renal function, as his urinalysis is negative for infection.  Initial EKG tachycardic, but with no acute ischemic changes.  Heart rate improved with fluids.  High-sensitivity troponin negative.  Patient tolerated PO while in the emergency department and reports feeling improved.  As such, will plan for discharge.  They understand that the COVID swab will results in 1 to 2 days.  Advised PCP follow-up and given return precautions.   Final Clinical Impression(s) /  ED Diagnosis  Final diagnoses:  Vomiting in adult       Note:  This document was prepared using Dragon voice recognition software and may include unintentional dictation errors.   Lilia Pro., MD 02/03/19 [phone]

## 2019-02-04 ENCOUNTER — Inpatient Hospital Stay: Payer: PPO

## 2019-02-04 ENCOUNTER — Inpatient Hospital Stay: Payer: PPO | Attending: Oncology | Admitting: Oncology

## 2019-02-04 ENCOUNTER — Other Ambulatory Visit: Payer: Self-pay

## 2019-02-04 ENCOUNTER — Encounter: Payer: Self-pay | Admitting: Oncology

## 2019-02-04 VITALS — BP 149/90 | HR 68 | Temp 98.6°F | Resp 18 | Wt 183.1 lb

## 2019-02-04 DIAGNOSIS — R768 Other specified abnormal immunological findings in serum: Secondary | ICD-10-CM | POA: Diagnosis not present

## 2019-02-04 DIAGNOSIS — R5383 Other fatigue: Secondary | ICD-10-CM | POA: Diagnosis not present

## 2019-02-04 DIAGNOSIS — R809 Proteinuria, unspecified: Secondary | ICD-10-CM | POA: Insufficient documentation

## 2019-02-04 DIAGNOSIS — G309 Alzheimer's disease, unspecified: Secondary | ICD-10-CM | POA: Insufficient documentation

## 2019-02-04 DIAGNOSIS — N184 Chronic kidney disease, stage 4 (severe): Secondary | ICD-10-CM

## 2019-02-04 DIAGNOSIS — M1990 Unspecified osteoarthritis, unspecified site: Secondary | ICD-10-CM | POA: Diagnosis not present

## 2019-02-04 DIAGNOSIS — Z87891 Personal history of nicotine dependence: Secondary | ICD-10-CM | POA: Insufficient documentation

## 2019-02-04 DIAGNOSIS — R5381 Other malaise: Secondary | ICD-10-CM | POA: Diagnosis not present

## 2019-02-04 DIAGNOSIS — I129 Hypertensive chronic kidney disease with stage 1 through stage 4 chronic kidney disease, or unspecified chronic kidney disease: Secondary | ICD-10-CM | POA: Diagnosis not present

## 2019-02-04 DIAGNOSIS — Z79899 Other long term (current) drug therapy: Secondary | ICD-10-CM | POA: Insufficient documentation

## 2019-02-04 DIAGNOSIS — I2510 Atherosclerotic heart disease of native coronary artery without angina pectoris: Secondary | ICD-10-CM | POA: Insufficient documentation

## 2019-02-04 LAB — COMPREHENSIVE METABOLIC PANEL
ALT: 14 U/L (ref 0–44)
AST: 19 U/L (ref 15–41)
Albumin: 3.7 g/dL (ref 3.5–5.0)
Alkaline Phosphatase: 73 U/L (ref 38–126)
Anion gap: 9 (ref 5–15)
BUN: 31 mg/dL — ABNORMAL HIGH (ref 8–23)
CO2: 27 mmol/L (ref 22–32)
Calcium: 9 mg/dL (ref 8.9–10.3)
Chloride: 107 mmol/L (ref 98–111)
Creatinine, Ser: 2.77 mg/dL — ABNORMAL HIGH (ref 0.61–1.24)
GFR calc Af Amer: 25 mL/min — ABNORMAL LOW (ref >60)
GFR calc non Af Amer: 22 mL/min — ABNORMAL LOW (ref >60)
Glucose, Bld: 102 mg/dL — ABNORMAL HIGH (ref 70–99)
Potassium: 4.2 mmol/L (ref 3.5–5.1)
Sodium: 143 mmol/L (ref 135–145)
Total Bilirubin: 0.5 mg/dL (ref 0.3–1.2)
Total Protein: 7.2 g/dL (ref 6.5–8.1)

## 2019-02-04 LAB — CBC WITH DIFFERENTIAL/PLATELET
Abs Immature Granulocytes: 0.03 10*3/uL (ref 0.00–0.07)
Basophils Absolute: 0.1 10*3/uL (ref 0.0–0.1)
Basophils Relative: 1 %
Eosinophils Absolute: 0.2 10*3/uL (ref 0.0–0.5)
Eosinophils Relative: 2 %
HCT: 37.2 % — ABNORMAL LOW (ref 39.0–52.0)
Hemoglobin: 12.3 g/dL — ABNORMAL LOW (ref 13.0–17.0)
Immature Granulocytes: 0 %
Lymphocytes Relative: 11 %
Lymphs Abs: 0.9 10*3/uL (ref 0.7–4.0)
MCH: 30.8 pg (ref 26.0–34.0)
MCHC: 33.1 g/dL (ref 30.0–36.0)
MCV: 93 fL (ref 80.0–100.0)
Monocytes Absolute: 0.9 10*3/uL (ref 0.1–1.0)
Monocytes Relative: 11 %
Neutro Abs: 5.9 10*3/uL (ref 1.7–7.7)
Neutrophils Relative %: 75 %
Platelets: 239 10*3/uL (ref 150–400)
RBC: 4 MIL/uL — ABNORMAL LOW (ref 4.22–5.81)
RDW: 12.6 % (ref 11.5–15.5)
WBC: 7.8 10*3/uL (ref 4.0–10.5)
nRBC: 0 % (ref 0.0–0.2)

## 2019-02-05 DIAGNOSIS — R801 Persistent proteinuria, unspecified: Secondary | ICD-10-CM | POA: Diagnosis not present

## 2019-02-05 DIAGNOSIS — R112 Nausea with vomiting, unspecified: Secondary | ICD-10-CM | POA: Diagnosis not present

## 2019-02-05 DIAGNOSIS — Z23 Encounter for immunization: Secondary | ICD-10-CM | POA: Diagnosis not present

## 2019-02-05 LAB — KAPPA/LAMBDA LIGHT CHAINS
Kappa free light chain: 62.2 mg/L — ABNORMAL HIGH (ref 3.3–19.4)
Kappa, lambda light chain ratio: 1.63 (ref 0.26–1.65)
Lambda free light chains: 38.2 mg/L — ABNORMAL HIGH (ref 5.7–26.3)

## 2019-02-05 LAB — NOVEL CORONAVIRUS, NAA (HOSP ORDER, SEND-OUT TO REF LAB; TAT 18-24 HRS): SARS-CoV-2, NAA: NOT DETECTED

## 2019-02-05 LAB — URINE CULTURE: Culture: NO GROWTH

## 2019-02-07 ENCOUNTER — Other Ambulatory Visit: Payer: Self-pay

## 2019-02-07 DIAGNOSIS — R768 Other specified abnormal immunological findings in serum: Secondary | ICD-10-CM

## 2019-02-10 ENCOUNTER — Other Ambulatory Visit: Payer: Self-pay

## 2019-02-10 ENCOUNTER — Ambulatory Visit
Admission: RE | Admit: 2019-02-10 | Discharge: 2019-02-10 | Disposition: A | Discharge status: Home / Self Care | Payer: PPO | Source: Ambulatory Visit | Attending: Internal Medicine | Admitting: Internal Medicine

## 2019-02-10 ENCOUNTER — Inpatient Hospital Stay: Payer: PPO | Attending: Oncology | Admitting: Oncology

## 2019-02-10 ENCOUNTER — Encounter: Payer: Self-pay | Admitting: Oncology

## 2019-02-10 DIAGNOSIS — E782 Mixed hyperlipidemia: Secondary | ICD-10-CM | POA: Diagnosis not present

## 2019-02-10 DIAGNOSIS — R768 Other specified abnormal immunological findings in serum: Secondary | ICD-10-CM | POA: Diagnosis not present

## 2019-02-10 DIAGNOSIS — N184 Chronic kidney disease, stage 4 (severe): Secondary | ICD-10-CM | POA: Insufficient documentation

## 2019-02-10 DIAGNOSIS — N189 Chronic kidney disease, unspecified: Secondary | ICD-10-CM | POA: Diagnosis not present

## 2019-02-10 LAB — IFE+PROTEIN ELECTRO, 24-HR UR
% BETA, Urine: 15.4 %
ALPHA 1 URINE: 7 %
Albumin, U: 59.7 %
Alpha 2, Urine: 6.2 %
GAMMA GLOBULIN URINE: 11.7 %
Total Protein, Urine-Ur/day: 5485 mg/24 hr — ABNORMAL HIGH (ref 30–150)
Total Protein, Urine: 261.2 mg/dL
Total Volume: 2100

## 2019-02-10 NOTE — Progress Notes (Signed)
I connected with Troy Chavez on 02/10/19 at  8:30 AM EDT by video enabled telemedicine visit and verified that I am speaking with the correct person using two identifiers.  Patient is a smart phone and repeated attempts were made to establish video connection which did not work out and visit had to be switched to a telephone call   I discussed the limitations, risks, security and privacy concerns of performing an evaluation and management service by telemedicine and the availability of in-person appointments. I also discussed with the patient that there may be a patient responsible charge related to this service. The patient expressed understanding and agreed to proceed.  Other persons participating in the visit and their role in the encounter:  Patients wife  Patient's location:  home Provider's location:  work   Diagnosis-elevated free light chains likely secondary to CKD  Chief complaint/ Reason for visit-discuss results of blood work  Heme/Onc history: Patient is a 73 year old male with a history of coronary artery disease, hypertension, osteoarthritis as well as CKD.  He is known to have proteinuria since 2015.  He was noted to have gradually worsening creatinine his baseline creatinine is between 1.8-2 at least dating back to 2016.  More recently it has gone up to 3.  CBC showed white count of 6.7, H&H of 12.6/38.5 and a platelet count of 211.  He was found to have elevated kappa light chain of 57.3 and lambda light chain of 38.9 with a normal kappa lambda light chain ratio of 1.4.  CMP showed normal calcium as well as normal total protein.  He has been found to have consistent proteinuria at least dating back to 2015.  Blood work from 02/04/2019 showed H&H of 12.3/37.2.  CMP showed an elevated creatinine of 2.77.  Calcium was normal at 9.  Both kappa and lambda light chains were elevated at 62 and 38 respectively with a normal free light chain ratio 1.6.  24-hour urine protein  electrophoresis is currently pending   Interval history denies any specific complaints today   Review of Systems  Constitutional: Negative for chills, fever, malaise/fatigue and weight loss.  HENT: Negative for congestion, ear discharge and nosebleeds.   Eyes: Negative for blurred vision.  Respiratory: Negative for cough, hemoptysis, sputum production, shortness of breath and wheezing.   Cardiovascular: Negative for chest pain, palpitations, orthopnea and claudication.  Gastrointestinal: Negative for abdominal pain, blood in stool, constipation, diarrhea, heartburn, melena, nausea and vomiting.  Genitourinary: Negative for dysuria, flank pain, frequency, hematuria and urgency.  Musculoskeletal: Negative for back pain, joint pain and myalgias.  Skin: Negative for rash.  Neurological: Negative for dizziness, tingling, focal weakness, seizures, weakness and headaches.  Endo/Heme/Allergies: Does not bruise/bleed easily.  Psychiatric/Behavioral: Negative for depression and suicidal ideas. The patient does not have insomnia.     No Known Allergies  Past Medical History:  Diagnosis Date  . Alzheimer's disease ([place])   . CKD (chronic kidney disease), stage IV ([place])   . Hypertension     Past Surgical History:  Procedure Laterality Date  . CORONARY/GRAFT ACUTE MI REVASCULARIZATION N/A 05/28/2017   Procedure: Coronary/Graft Acute MI Revascularization;  Surgeon: Wellington Hampshire, MD;  Location: [place] [facility];  Service: Cardiovascular;  Laterality: N/A;  . LEFT HEART CATH AND CORONARY ANGIOGRAPHY N/A 05/28/2017   Procedure: LEFT HEART CATH AND CORONARY ANGIOGRAPHY;  Surgeon: Wellington Hampshire, MD;  Location: [place] [facility];  Service: Cardiovascular;  Laterality: N/A;    Social History  Socioeconomic History  . Marital status: Married    Spouse name: Not on file  . Number of children: Not on file  . Years of education: Not on file  . Highest education level: Not on file   Occupational History  . Not on file  Social Needs  . Financial resource strain: Not on file  . Food insecurity    Worry: Not on file    Inability: Not on file  . Transportation needs    Medical: Not on file    Non-medical: Not on file  Tobacco Use  . Smoking status: Former Research scientist (life sciences)  . Smokeless tobacco: Never Used  . Tobacco comment: Quit 35 yeaers ago  Substance and Sexual Activity  . Alcohol use: Yes  . Drug use: No  . Sexual activity: Not on file  Lifestyle  . Physical activity    Days per week: Not on file    Minutes per session: Not on file  . Stress: Not on file  Relationships  . Social Herbalist on phone: Not on file    Gets together: Not on file    Attends religious service: Not on file    Active member of club or organization: Not on file    Attends meetings of clubs or organizations: Not on file    Relationship status: Not on file  . Intimate partner violence    Fear of current or ex partner: Not on file    Emotionally abused: Not on file    Physically abused: Not on file    Forced sexual activity: Not on file  Other Topics Concern  . Not on file  Social History Narrative  . Not on file    Family History  Problem Relation Age of Onset  . Dementia Mother   . Rheum arthritis Father      Current Outpatient Medications:  .  amLODipine (NORVASC) 5 MG tablet, Take 5 mg by mouth 2 (two) times daily., Disp: , Rfl: 11 .  aspirin EC 81 MG tablet, Take by mouth., Disp: , Rfl:  .  atorvastatin (LIPITOR) 80 MG tablet, Take 1 tablet (80 mg total) by mouth daily at 6 PM., Disp: 30 tablet, Rfl: 0 .  colchicine 0.6 MG tablet, Take 0.6 mg by mouth daily as needed. , Disp: , Rfl:  .  meclizine (ANTIVERT) 25 MG tablet, Take 25 mg by mouth 3 (three) times daily as needed for dizziness., Disp: , Rfl:  .  pantoprazole (PROTONIX) 40 MG tablet, Take 1 tablet (40 mg total) by mouth daily., Disp: 60 tablet, Rfl: 0 .  rivastigmine (EXELON) 9.5 mg/24hr, Place onto the  skin., Disp: , Rfl:  .  SUMAtriptan (IMITREX) 50 MG tablet, Take 50 mg by mouth every 2 (two) hours as needed for migraine. , Disp: , Rfl:  .  tamsulosin (FLOMAX) 0.4 MG CAPS capsule, Take 0.4 mg by mouth daily., Disp: , Rfl:   Ct Abdomen Pelvis Wo Contrast  Result Date: 02/03/2019 CLINICAL DATA:  Nausea, vomiting EXAM: CT ABDOMEN AND PELVIS WITHOUT CONTRAST TECHNIQUE: Multidetector CT imaging of the abdomen and pelvis was performed following the standard protocol without IV contrast. COMPARISON:  Lumbar radiographs 03/08/2011 FINDINGS: Lower chest: Lung bases are clear. Normal heart size. No pericardial effusion. Hepatobiliary: No focal liver abnormality is seen. No gallstones, gallbladder wall thickening, or biliary dilatation. Pancreas: Unremarkable. No pancreatic ductal dilatation or surrounding inflammatory changes. Spleen: Normal in size without focal abnormality. Adrenals/Urinary Tract: Normal adrenal glands. Moderate  bilateral symmetric perinephric stranding and cortical thinning, a nonspecific finding though may correlate with either age or decreased renal function. Kidneys are otherwise unremarkable, without renal calculi, suspicious lesion, or hydronephrosis. Soft tissue extension of the right anterolateral bladder, possible urachal remnant extending anteriorly. Stomach/Bowel: Distal esophagus, stomach and duodenal sweep are unremarkable. No bowel wall thickening or dilatation. No evidence of obstruction. A normal appendix is visualized. Scattered colonic diverticula without focal pericolonic inflammation to suggest diverticulitis. No colonic dilatation or wall thickening. Vascular/Lymphatic: Atherosclerotic plaque within the normal caliber aorta. No aneurysm or ectasia. No suspicious or enlarged lymph nodes in the included lymphatic chains. Reproductive: Borderline prostatomegaly with few coarse eccentric calcification the prostate. Seminal vesicles are unremarkable. Other: No abdominopelvic free  fluid or free gas. No bowel containing hernias. Bilateral fat containing inguinal hernias, right greater than left, small umbilical fat containing hernia as well Musculoskeletal: Multilevel degenerative changes are present in the imaged portions of the spine. Multilevel Schmorl's node formations are noted with endplate deformities similar in configuration to comparison radiographs from 20/12 IMPRESSION: 1. Moderate bilateral symmetric perinephric stranding and cortical thinning, a nonspecific finding though may correlate with either age or decreased renal function though in the setting of acute symptoms, could consider urinalysis to exclude ascending urinary tract infection. 2. Small soft tissue extension along the anteroinferior bladder, possibly a small urachal remnant. 3. No urolithiasis or hydronephrosis. No other acute abnormality in the abdomen or pelvis. 4. Aortic Atherosclerosis (ICD10-I70.0). Electronically Signed   By: Lovena Le M.D.   On: 02/03/2019 19:30   Dg Chest 2 View  Result Date: 02/03/2019 CLINICAL DATA:  Chest pain with nausea and vomiting today. EXAM: CHEST - 2 VIEW COMPARISON:  05/06/2017 FINDINGS: Lungs are adequately inflated without consolidation or effusion. Cardiomediastinal silhouette and remainder the exam is unchanged. IMPRESSION: No active cardiopulmonary disease. Electronically Signed   By: Marin Olp M.D.   On: 02/03/2019 16:01    No images are attached to the encounter.   CMP Latest Ref Rng & Units 02/04/2019  Glucose 70 - 99 mg/dL 102(H)  BUN 8 - 23 mg/dL 31(H)  Creatinine 0.61 - 1.24 mg/dL 2.77(H)  Sodium 135 - 145 mmol/L 143  Potassium 3.5 - 5.1 mmol/L 4.2  Chloride 98 - 111 mmol/L 107  CO2 22 - 32 mmol/L 27  Calcium 8.9 - 10.3 mg/dL 9.0  Total Protein 6.5 - 8.1 g/dL 7.2  Total Bilirubin 0.3 - 1.2 mg/dL 0.5  Alkaline Phos 38 - 126 U/L 73  AST 15 - 41 U/L 19  ALT 0 - 44 U/L 14   CBC Latest Ref Rng & Units 02/04/2019  WBC 4.0 - 10.5 K/uL 7.8   Hemoglobin 13.0 - 17.0 g/dL 12.3(L)  Hematocrit 39.0 - 52.0 % 37.2(L)  Platelets 150 - 400 K/uL 239     Assessment and plan: Patient is a 73 year old male referred for elevated free light chains in the setting of CKD Again I explained to the patient that both kappa and lambda light chains can be elevated in CKD.  He has a normal free light chain ratio, no M protein detected on myeloma panel, no anemia or hypercalcemia.  Total protein is normal.  I therefore do not suspect that he has a plasma cell dyscrasia.  24-hour urine protein electrophoresis is currently pending.  He will be seeing Dr. Candiss Norse from nephrology on 02/20/2019.  If urine protein electrophoresis does not reveal any evidence of M protein I will hold off on further investigation such as  a bone marrow biopsy at this time.  If there is a concern for worsening renal functions then he would need a kidney biopsy.  Follow-up instructions: I will see him back in 3 months with labs in a week prior  I discussed the assessment and treatment plan with the patient. The patient was provided an opportunity to ask questions and all were answered. The patient agreed with the plan and demonstrated an understanding of the instructions.   The patient was advised to call back or seek an in-person evaluation if the symptoms worsen or if the condition fails to improve as anticipated.   Visit Diagnosis: 1. Elevated serum immunoglobulin free light chain level     Dr. Randa Evens, MD, MPH [facility] [phone] 02/10/2019 9:48 AM\

## 2019-02-10 NOTE — Progress Notes (Signed)
Hematology/Oncology Consult note [facility] Telephone:([phone] [phone]  Patient Care Team: Rusty Aus, MD as PCP - General (Internal Medicine)   Name of the patient: Troy Chavez  [id number]  March 20, 1946    Reason for referral-elevated kappa and lambda free light chains   Referring physician-Dr. Emily Filbert  Date of visit: 02/10/19   History of presenting illness- Patient is a 73 year old male with a history of coronary artery disease, hypertension, osteoarthritis as well as CKD.  He is known to have proteinuria since 2015.  He was noted to have gradually worsening creatinine his baseline creatinine is between 1.8-2 at least dating back to 2016.  More recently it has gone up to 3.  CBC showed white count of 6.7, H&H of 12.6/38.5 and a platelet count of 211.  He was found to have elevated kappa light chain of 57.3 and lambda light chain of 38.9 with a normal kappa lambda light chain ratio of 1.4.  CMP showed normal calcium as well as normal total protein.  He has been found to have consistent proteinuria at least dating back to 2015.  Patient overall reports feeling well.  Appetite is good and he denies any unintentional weight loss.  Denies any new aches or pains anywhere.  Reports mild fatigue  ECOG PS- 1  Pain scale- 0   Review of systems- Review of Systems  Constitutional: Positive for malaise/fatigue. Negative for chills, fever and weight loss.  HENT: Negative for congestion, ear discharge and nosebleeds.   Eyes: Negative for blurred vision.  Respiratory: Negative for cough, hemoptysis, sputum production, shortness of breath and wheezing.   Cardiovascular: Negative for chest pain, palpitations, orthopnea and claudication.  Gastrointestinal: Negative for abdominal pain, blood in stool, constipation, diarrhea, heartburn, melena, nausea and vomiting.  Genitourinary: Negative for dysuria, flank pain, frequency, hematuria and urgency.   Musculoskeletal: Negative for back pain, joint pain and myalgias.  Skin: Negative for rash.  Neurological: Negative for dizziness, tingling, focal weakness, seizures, weakness and headaches.  Endo/Heme/Allergies: Does not bruise/bleed easily.  Psychiatric/Behavioral: Negative for depression and suicidal ideas. The patient does not have insomnia.     No Known Allergies  Patient Active Problem List   Diagnosis Date Noted  . Acute ST elevation myocardial infarction (STEMI) of lateral wall ([place]) 05/28/2017  . STEMI (ST elevation myocardial infarction) ([place]) 05/28/2017  . Chest pain 05/06/2017  . CKD (chronic kidney disease), stage IV ([place]) 05/06/2017  . HTN (hypertension) 05/06/2017  . Alzheimer disease ([place]) 05/06/2017  . GERD (gastroesophageal reflux disease) 05/06/2017  . Benign essential hypertension 05/26/2016  . Hyperlipidemia, mixed 07/06/2014     Past Medical History:  Diagnosis Date  . Alzheimer's disease ([place])   . CKD (chronic kidney disease), stage IV ([place])   . Hypertension      Past Surgical History:  Procedure Laterality Date  . CORONARY/GRAFT ACUTE MI REVASCULARIZATION N/A 05/28/2017   Procedure: Coronary/Graft Acute MI Revascularization;  Surgeon: Wellington Hampshire, MD;  Location: [place] [facility];  Service: Cardiovascular;  Laterality: N/A;  . LEFT HEART CATH AND CORONARY ANGIOGRAPHY N/A 05/28/2017   Procedure: LEFT HEART CATH AND CORONARY ANGIOGRAPHY;  Surgeon: Wellington Hampshire, MD;  Location: [place] [facility];  Service: Cardiovascular;  Laterality: N/A;    Social History   Socioeconomic History  . Marital status: Married    Spouse name: Not on file  . Number of children: Not on file  . Years of education: Not on file  .  Highest education level: Not on file  Occupational History  . Not on file  Social Needs  . Financial resource strain: Not on file  . Food insecurity    Worry: Not on file    Inability: Not on file  . Transportation needs     Medical: Not on file    Non-medical: Not on file  Tobacco Use  . Smoking status: Former Research scientist (life sciences)  . Smokeless tobacco: Never Used  . Tobacco comment: Quit 35 yeaers ago  Substance and Sexual Activity  . Alcohol use: Yes  . Drug use: No  . Sexual activity: Not on file  Lifestyle  . Physical activity    Days per week: Not on file    Minutes per session: Not on file  . Stress: Not on file  Relationships  . Social Herbalist on phone: Not on file    Gets together: Not on file    Attends religious service: Not on file    Active member of club or organization: Not on file    Attends meetings of clubs or organizations: Not on file    Relationship status: Not on file  . Intimate partner violence    Fear of current or ex partner: Not on file    Emotionally abused: Not on file    Physically abused: Not on file    Forced sexual activity: Not on file  Other Topics Concern  . Not on file  Social History Narrative  . Not on file     Family History  Problem Relation Age of Onset  . Dementia Mother   . Rheum arthritis Father      Current Outpatient Medications:  .  amLODipine (NORVASC) 5 MG tablet, Take 5 mg by mouth 2 (two) times daily., Disp: , Rfl: 11 .  aspirin EC 81 MG tablet, Take by mouth., Disp: , Rfl:  .  atorvastatin (LIPITOR) 80 MG tablet, Take 1 tablet (80 mg total) by mouth daily at 6 PM., Disp: 30 tablet, Rfl: 0 .  BRILINTA 90 MG TABS tablet, TAKE 1 TABLET(90 MG) BY MOUTH TWICE DAILY, Disp: 180 tablet, Rfl: 3 .  carvedilol (COREG) 3.125 MG tablet, Take 1 tablet (3.125 mg total) by mouth 2 (two) times daily with a meal., Disp: 60 tablet, Rfl: 0 .  colchicine 0.6 MG tablet, Take by mouth., Disp: , Rfl:  .  galantamine (RAZADYNE) 8 MG tablet, Take 8 mg by mouth 2 (two) times daily., Disp: , Rfl: 11 .  pantoprazole (PROTONIX) 40 MG tablet, Take 1 tablet (40 mg total) by mouth daily., Disp: 60 tablet, Rfl: 0 .  rivastigmine (EXELON) 9.5 mg/24hr, Place onto the  skin., Disp: , Rfl:  .  SUMAtriptan (IMITREX) 50 MG tablet, , Disp: , Rfl:  .  tamsulosin (FLOMAX) 0.4 MG CAPS capsule, Take 0.4 mg by mouth daily., Disp: , Rfl:    Physical exam:  Vitals:   02/04/19 1328  BP: (!) 149/90  Pulse: 68  Resp: 18  Temp: 98.6 F (37 C)  TempSrc: Tympanic  SpO2: 99%  Weight: 183 lb 1.6 oz (83.1 kg)   Physical Exam Constitutional:      General: He is not in acute distress. HENT:     Head: Normocephalic and atraumatic.  Eyes:     Pupils: Pupils are equal, round, and reactive to light.  Neck:     Musculoskeletal: Normal range of motion.  Cardiovascular:     Rate and Rhythm: Normal rate and  regular rhythm.     Heart sounds: Normal heart sounds.  Pulmonary:     Effort: Pulmonary effort is normal.     Breath sounds: Normal breath sounds.  Abdominal:     General: Bowel sounds are normal.     Palpations: Abdomen is soft.  Skin:    General: Skin is warm and dry.  Neurological:     Mental Status: He is alert and oriented to person, place, and time.        CMP Latest Ref Rng & Units 02/04/2019  Glucose 70 - 99 mg/dL 102(H)  BUN 8 - 23 mg/dL 31(H)  Creatinine 0.61 - 1.24 mg/dL 2.77(H)  Sodium 135 - 145 mmol/L 143  Potassium 3.5 - 5.1 mmol/L 4.2  Chloride 98 - 111 mmol/L 107  CO2 22 - 32 mmol/L 27  Calcium 8.9 - 10.3 mg/dL 9.0  Total Protein 6.5 - 8.1 g/dL 7.2  Total Bilirubin 0.3 - 1.2 mg/dL 0.5  Alkaline Phos 38 - 126 U/L 73  AST 15 - 41 U/L 19  ALT 0 - 44 U/L 14   CBC Latest Ref Rng & Units 02/04/2019  WBC 4.0 - 10.5 K/uL 7.8  Hemoglobin 13.0 - 17.0 g/dL 12.3(L)  Hematocrit 39.0 - 52.0 % 37.2(L)  Platelets 150 - 400 K/uL 239    No images are attached to the encounter.  Ct Abdomen Pelvis Wo Contrast  Result Date: 02/03/2019 CLINICAL DATA:  Nausea, vomiting EXAM: CT ABDOMEN AND PELVIS WITHOUT CONTRAST TECHNIQUE: Multidetector CT imaging of the abdomen and pelvis was performed following the standard protocol without IV contrast.  COMPARISON:  Lumbar radiographs 03/08/2011 FINDINGS: Lower chest: Lung bases are clear. Normal heart size. No pericardial effusion. Hepatobiliary: No focal liver abnormality is seen. No gallstones, gallbladder wall thickening, or biliary dilatation. Pancreas: Unremarkable. No pancreatic ductal dilatation or surrounding inflammatory changes. Spleen: Normal in size without focal abnormality. Adrenals/Urinary Tract: Normal adrenal glands. Moderate bilateral symmetric perinephric stranding and cortical thinning, a nonspecific finding though may correlate with either age or decreased renal function. Kidneys are otherwise unremarkable, without renal calculi, suspicious lesion, or hydronephrosis. Soft tissue extension of the right anterolateral bladder, possible urachal remnant extending anteriorly. Stomach/Bowel: Distal esophagus, stomach and duodenal sweep are unremarkable. No bowel wall thickening or dilatation. No evidence of obstruction. A normal appendix is visualized. Scattered colonic diverticula without focal pericolonic inflammation to suggest diverticulitis. No colonic dilatation or wall thickening. Vascular/Lymphatic: Atherosclerotic plaque within the normal caliber aorta. No aneurysm or ectasia. No suspicious or enlarged lymph nodes in the included lymphatic chains. Reproductive: Borderline prostatomegaly with few coarse eccentric calcification the prostate. Seminal vesicles are unremarkable. Other: No abdominopelvic free fluid or free gas. No bowel containing hernias. Bilateral fat containing inguinal hernias, right greater than left, small umbilical fat containing hernia as well Musculoskeletal: Multilevel degenerative changes are present in the imaged portions of the spine. Multilevel Schmorl's node formations are noted with endplate deformities similar in configuration to comparison radiographs from 20/12 IMPRESSION: 1. Moderate bilateral symmetric perinephric stranding and cortical thinning, a nonspecific  finding though may correlate with either age or decreased renal function though in the setting of acute symptoms, could consider urinalysis to exclude ascending urinary tract infection. 2. Small soft tissue extension along the anteroinferior bladder, possibly a small urachal remnant. 3. No urolithiasis or hydronephrosis. No other acute abnormality in the abdomen or pelvis. 4. Aortic Atherosclerosis (ICD10-I70.0). Electronically Signed   By: Lovena Le M.D.   On: 02/03/2019 19:30   Dg Chest  2 View  Result Date: 02/03/2019 CLINICAL DATA:  Chest pain with nausea and vomiting today. EXAM: CHEST - 2 VIEW COMPARISON:  05/06/2017 FINDINGS: Lungs are adequately inflated without consolidation or effusion. Cardiomediastinal silhouette and remainder the exam is unchanged. IMPRESSION: No active cardiopulmonary disease. Electronically Signed   By: Marin Olp M.D.   On: 02/03/2019 16:01    Assessment and plan- Patient is a 73 y.o. male with CKD and proteinuria referred for elevated free light chains  Patient was found to have elevated both kappa and lambda light chains with a normal free light chain ratio.  I suspect this is secondary to his CKD and not because of CKD.  He does not have elevated total protein.  He is not anemic no hypercalcemia.  He also had a myeloma panel which did not reveal any evidence of M protein.  Today I will check a CBC with differential, CMP, urine protein electrophoresis and a 24-hour sample as well as repeat serum free light chains.  I will see him back in 10 days time to discuss the results of his blood work.  Discussed that presently he does not have any evidence of MGUS or overt multiple myeloma.  Discussed natural history of MGUS and risk of progression to multiple myeloma.  I explained everything to his wife as well.  If the results of multiple myeloma work-up are nondiagnostic, I would recommend nephrology to consider kidney biopsy if the cause of renal failure is uncertain    Thank you for this kind referral and the opportunity to participate in the care of this patient   Visit Diagnosis 1. Elevated serum immunoglobulin free light chain level     Dr. Randa Evens, MD, MPH [facility] [phone] 02/10/2019  8:31 AM

## 2019-02-10 NOTE — Progress Notes (Signed)
Pt to discuss lab results today. Pt has no pain

## 2019-02-18 DIAGNOSIS — R808 Other proteinuria: Secondary | ICD-10-CM | POA: Diagnosis not present

## 2019-02-18 DIAGNOSIS — N184 Chronic kidney disease, stage 4 (severe): Secondary | ICD-10-CM | POA: Diagnosis not present

## 2019-02-18 DIAGNOSIS — I129 Hypertensive chronic kidney disease with stage 1 through stage 4 chronic kidney disease, or unspecified chronic kidney disease: Secondary | ICD-10-CM | POA: Diagnosis not present

## 2019-02-21 ENCOUNTER — Other Ambulatory Visit: Payer: Self-pay | Admitting: Nephrology

## 2019-02-21 ENCOUNTER — Other Ambulatory Visit: Payer: Self-pay | Admitting: Internal Medicine

## 2019-02-21 DIAGNOSIS — R808 Other proteinuria: Secondary | ICD-10-CM

## 2019-02-21 DIAGNOSIS — N184 Chronic kidney disease, stage 4 (severe): Secondary | ICD-10-CM

## 2019-02-25 ENCOUNTER — Other Ambulatory Visit: Payer: Self-pay | Admitting: Nephrology

## 2019-02-25 DIAGNOSIS — R809 Proteinuria, unspecified: Secondary | ICD-10-CM

## 2019-03-04 ENCOUNTER — Other Ambulatory Visit: Payer: Self-pay | Admitting: Radiology

## 2019-03-05 ENCOUNTER — Other Ambulatory Visit: Payer: Self-pay

## 2019-03-05 ENCOUNTER — Ambulatory Visit
Admission: RE | Admit: 2019-03-05 | Discharge: 2019-03-05 | Disposition: A | Discharge status: Home / Self Care | Payer: PPO | Source: Ambulatory Visit | Attending: Nephrology | Admitting: Nephrology

## 2019-03-05 ENCOUNTER — Ambulatory Visit: Payer: PPO

## 2019-03-05 DIAGNOSIS — N184 Chronic kidney disease, stage 4 (severe): Secondary | ICD-10-CM | POA: Insufficient documentation

## 2019-03-05 DIAGNOSIS — R809 Proteinuria, unspecified: Secondary | ICD-10-CM | POA: Diagnosis not present

## 2019-03-05 DIAGNOSIS — I129 Hypertensive chronic kidney disease with stage 1 through stage 4 chronic kidney disease, or unspecified chronic kidney disease: Secondary | ICD-10-CM | POA: Diagnosis not present

## 2019-03-05 DIAGNOSIS — Z7982 Long term (current) use of aspirin: Secondary | ICD-10-CM | POA: Diagnosis not present

## 2019-03-05 DIAGNOSIS — G309 Alzheimer's disease, unspecified: Secondary | ICD-10-CM | POA: Insufficient documentation

## 2019-03-05 DIAGNOSIS — Z79899 Other long term (current) drug therapy: Secondary | ICD-10-CM | POA: Diagnosis not present

## 2019-03-05 DIAGNOSIS — F0280 Dementia in other diseases classified elsewhere without behavioral disturbance: Secondary | ICD-10-CM | POA: Diagnosis not present

## 2019-03-05 DIAGNOSIS — Z87891 Personal history of nicotine dependence: Secondary | ICD-10-CM | POA: Diagnosis not present

## 2019-03-05 DIAGNOSIS — N189 Chronic kidney disease, unspecified: Secondary | ICD-10-CM | POA: Diagnosis not present

## 2019-03-05 LAB — CBC
HCT: 39.7 % (ref 39.0–52.0)
Hemoglobin: 13 g/dL (ref 13.0–17.0)
MCH: 30 pg (ref 26.0–34.0)
MCHC: 32.7 g/dL (ref 30.0–36.0)
MCV: 91.7 fL (ref 80.0–100.0)
Platelets: 203 10*3/uL (ref 150–400)
RBC: 4.33 MIL/uL (ref 4.22–5.81)
RDW: 12.4 % (ref 11.5–15.5)
WBC: 6 10*3/uL (ref 4.0–10.5)
nRBC: 0 % (ref 0.0–0.2)

## 2019-03-05 LAB — PROTIME-INR
INR: 1.1 (ref 0.8–1.2)
Prothrombin Time: 13.9 seconds (ref 11.4–15.2)

## 2019-03-05 MED ORDER — SODIUM CHLORIDE 0.9 % IV SOLN
INTRAVENOUS | Status: DC
  Administered 2019-03-05: 11:00:00 via INTRAVENOUS

## 2019-03-05 MED ORDER — FENTANYL CITRATE (PF) 100 MCG/2ML IJ SOLN
INTRAMUSCULAR | Status: AC | PRN
  Administered 2019-03-05: 50 ug via INTRAVENOUS

## 2019-03-05 MED ORDER — MIDAZOLAM HCL 2 MG/2ML IJ SOLN
INTRAMUSCULAR | Status: AC
  Filled 2019-03-05: qty 2

## 2019-03-05 MED ORDER — FENTANYL CITRATE (PF) 100 MCG/2ML IJ SOLN
INTRAMUSCULAR | Status: AC
  Filled 2019-03-05: qty 2

## 2019-03-05 MED ORDER — MIDAZOLAM HCL 2 MG/2ML IJ SOLN
INTRAMUSCULAR | Status: AC | PRN
  Administered 2019-03-05: 1 mg via INTRAVENOUS

## 2019-03-05 NOTE — H&P (Signed)
Chief Complaint: Proteinuria  Referring Physician(s): Singh,Harmeet  Supervising Physician: Corrie Mckusick  Patient Status: [facility] - Out-pt  History of Present Illness: Troy Chavez is a 73 y.o. male presenting for a medical renal biopsy.     Past Medical History:  Diagnosis Date  . Alzheimer's disease ([place])   . CKD (chronic kidney disease), stage IV ([place])   . Hypertension     Past Surgical History:  Procedure Laterality Date  . CORONARY/GRAFT ACUTE MI REVASCULARIZATION N/A 05/28/2017   Procedure: Coronary/Graft Acute MI Revascularization;  Surgeon: Wellington Hampshire, MD;  Location: [place] [facility];  Service: Cardiovascular;  Laterality: N/A;  . LEFT HEART CATH AND CORONARY ANGIOGRAPHY N/A 05/28/2017   Procedure: LEFT HEART CATH AND CORONARY ANGIOGRAPHY;  Surgeon: Wellington Hampshire, MD;  Location: [place] [facility];  Service: Cardiovascular;  Laterality: N/A;    Allergies: Patient has no known allergies.  Medications: Prior to Admission medications   Medication Sig Start Date End Date Taking? Authorizing Provider  amLODipine (NORVASC) 5 MG tablet Take 5 mg by mouth 2 (two) times daily. 03/25/17  Yes [provider]  aspirin EC 81 MG tablet Take by mouth.   Yes [provider]  atorvastatin (LIPITOR) 80 MG tablet Take 1 tablet (80 mg total) by mouth daily at 6 PM. 05/30/17  Yes Vaughan Basta, MD  colchicine 0.6 MG tablet Take 0.6 mg by mouth daily as needed.  11/04/18  Yes [provider]  meclizine (ANTIVERT) 25 MG tablet Take 25 mg by mouth 3 (three) times daily as needed for dizziness.   Yes [provider]  pantoprazole (PROTONIX) 40 MG tablet Take 1 tablet (40 mg total) by mouth daily. 05/08/17  Yes Max Sane, MD  rivastigmine (EXELON) 9.5 mg/24hr Place onto the skin. 01/29/19 01/29/20 Yes [provider]  SUMAtriptan (IMITREX) 50 MG tablet Take 50 mg by mouth every 2 (two) hours as needed for migraine.   09/27/18  Yes [provider]  tamsulosin (FLOMAX) 0.4 MG CAPS capsule Take 0.4 mg by mouth daily. 05/06/17  Yes [provider]     Family History  Problem Relation Age of Onset  . Dementia Mother   . Rheum arthritis Father     Social History   Socioeconomic History  . Marital status: Married    Spouse name: Not on file  . Number of children: Not on file  . Years of education: Not on file  . Highest education level: Not on file  Occupational History  . Not on file  Social Needs  . Financial resource strain: Not on file  . Food insecurity    Worry: Not on file    Inability: Not on file  . Transportation needs    Medical: Not on file    Non-medical: Not on file  Tobacco Use  . Smoking status: Former Research scientist (life sciences)  . Smokeless tobacco: Never Used  . Tobacco comment: Quit 35 yeaers ago  Substance and Sexual Activity  . Alcohol use: Yes    Comment: rare drinking for special occasions  . Drug use: No  . Sexual activity: Not on file  Lifestyle  . Physical activity    Days per week: Not on file    Minutes per session: Not on file  . Stress: Not on file  Relationships  . Social Herbalist on phone: Not on file    Gets together: Not on file    Attends religious service: Not on  file    Active member of club or organization: Not on file    Attends meetings of clubs or organizations: Not on file    Relationship status: Not on file  Other Topics Concern  . Not on file  Social History Narrative  . Not on file     Review of Systems: A 12 point ROS discussed and pertinent positives are indicated in the HPI above.  All other systems are negative.  Review of Systems  Vital Signs: BP (!) 152/83 (BP Location: Right Leg)   Pulse 64   Temp 98 F (36.7 C) (Oral)   Resp 11   Ht 5\' 7"  (1.702 m)   Wt 83.5 kg   SpO2 100%   BMI 28.82 kg/m   Physical Exam General: 73 yo male appearing stated age.  Well-developed, well-nourished.  No distress. HEENT:  Atraumatic, normocephalic.  Conjugate gaze, extra-ocular motor intact. No scleral icterus or scleral injection. No lesions on external ears, nose, lips, or gums.  Oral mucosa moist, pink.  Neck: Symmetric with no goiter enlargement.  Chest/Lungs:  Symmetric chest with inspiration/expiration.  No labored breathing.  Clear to auscultation with no wheezes, rhonchi, or rales.  Heart:  RRR, with no third heart sounds appreciated. No JVD appreciated.  Abdomen:  Soft, NT/ND, with + bowel sounds.   Genito-urinary: Deferred Neurologic: Alert & Oriented to person, place, and time.   Normal affect and insight.  Appropriate questions.  Moving all 4 extremities with gross sensory intact.      Imaging: Ct Abdomen Pelvis Wo Contrast  Result Date: 02/03/2019 CLINICAL DATA:  Nausea, vomiting EXAM: CT ABDOMEN AND PELVIS WITHOUT CONTRAST TECHNIQUE: Multidetector CT imaging of the abdomen and pelvis was performed following the standard protocol without IV contrast. COMPARISON:  Lumbar radiographs 03/08/2011 FINDINGS: Lower chest: Lung bases are clear. Normal heart size. No pericardial effusion. Hepatobiliary: No focal liver abnormality is seen. No gallstones, gallbladder wall thickening, or biliary dilatation. Pancreas: Unremarkable. No pancreatic ductal dilatation or surrounding inflammatory changes. Spleen: Normal in size without focal abnormality. Adrenals/Urinary Tract: Normal adrenal glands. Moderate bilateral symmetric perinephric stranding and cortical thinning, a nonspecific finding though may correlate with either age or decreased renal function. Kidneys are otherwise unremarkable, without renal calculi, suspicious lesion, or hydronephrosis. Soft tissue extension of the right anterolateral bladder, possible urachal remnant extending anteriorly. Stomach/Bowel: Distal esophagus, stomach and duodenal sweep are unremarkable. No bowel wall thickening or dilatation. No evidence of obstruction. A normal appendix is  visualized. Scattered colonic diverticula without focal pericolonic inflammation to suggest diverticulitis. No colonic dilatation or wall thickening. Vascular/Lymphatic: Atherosclerotic plaque within the normal caliber aorta. No aneurysm or ectasia. No suspicious or enlarged lymph nodes in the included lymphatic chains. Reproductive: Borderline prostatomegaly with few coarse eccentric calcification the prostate. Seminal vesicles are unremarkable. Other: No abdominopelvic free fluid or free gas. No bowel containing hernias. Bilateral fat containing inguinal hernias, right greater than left, small umbilical fat containing hernia as well Musculoskeletal: Multilevel degenerative changes are present in the imaged portions of the spine. Multilevel Schmorl's node formations are noted with endplate deformities similar in configuration to comparison radiographs from 20/12 IMPRESSION: 1. Moderate bilateral symmetric perinephric stranding and cortical thinning, a nonspecific finding though may correlate with either age or decreased renal function though in the setting of acute symptoms, could consider urinalysis to exclude ascending urinary tract infection. 2. Small soft tissue extension along the anteroinferior bladder, possibly a small urachal remnant. 3. No urolithiasis or hydronephrosis. No other acute abnormality  in the abdomen or pelvis. 4. Aortic Atherosclerosis (ICD10-I70.0). Electronically Signed   By: Lovena Le M.D.   On: 02/03/2019 19:30   Dg Chest 2 View  Result Date: 02/03/2019 CLINICAL DATA:  Chest pain with nausea and vomiting today. EXAM: CHEST - 2 VIEW COMPARISON:  05/06/2017 FINDINGS: Lungs are adequately inflated without consolidation or effusion. Cardiomediastinal silhouette and remainder the exam is unchanged. IMPRESSION: No active cardiopulmonary disease. Electronically Signed   By: Marin Olp M.D.   On: 02/03/2019 16:01   US Renal  Result Date: 02/10/2019 CLINICAL DATA:  Chronic renal  disease EXAM: RENAL / URINARY TRACT ULTRASOUND COMPLETE COMPARISON:  CT abdomen pelvis February 03, 2019 FINDINGS: Right Kidney: Renal measurements: 9.9 x 5.2 x 5.3 cm = volume: 140.7 mL. Renal cortical thinning. Increased renal cortical echogenicity. No hydronephrosis. Partially exophytic cyst superior pole right kidney. Left Kidney: Renal measurements: 11.0 x 5.0 x 5.8 cm = volume: 164.5 mL. Renal cortical thinning. Increased renal cortical echogenicity. No hydronephrosis. Multiple cysts. Bladder: Appears normal for degree of bladder distention. IMPRESSION: No hydronephrosis. Increased renal cortical echogenicity compatible with chronic medical renal disease. Electronically Signed   By: Lovey Newcomer M.D.   On: 02/10/2019 16:04    Labs:  CBC: Recent Labs    02/03/19 1537 02/04/19 1431 03/05/19 1018  WBC 8.1 7.8 6.0  HGB 13.2 12.3* 13.0  HCT 39.0 37.2* 39.7  PLT 231 239 203    COAGS: Recent Labs    03/05/19 1018  INR 1.1    BMP: Recent Labs    02/03/19 1537 02/04/19 1431  NA 140 143  K 3.9 4.2  CL 107 107  CO2 19* 27  GLUCOSE 135* 102*  BUN 33* 31*  CALCIUM 9.3 9.0  CREATININE 2.56* 2.77*  GFRNONAA 24* 22*  GFRAA 27* 25*    LIVER FUNCTION TESTS: Recent Labs    02/03/19 2100 02/04/19 1431  BILITOT 0.5 0.5  AST 18 19  ALT 14 14  ALKPHOS 67 73  PROT 6.5 7.2  ALBUMIN 3.4* 3.7    TUMOR MARKERS: No results for input(s): AFPTM, CEA, CA199, CHROMGRNA in the last 8760 hours.  Assessment and Plan:  73 yo male presents for medical renal biopsy.   Risks and benefits of image guided medical renal biopsy was discussed with the patient and/or patient's family including, but not limited to bleeding, infection, damage to adjacent structures, hospitalization, further surgery/procedure, cardiopulmonary collapse/death, or low yield requiring additional tests.  All of the questions were answered and there is agreement to proceed.  Consent signed and in chart.    Thank  you for this interesting consult.  I greatly enjoyed meeting FAVIAN KITTLESON and look forward to participating in their care.  A copy of this report was sent to the requesting provider on this date.  Electronically Signed: Corrie Mckusick, DO 03/05/2019, 11:44 AM   I spent a total of  30 Minutes   in face to face in clinical consultation, greater than 50% of which was counseling/coordinating care for image guided medical renal biopsy.

## 2019-03-05 NOTE — Procedures (Signed)
Interventional Radiology Procedure Note  Procedure: US guided medical renal biopsy. Left kidney.  Complications: None Recommendations:  - Ok to shower tomorrow - Do not submerge for 7 days - Routine care - 2 hr obs - advance diet   Signed,  Dulcy Fanny. Earleen Newport, DO

## 2019-03-05 NOTE — Discharge Instructions (Signed)
Percutaneous Kidney Biopsy, Care After °This sheet gives you information about how to care for yourself after your procedure. Your health care provider may also give you more specific instructions. If you have problems or questions, contact your health care provider. °What can I expect after the procedure? °After the procedure, it is common to have: °· Pain or soreness near the area where the needle went through your skin (biopsy site). °· Bright pink or cloudy urine for 24 hours after the procedure. °Follow these instructions at home: °Activity °· Return to your normal activities as told by your health care provider. Ask your health care provider what activities are safe for you. °· Do not drive for 24 hours if you were given a medicine to help you relax (sedative). °· Do not lift anything that is heavier than 10 lb (4.5 kg) until your health care provider tells you that it is safe. °· Avoid activities that take a lot of effort (are strenuous) until your health care provider approves. Most people will have to wait 2 weeks before returning to activities such as exercise or sexual intercourse. °General instructions ° °· Take over-the-counter and prescription medicines only as told by your health care provider. °· You may eat and drink after your procedure. Follow instructions from your health care provider about eating or drinking restrictions. °· Check your biopsy site every day for signs of infection. Check for: °? More redness, swelling, or pain. °? More fluid or blood. °? Warmth. °? Pus or a bad smell. °· Keep all follow-up visits as told by your health care provider. This is important. °Contact a health care provider if: °· You have more redness, swelling, or pain around your biopsy site. °· You have more fluid or blood coming from your biopsy site. °· Your biopsy site feels warm to the touch. °· You have pus or a bad smell coming from your biopsy site. °· You have blood in your urine more than 24 hours after  your procedure. °Get help right away if: °· You have dark red or brown urine. °· You have a fever. °· You are unable to urinate. °· You feel burning when you urinate. °· You feel faint. °· You have severe pain in your abdomen or side. °This information is not intended to replace advice given to you by your health care provider. Make sure you discuss any questions you have with your health care provider. °Document Released: 12/25/2012 Document Revised: 04/06/2017 Document Reviewed: 02/04/2016 °Elsevier Patient Education © 2020 [employer] °Moderate Conscious Sedation, Adult, Care After °These instructions provide you with information about caring for yourself after your procedure. Your health care provider may also give you more specific instructions. Your treatment has been planned according to current medical practices, but problems sometimes occur. Call your health care provider if you have any problems or questions after your procedure. °What can I expect after the procedure? °After your procedure, it is common: °· To feel sleepy for several hours. °· To feel clumsy and have poor balance for several hours. °· To have poor judgment for several hours. °· To vomit if you eat too soon. °Follow these instructions at home: °For at least 24 hours after the procedure: ° °· Do not: °? Participate in activities where you could fall or become injured. °? Drive. °? Use heavy machinery. °? Drink alcohol. °? Take sleeping pills or medicines that cause drowsiness. °? Make important decisions or sign legal documents. °? Take care of children on your   own. °· Rest. °Eating and drinking °· Follow the diet recommended by your health care provider. °· If you vomit: °? Drink water, juice, or soup when you can drink without vomiting. °? Make sure you have little or no nausea before eating solid foods. °General instructions °· Have a responsible adult stay with you until you are awake and alert. °· Take over-the-counter and  prescription medicines only as told by your health care provider. °· If you smoke, do not smoke without supervision. °· Keep all follow-up visits as told by your health care provider. This is important. °Contact a health care provider if: °· You keep feeling nauseous or you keep vomiting. °· You feel light-headed. °· You develop a rash. °· You have a fever. °Get help right away if: °· You have trouble breathing. °This information is not intended to replace advice given to you by your health care provider. Make sure you discuss any questions you have with your health care provider. °Document Released: 02/12/2013 Document Revised: 04/06/2017 Document Reviewed: 08/14/2015 °Elsevier Patient Education © 2020 [employer] Inc[employer] ° °

## 2019-03-14 ENCOUNTER — Encounter: Payer: Self-pay | Admitting: Nephrology

## 2019-03-14 LAB — SURGICAL PATHOLOGY

## 2019-04-01 DIAGNOSIS — N041 Nephrotic syndrome with focal and segmental glomerular lesions: Secondary | ICD-10-CM | POA: Diagnosis not present

## 2019-04-01 DIAGNOSIS — N184 Chronic kidney disease, stage 4 (severe): Secondary | ICD-10-CM | POA: Diagnosis not present

## 2019-04-01 DIAGNOSIS — N051 Unspecified nephritic syndrome with focal and segmental glomerular lesions: Secondary | ICD-10-CM | POA: Insufficient documentation

## 2019-04-01 DIAGNOSIS — I10 Essential (primary) hypertension: Secondary | ICD-10-CM | POA: Diagnosis not present

## 2019-05-13 ENCOUNTER — Inpatient Hospital Stay: Payer: PPO | Attending: Oncology

## 2019-05-15 IMAGING — CT CT HEAD W/O CM
1 series · 16 of 30 positions shown, 20 images · non-contrast
Comparison: CT scan of March 08, 2011.

CLINICAL DATA: Memory loss.

EXAM:
CT HEAD WITHOUT CONTRAST
TECHNIQUE: Contiguous axial images were obtained from the base of the skull
through the vertex without intravenous contrast.

[Series 2: head wo · axial · 0.43mm/px · z∈[-54,+80]mm · 16 of 30 slices shown, 20 images]
[im 2/30  brain]
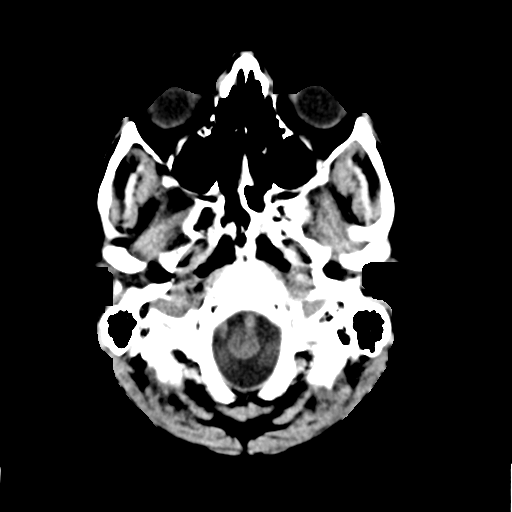
[im 2/30  bone]
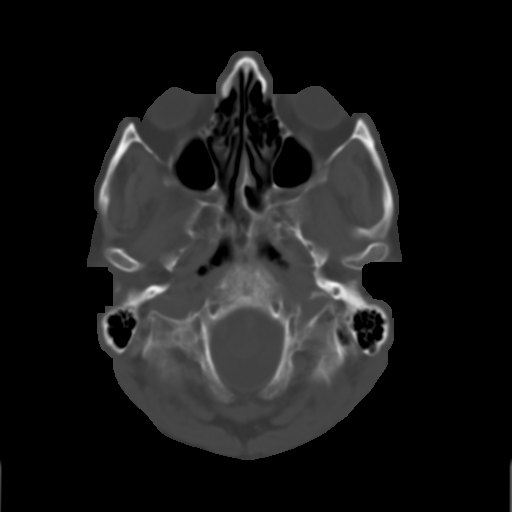
[im 4/30  brain]
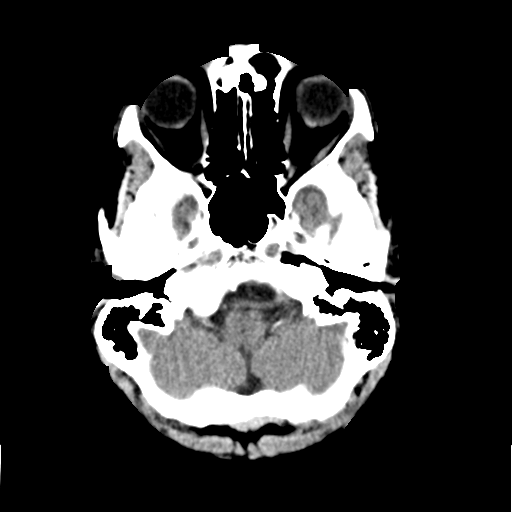
[im 6/30  brain]
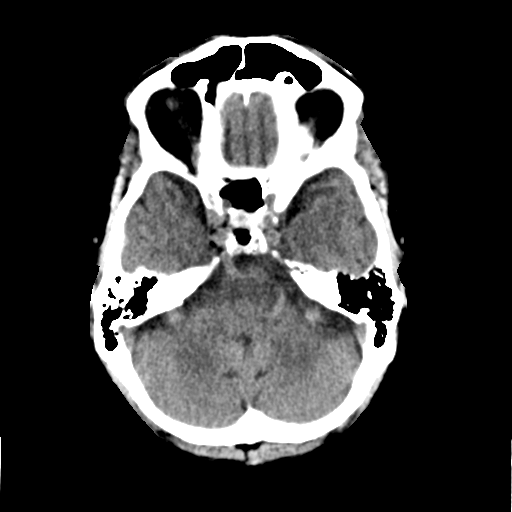
[im 8/30  brain]
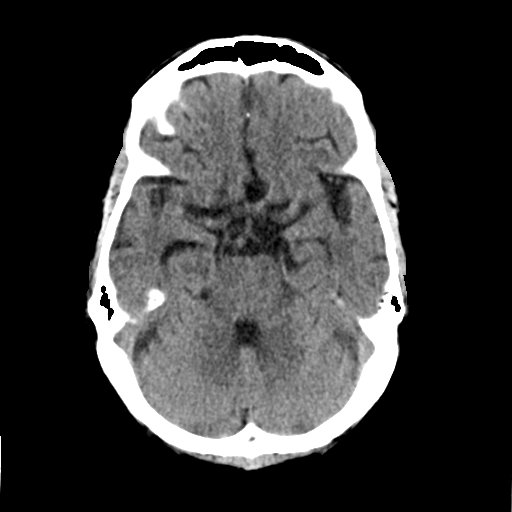
[im 9/30  brain]
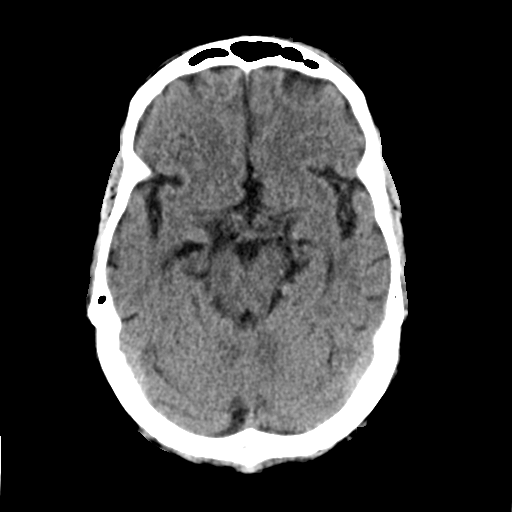
[im 9/30  bone]
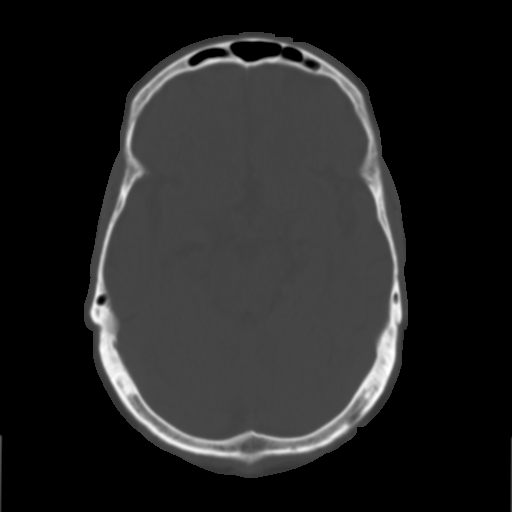
[im 11/30  brain]
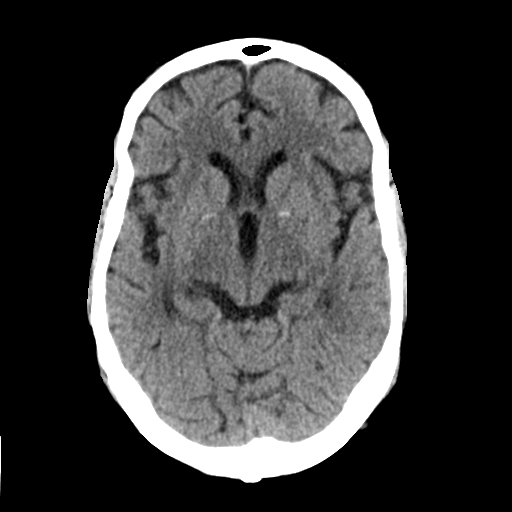
[im 13/30  brain]
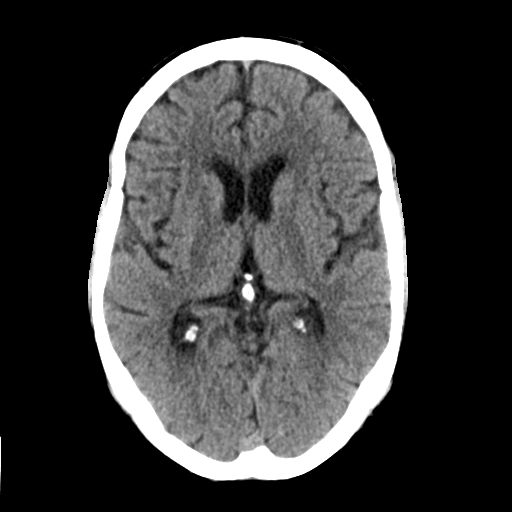
[im 15/30  brain]
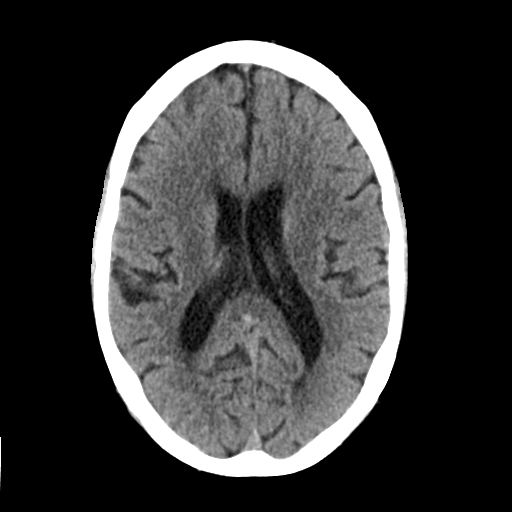
[im 16/30  brain]
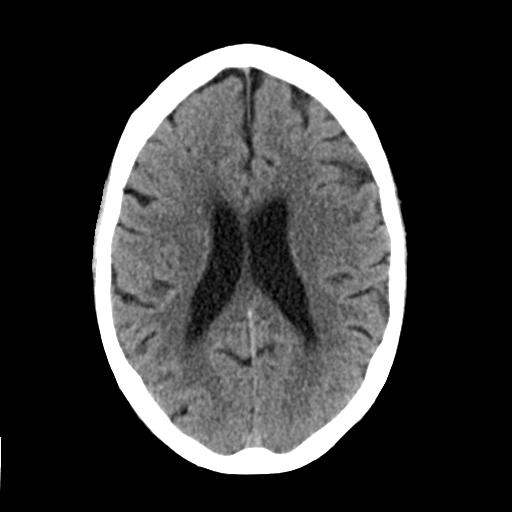
[im 16/30  bone]
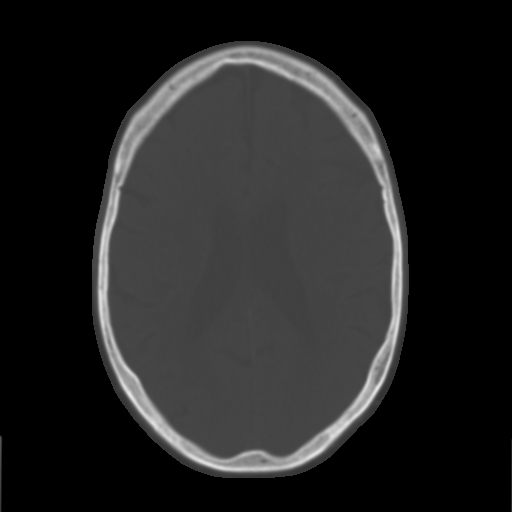
[im 18/30  brain]
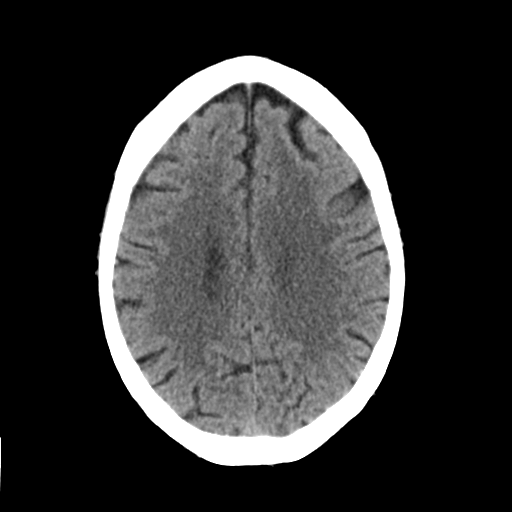
[im 20/30  brain]
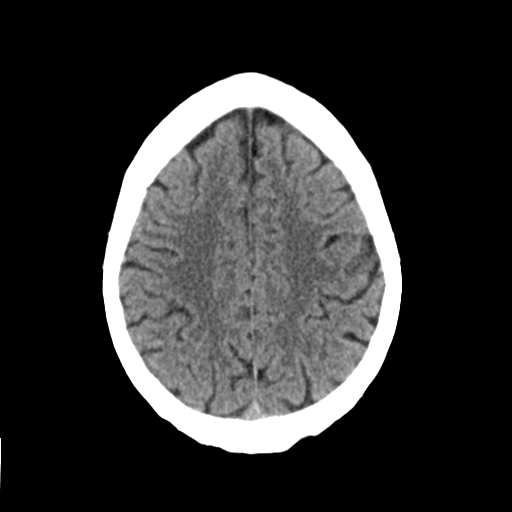
[im 22/30  brain]
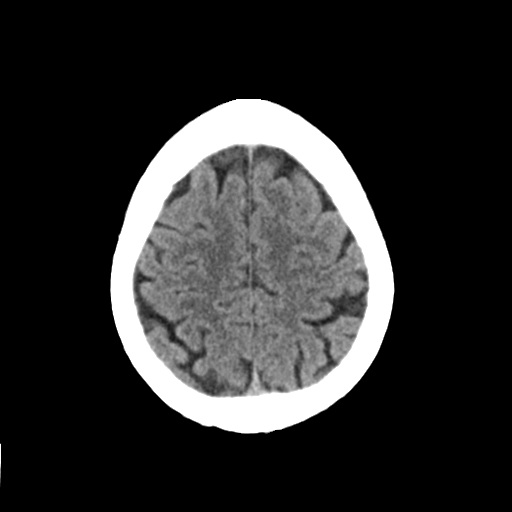
[im 23/30  brain]
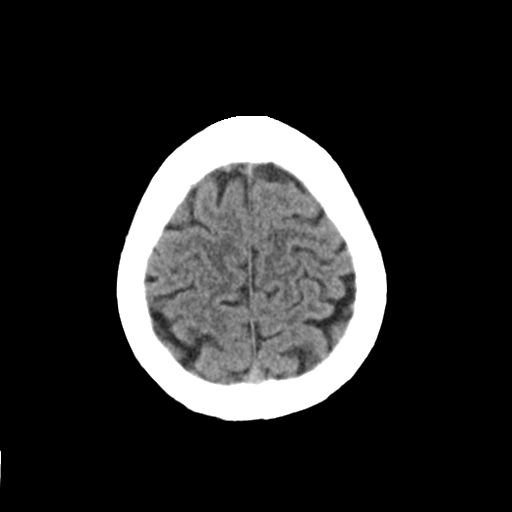
[im 23/30  bone]
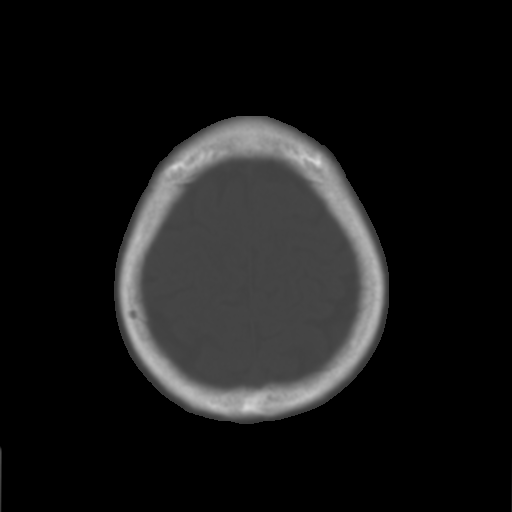
[im 25/30  brain]
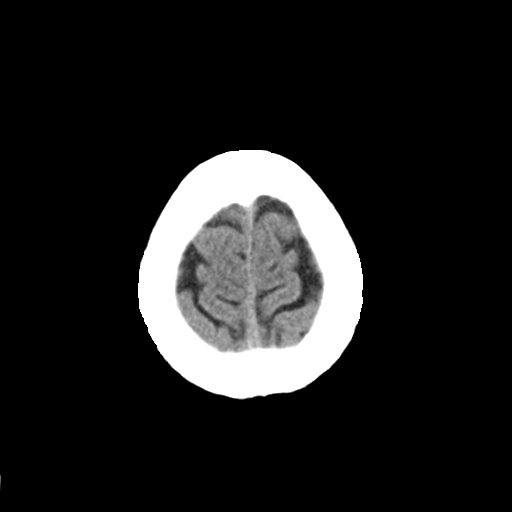
[im 27/30  brain]
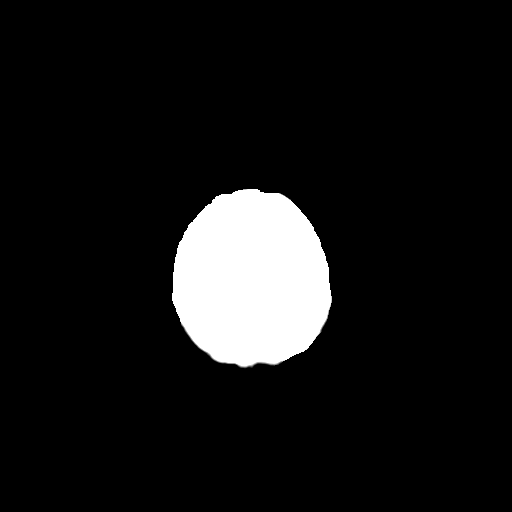
[im 29/30  brain]
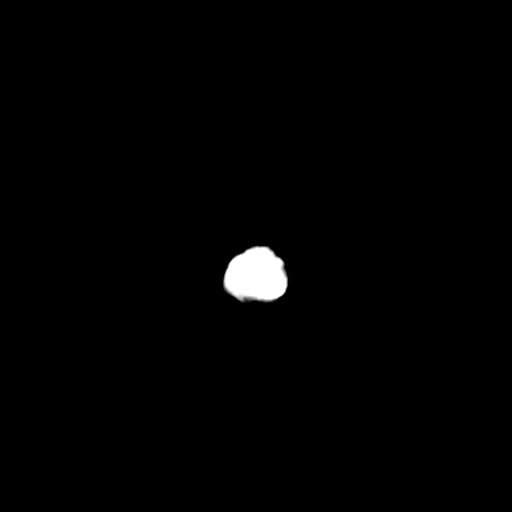

[16 of 30 positions shown; findings below may reference images not displayed]

FINDINGS: Brain: No evidence of acute infarction, hemorrhage, hydrocephalus,
extra-axial collection or mass lesion/mass effect.

Vascular: No hyperdense vessel or unexpected calcification.

Skull: Normal. Negative for fracture or focal lesion.

Sinuses/Orbits: No acute finding.

Other: None.
IMPRESSION: Normal head CT.

## 2019-05-16 DIAGNOSIS — R6889 Other general symptoms and signs: Secondary | ICD-10-CM | POA: Diagnosis not present

## 2019-05-16 DIAGNOSIS — I10 Essential (primary) hypertension: Secondary | ICD-10-CM | POA: Diagnosis not present

## 2019-05-16 DIAGNOSIS — Z20822 Contact with and (suspected) exposure to covid-19: Secondary | ICD-10-CM | POA: Diagnosis not present

## 2019-05-20 ENCOUNTER — Inpatient Hospital Stay: Payer: PPO | Admitting: Oncology

## 2019-06-08 NOTE — Progress Notes (Signed)
Office Visit    Patient Name: Troy Chavez Date of Encounter: 06/09/2019  Primary Care Provider:  Rusty Aus, MD Primary Cardiologist:  Kathlyn Sacramento, MD Electrophysiologist:  None   Chief Complaint    Troy Chavez is a 74 y.o. male with a hx of CAD, HTN, HLD, mild dementia, CKD IV presents today for follow up of CAD.   Past Medical History    Past Medical History:  Diagnosis Date  . Alzheimer's disease ([place])   . CKD (chronic kidney disease), stage IV ([place])   . Hypertension    Past Surgical History:  Procedure Laterality Date  . CORONARY/GRAFT ACUTE MI REVASCULARIZATION N/A 05/28/2017   Procedure: Coronary/Graft Acute MI Revascularization;  Surgeon: Wellington Hampshire, MD;  Location: [facility];  Service: Cardiovascular;  Laterality: N/A;  . LEFT HEART CATH AND CORONARY ANGIOGRAPHY N/A 05/28/2017   Procedure: LEFT HEART CATH AND CORONARY ANGIOGRAPHY;  Surgeon: Wellington Hampshire, MD;  Location: [facility];  Service: Cardiovascular;  Laterality: N/A;    Allergies  No Known Allergies  History of Present Illness    Troy Chavez is a 74 y.o. male with a hx of CAD, HTN, HLD, mild dementia, CKD IV. He was last seen 10/16/2017 by Dr. Fletcher Anon.   Presented January 2019 with lateral STEMI. Emergent cardiac cath with occluded ostial and prox ramus intermedus and 60% stenosis prox LAD and 70% stenosis mid LAD. Underwent anglioplasty and DES to ramus. Noted difficult catheterization of right radial artery due to tortuosity. Echo with LVEF 45-50%.   Following with hematologist for elevated kappa lambda light gain with normal ratio 1.4. Renal ultrasound 02/10/19 with cortical thinning and increased renal cortical echogenicity and multiple cyst. Kidney biopsy 03/05/19 no evidence of immune complex mediated or active GN/renal disease.  Follows with Dr. Candiss Norse of nephrology. At last office visit 03/2019 recommended to continue management with ACE.  Lab 02/18/19:  creatinine 3.15, GFR 18 Lab 01/22/19: creatinine 3.0, GFR 21, AST 15, ALT 12, Hb 12.6 Lab 07/22/18: total cholesterol 188, triglyceride 88, HDL 49, LDL 121  Presents today with his wife.  Reports feeling well.  Denies chest pain, pressure, tightness.  Reports no shortness of breath or dyspnea on exertion.  Checks his blood pressure at home and reports it is routinely 130s over 80s.  He follows closely with Dr. Sabra Heck his primary care provider.  Tries to stay active around the house but no formal exercise regimen.  Endorses trying to eat a heart healthy diet.  EKGs/Labs/Other Studies Reviewed:   The following studies were reviewed today: Echo 05/29/17 Left ventricle: The cavity size was normal. Wall thickness was    normal. Systolic function was mildly reduced. The estimated    ejection fraction was in the range of 45% to 50%. There is    hypokinesis of the anterior and anterolateral myocardium. Doppler    parameters are consistent with abnormal left ventricular    relaxation (grade 1 diastolic dysfunction).  - Aortic root: The aortic root was mildly dilated.  - Ascending aorta: The ascending aorta was mildly dilated.  - Right ventricle: The cavity size was normal. Systolic function    was normal.  - Pulmonary arteries: Systolic pressure was within the normal    range, in the range of 25 mm Hg to 30 mm Hg.   Cath 05/28/17  Prox LAD lesion is 60% stenosed.  Mid LAD lesion is 70% stenosed.  Ost Ramus to Ramus lesion  is 100% stenosed.  A drug-eluting stent was successfully placed using a STENT SIERRA 2.50 X 15 MM.  Post intervention, there is a 0% residual stenosis.   1.  Significant two-vessel coronary artery disease with thrombotic occlusion of ostial ramus which is the culprit for lateral ST elevation myocardial infarction.  There is borderline significant disease in the proximal mid LAD at 60-70%. 2.  Normal left ventricular end-diastolic pressure.  Left ventricular angiography was  not performed due to chronic kidney disease. 3.  Successful angioplasty and drug-eluting stent placement to the ostial ramus.   Recommendations: 1.  Dual antiplatelet therapy for at least one year. 2.  Consider stress testing in about 6 weeks to evaluate for ischemia in the LAD distribution 3.  Obtain an echocardiogram to evaluate LV systolic function. 4.  Hydration for 12 hours to decrease the chance of contrast-induced nephropathy.  A total of 110 mL of contrast was used. 5.  Avoid catheterization via the right radial artery in the future given severe tortuosity of the innominate artery.    EKG:  EKG is ordered today.  The ekg ordered today demonstrates sinus bradycardia 57 bpm with no acute ST/T wave changes  Recent Labs: 02/04/2019: ALT 14; BUN 31; Creatinine, Ser 2.77; Potassium 4.2; Sodium 143 03/05/2019: Hemoglobin 13.0; Platelets 203  Recent Lipid Panel    Component Value Date/Time   CHOL 214 (H) 05/29/2017 0242   TRIG 82 05/29/2017 0242   HDL 52 05/29/2017 0242   CHOLHDL 4.1 05/29/2017 0242   VLDL 16 05/29/2017 0242   LDLCALC 146 (H) 05/29/2017 0242    Home Medications   No outpatient medications have been marked as taking for the 06/09/19 encounter (Appointment) with Loel Dubonnet, NP.    Review of Systems      Review of Systems  Constitution: Negative for chills, fever and malaise/fatigue.  Cardiovascular: Negative for chest pain, dyspnea on exertion, leg swelling, near-syncope, orthopnea, palpitations and syncope.  Respiratory: Negative for cough, shortness of breath and wheezing.   Gastrointestinal: Negative for nausea and vomiting.  Neurological: Negative for dizziness, light-headedness and weakness.   All other systems reviewed and are otherwise negative except as noted above.  Physical Exam    VS:  There were no vitals taken for this visit. , BMI There is no height or weight on file to calculate BMI. GEN: Well nourished, well developed, in no acute  distress. HEENT: normal. Neck: Supple, no JVD, carotid bruits, or masses. Cardiac: RRR, no murmurs, rubs, or gallops. No clubbing, cyanosis, edema.  Radials/DP/PT 2+ and equal bilaterally.  Respiratory:  Respirations regular and unlabored, clear to auscultation bilaterally. GI: Soft, nontender, nondistended, BS + x 4. MS: No deformity or atrophy. Skin: Warm and dry, no rash. Neuro:  Strength and sensation are intact. Psych: Normal affect.  Accessory Clinical Findings    ECG personally reviewed by me today -sinus bradycardia 57 bpm with no acute ST/T wave changes- no acute changes.  Assessment & Plan    1. CAD -stable with no anginal symptoms.  No indication for ischemic evaluation at this time.  Continue GDMT aspirin, atorvastatin.  No beta-blocker secondary to baseline bradycardia. 2. 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  No SOB, DOE, edema.  No indication for repeat evaluation at this time. 3. HTN -blood pressure elevated today in the office, but improved on my recheck.  Checks routinely at home with average of 130/80.  He and his wife will report blood pressure consistently greater than 130/80.  May require addition of antihypertensive therapy. 4. HLD -lipid panel 07/2018 with LDL 129.  We discussed an LDL goal of less than 70 in setting of CAD.  He is hesitant to accept additional therapy.  Prefers to follow-up with his PCP in March for his repeat follow-up and will discuss that day with him at that time. 5. CKD IV -follows closely with Dr. Candiss Norse nephrology.  Careful titration of antihypertensive medications.  Avoid nephrotoxic agents.  Disposition: Follow up in 6 month(s) with Dr. Fletcher Anon or APP  Loel Dubonnet, NP 06/09/2019, 7:36 AM

## 2019-06-09 ENCOUNTER — Other Ambulatory Visit: Payer: Self-pay

## 2019-06-09 ENCOUNTER — Encounter: Payer: Self-pay | Admitting: Family

## 2019-06-09 ENCOUNTER — Ambulatory Visit (INDEPENDENT_AMBULATORY_CARE_PROVIDER_SITE_OTHER): Payer: PPO | Admitting: Family

## 2019-06-09 VITALS — BP 160/90 | HR 57 | Ht 67.0 in | Wt 177.5 lb

## 2019-06-09 DIAGNOSIS — I10 Essential (primary) hypertension: Secondary | ICD-10-CM | POA: Diagnosis not present

## 2019-06-09 DIAGNOSIS — N184 Chronic kidney disease, stage 4 (severe): Secondary | ICD-10-CM

## 2019-06-09 DIAGNOSIS — E785 Hyperlipidemia, unspecified: Secondary | ICD-10-CM

## 2019-06-09 DIAGNOSIS — I255 Ischemic cardiomyopathy: Secondary | ICD-10-CM | POA: Diagnosis not present

## 2019-06-09 DIAGNOSIS — I2510 Atherosclerotic heart disease of native coronary artery without angina pectoris: Secondary | ICD-10-CM | POA: Diagnosis not present

## 2019-06-09 NOTE — Patient Instructions (Addendum)
Medication Instructions:  No medication changes today.  *If you need a refill on your cardiac medications before your next appointment, please call your pharmacy*  Lab Work: No lab work today.  Testing/Procedures: You had an EKG today.  It showed sinus bradycardia which is a good and stable result.   Follow-Up: At [facility], you and your health needs are our priority.  As part of our continuing mission to provide you with exceptional heart care, we have created designated Provider Care Teams.  These Care Teams include your primary Cardiologist (physician) and Advanced Practice Providers (APPs -  Physician Assistants and Nurse Practitioners) who all work together to provide you with the care you need, when you need it.  Your next appointment:   6 month(s)  The format for your next appointment:   In Person  Provider:   Kathlyn Sacramento, MD  Other Instructions  Would recommend blood pressure goal of less than 130/80. If your blood pressure at home is consistently higher, please let us or Dr. Sabra Heck know.   Because of your heart disease (coronary artery disease) we would ultimately like for your LDL or "bad cholesterol" to be less than 70. If your LDL remains more than 70 we would recommend adding a medication called Zetia. Would recommend discussing with Dr. Sabra Heck after your cholesterol check in March.   Ezetimibe Tablets What is this medicine? EZETIMIBE (ez ET i mibe) blocks the absorption of cholesterol from the stomach. It can help lower blood cholesterol for patients who are at risk of getting heart disease or a stroke. It is only for patients whose cholesterol level is not controlled by diet. This medicine may be used for other purposes; ask your health care provider or pharmacist if you have questions. COMMON BRAND NAME(S): Zetia What should I tell my health care provider before I take this medicine? They need to know if you have any of these conditions:  liver  disease  an unusual or allergic reaction to ezetimibe, medicines, foods, dyes, or preservatives  pregnant or trying to get pregnant  breast-feeding How should I use this medicine? Take this medicine by mouth with a glass of water. Follow the directions on the prescription label. This medicine can be taken with or without food. Take your doses at regular intervals. Do not take your medicine more often than directed. Talk to your pediatrician regarding the use of this medicine in children. Special care may be needed. Overdosage: If you think you have taken too much of this medicine contact a poison control center or emergency room at once. NOTE: This medicine is only for you. Do not share this medicine with others. What if I miss a dose? If you miss a dose, take it as soon as you can. If it is almost time for your next dose, take only that dose. Do not take double or extra doses. What may interact with this medicine? Do not take this medicine with any of the following medications:  fenofibrate  gemfibrozil This medicine may also interact with the following medications:  antacids  cyclosporine  herbal medicines like red yeast rice  other medicines to lower cholesterol or triglycerides This list may not describe all possible interactions. Give your health care provider a list of all the medicines, herbs, non-prescription drugs, or dietary supplements you use. Also tell them if you smoke, drink alcohol, or use illegal drugs. Some items may interact with your medicine. What should I watch for while using this medicine? Visit your  doctor or health care professional for regular checks on your progress. You will need to have your cholesterol levels checked. If you are also taking some other cholesterol medicines, you will also need to have tests to make sure your liver is working properly. Tell your doctor or health care professional if you get any unexplained muscle pain, tenderness, or  weakness, especially if you also have a fever and tiredness. You need to follow a low-cholesterol, low-fat diet while you are taking this medicine. This will decrease your risk of getting heart and blood vessel disease. Exercising and avoiding alcohol and smoking can also help. Ask your doctor or dietician for advice. What side effects may I notice from receiving this medicine? Side effects that you should report to your doctor or health care professional as soon as possible:  allergic reactions like skin rash, itching or hives, swelling of the face, lips, or tongue  dark yellow or brown urine  unusually weak or tired  yellowing of the skin or eyes Side effects that usually do not require medical attention (report to your doctor or health care professional if they continue or are bothersome):  diarrhea  dizziness  headache  stomach upset or pain This list may not describe all possible side effects. Call your doctor for medical advice about side effects. You may report side effects to FDA at 1-800-FDA-1088. Where should I keep my medicine? Keep out of the reach of children. Store at room temperature between 15 and 30 degrees C (59 and 86 degrees F). Protect from moisture. Keep container tightly closed. Throw away any unused medicine after the expiration date. NOTE: This sheet is a summary. It may not cover all possible information. If you have questions about this medicine, talk to your doctor, pharmacist, or health care provider.  2020 Elsevier/Gold Standard (2011-10-30 15:39:09)

## 2019-06-23 DIAGNOSIS — N184 Chronic kidney disease, stage 4 (severe): Secondary | ICD-10-CM | POA: Diagnosis not present

## 2019-06-23 DIAGNOSIS — M109 Gout, unspecified: Secondary | ICD-10-CM | POA: Diagnosis not present

## 2019-06-23 DIAGNOSIS — F0150 Vascular dementia without behavioral disturbance: Secondary | ICD-10-CM | POA: Diagnosis not present

## 2019-10-03 DIAGNOSIS — Z125 Encounter for screening for malignant neoplasm of prostate: Secondary | ICD-10-CM | POA: Diagnosis not present

## 2019-10-03 DIAGNOSIS — E782 Mixed hyperlipidemia: Secondary | ICD-10-CM | POA: Diagnosis not present

## 2019-10-03 DIAGNOSIS — R801 Persistent proteinuria, unspecified: Secondary | ICD-10-CM | POA: Diagnosis not present

## 2019-10-13 DIAGNOSIS — N060 Isolated proteinuria with minor glomerular abnormality: Secondary | ICD-10-CM | POA: Diagnosis not present

## 2019-10-13 DIAGNOSIS — Z0000 Encounter for general adult medical examination without abnormal findings: Secondary | ICD-10-CM | POA: Diagnosis not present

## 2019-10-13 DIAGNOSIS — F0150 Vascular dementia without behavioral disturbance: Secondary | ICD-10-CM | POA: Diagnosis not present

## 2019-10-13 DIAGNOSIS — N185 Chronic kidney disease, stage 5: Secondary | ICD-10-CM | POA: Diagnosis not present

## 2019-11-17 DIAGNOSIS — H52223 Regular astigmatism, bilateral: Secondary | ICD-10-CM | POA: Diagnosis not present

## 2019-11-17 DIAGNOSIS — D3132 Benign neoplasm of left choroid: Secondary | ICD-10-CM | POA: Diagnosis not present

## 2019-11-17 DIAGNOSIS — Z9842 Cataract extraction status, left eye: Secondary | ICD-10-CM | POA: Diagnosis not present

## 2019-11-17 DIAGNOSIS — Z9841 Cataract extraction status, right eye: Secondary | ICD-10-CM | POA: Diagnosis not present

## 2019-11-17 DIAGNOSIS — N185 Chronic kidney disease, stage 5: Secondary | ICD-10-CM | POA: Diagnosis not present

## 2019-12-09 ENCOUNTER — Other Ambulatory Visit: Payer: Self-pay

## 2019-12-09 ENCOUNTER — Encounter: Payer: Self-pay | Admitting: Cardiovascular Disease

## 2019-12-09 ENCOUNTER — Ambulatory Visit: Payer: PPO | Admitting: Cardiovascular Disease

## 2019-12-09 VITALS — BP 150/90 | HR 55 | Ht 68.0 in | Wt 178.2 lb

## 2019-12-09 DIAGNOSIS — I2510 Atherosclerotic heart disease of native coronary artery without angina pectoris: Secondary | ICD-10-CM

## 2019-12-09 DIAGNOSIS — E785 Hyperlipidemia, unspecified: Secondary | ICD-10-CM | POA: Diagnosis not present

## 2019-12-09 DIAGNOSIS — I10 Essential (primary) hypertension: Secondary | ICD-10-CM

## 2019-12-09 DIAGNOSIS — I255 Ischemic cardiomyopathy: Secondary | ICD-10-CM | POA: Diagnosis not present

## 2019-12-09 MED ORDER — EZETIMIBE 10 MG PO TABS: 10.0000 mg | ORAL_TABLET | Freq: Every day | ORAL | 3 refills | Status: DC

## 2019-12-09 NOTE — Progress Notes (Signed)
Cardiology Office Note   Date:  12/09/2019   ID:  Troy Chavez, DOB [birth year], MRN [id number]  PCP:  Rusty Aus, MD  Cardiologist:   Kathlyn Sacramento, MD   Chief Complaint  Patient presents with  . office visit    6 month F/U; Meds verbally reviewed with patient.      History of Present Illness: Troy Chavez is a 74 y.o. male who presents for a follow-up visit regarding coronary artery disease.  He has known history of hypertension, hyperlipidemia, mild dementia and stage IV chronic kidney disease. He presented in January of 2019 with lateral ST elevation myocardial infarction.  Emergent cardiac catheterization showed occluded ostial and proximal ramus intermedius artery.  There was also 60% stenosis in the proximal LAD and 70% stenosis in the mid LAD. I performed successful angioplasty and drug-eluting stent placement to the ramus without complications.  Catheterization via the right radial artery was difficult due to severe tortuosity of the innominate artery. Echocardiogram showed an EF of 45-50%.   He has been doing well with no recent chest pain, shortness of breath or palpitations.  Most recent creatinine was 3.3.  He reports that his lisinopril was discontinued.  His blood pressure is elevated today.  He reports that his blood pressure is more controlled at home.  Past Medical History:  Diagnosis Date  . Alzheimer's disease ([place])   . CKD (chronic kidney disease), stage IV ([place])   . Hypertension     Past Surgical History:  Procedure Laterality Date  . CARDIAC CATHETERIZATION    . CORONARY/GRAFT ACUTE MI REVASCULARIZATION N/A 05/28/2017   Procedure: Coronary/Graft Acute MI Revascularization;  Surgeon: Wellington Hampshire, MD;  Location: [facility];  Service: Cardiovascular;  Laterality: N/A;  . LEFT HEART CATH AND CORONARY ANGIOGRAPHY N/A 05/28/2017   Procedure: LEFT HEART CATH AND CORONARY ANGIOGRAPHY;  Surgeon: Wellington Hampshire, MD;  Location: [facility];  Service: Cardiovascular;  Laterality: N/A;     Current Outpatient Medications  Medication Sig Dispense Refill  . amLODipine (NORVASC) 5 MG tablet Take 5 mg by mouth 2 (two) times daily.  11  . atorvastatin (LIPITOR) 80 MG tablet Take 1 tablet (80 mg total) by mouth daily at 6 PM. 30 tablet 0  . colchicine 0.6 MG tablet Take 0.6 mg by mouth daily as needed.     . meclizine (ANTIVERT) 25 MG tablet Take 25 mg by mouth 3 (three) times daily as needed for dizziness.    . pantoprazole (PROTONIX) 40 MG tablet Take 1 tablet (40 mg total) by mouth daily. 60 tablet 0  . rivastigmine (EXELON) 9.5 mg/24hr Place 9.5 mg onto the skin as needed.     . SUMAtriptan (IMITREX) 50 MG tablet Take 50 mg by mouth every 2 (two) hours as needed for migraine.     . tamsulosin (FLOMAX) 0.4 MG CAPS capsule Take 0.4 mg by mouth daily.    Marland Kitchen aspirin EC 81 MG tablet Take by mouth.    . ezetimibe (ZETIA) 10 MG tablet Take 1 tablet (10 mg total) by mouth daily. 90 tablet 3   No current facility-administered medications for this visit.    Allergies:   Codeine    Social History:  The patient  reports that he has quit smoking. He has never used smokeless tobacco. He reports current alcohol use. He reports that he does not use drugs.   Family History:  The patient's family history includes Dementia  in his mother; Rheum arthritis in his father.    ROS:  Please see the history of present illness.   Otherwise, review of systems are positive for none.   All other systems are reviewed and negative.    PHYSICAL EXAM: VS:  BP (!) 150/90 (BP Location: Left Arm, Patient Position: Sitting, Cuff Size: Normal)   Pulse (!) 55   Ht 5\' 8"  (1.727 m)   Wt 178 lb 4 oz (80.9 kg)   SpO2 98%   BMI 27.10 kg/m  , BMI Body mass index is 27.1 kg/m. GEN: Well nourished, well developed, in no acute distress  HEENT: normal  Neck: no JVD, carotid bruits, or masses Cardiac: RRR; no murmurs, rubs, or gallops,no edema    Respiratory:  clear to auscultation bilaterally, normal work of breathing GI: soft, nontender, nondistended, + BS MS: no deformity or atrophy  Skin: warm and dry, no rash Neuro:  Strength and sensation are intact Psych: euthymic mood, full affect Distal pulses are normal bilaterally.  EKG:  EKG is ordered today. The ekg ordered today demonstrates sinus bradycardia with no significant ST or T wave changes.   Recent Labs: 02/04/2019: ALT 14; BUN 31; Creatinine, Ser 2.77; Potassium 4.2; Sodium 143 03/05/2019: Hemoglobin 13.0; Platelets 203    Lipid Panel    Component Value Date/Time   CHOL 214 (H) 05/29/2017 0242   TRIG 82 05/29/2017 0242   HDL 52 05/29/2017 0242   CHOLHDL 4.1 05/29/2017 0242   VLDL 16 05/29/2017 0242   LDLCALC 146 (H) 05/29/2017 0242      Wt Readings from Last 3 Encounters:  12/09/19 178 lb 4 oz (80.9 kg)  06/09/19 177 lb 8 oz (80.5 kg)  03/05/19 184 lb (83.5 kg)       No flowsheet data found.    ASSESSMENT AND PLAN:  1.  Coronary artery disease involving native coronary arteries without angina: He is overall doing well with no anginal symptoms.   Continue aspirin indefinitely.  2.  Mild ischemic cardiomyopathy: Ejection fraction was 45-50%: He is no longer on carvedilol due to bradycardia.  Lisinopril was discontinued due to side effects and also advanced chronic kidney disease.  3.  Essential hypertension: Blood pressure is significantly elevated.  Not able to use beta-blockers due to bradycardia.  ACE/ARB/diuretics are not a good option with his advanced chronic kidney disease.  We should consider adding hydralazine.  I asked him to check his blood pressure at home.  4.  Hyperlipidemia: Continue high-dose atorvastatin.  I reviewed most recent lipid profile which showed an LDL of 100.  I elected to add Zetia 10 mg daily.  We will ask Dr. Sabra Heck to check his lipid profile with his next labs.   Disposition:   FU with me in 6  months  Signed,  Kathlyn Sacramento, MD  12/09/2019 3:04 PM    [facility]

## 2019-12-09 NOTE — Patient Instructions (Signed)
Medication Instructions:  Your physician has recommended you make the following change in your medication:  START Zetia 10mg  daily. An Rx has been sent to your pharmacy  *If you need a refill on your cardiac medications before your next appointment, please call your pharmacy*   Lab Work: Please have your primary care provider share the results of your upcoming fasting labs  If you have labs (blood work) drawn today and your tests are completely normal, you will receive your results only by: Marland Kitchen MyChart Message (if you have MyChart) OR . A paper copy in the mail If you have any lab test that is abnormal or we need to change your treatment, we will call you to review the results.   Testing/Procedures: None ordered   Follow-Up: At [facility], you and your health needs are our priority.  As part of our continuing mission to provide you with exceptional heart care, we have created designated Provider Care Teams.  These Care Teams include your primary Cardiologist (physician) and Advanced Practice Providers (APPs -  Physician Assistants and Nurse Practitioners) who all work together to provide you with the care you need, when you need it.  We recommend signing up for the patient portal called "MyChart".  Sign up information is provided on this After Visit Summary.  MyChart is used to connect with patients for Virtual Visits (Telemedicine).  Patients are able to view lab/test results, encounter notes, upcoming appointments, etc.  Non-urgent messages can be sent to your provider as well.   To learn more about what you can do with MyChart, go to [email or web address].    Your next appointment:   Your physician wants you to follow-up in: 6 months You will receive a reminder letter in the mail two months in advance. If you don't receive a letter, please call our office to schedule the follow-up appointment.   The format for your next appointment:   In Person  Provider:    You may see  Kathlyn Sacramento, MD or one of the following Advanced Practice Providers on your designated Care Team:    Murray Hodgkins, NP  Christell Faith, PA-C  Marrianne Mood, PA-C    Other Instructions N/A

## 2019-12-29 DIAGNOSIS — N185 Chronic kidney disease, stage 5: Secondary | ICD-10-CM | POA: Diagnosis not present

## 2020-04-06 DIAGNOSIS — N185 Chronic kidney disease, stage 5: Secondary | ICD-10-CM | POA: Diagnosis not present

## 2020-04-06 DIAGNOSIS — N060 Isolated proteinuria with minor glomerular abnormality: Secondary | ICD-10-CM | POA: Diagnosis not present

## 2020-04-06 DIAGNOSIS — E782 Mixed hyperlipidemia: Secondary | ICD-10-CM | POA: Diagnosis not present

## 2020-04-14 DIAGNOSIS — F0150 Vascular dementia without behavioral disturbance: Secondary | ICD-10-CM | POA: Diagnosis not present

## 2020-04-14 DIAGNOSIS — E782 Mixed hyperlipidemia: Secondary | ICD-10-CM | POA: Diagnosis not present

## 2020-04-14 DIAGNOSIS — N185 Chronic kidney disease, stage 5: Secondary | ICD-10-CM | POA: Diagnosis not present

## 2020-04-14 DIAGNOSIS — Z125 Encounter for screening for malignant neoplasm of prostate: Secondary | ICD-10-CM | POA: Diagnosis not present

## 2020-07-26 NOTE — Progress Notes (Signed)
Office Visit    Patient Name: Troy Chavez Date of Encounter: 07/27/2020  PCP:  Rusty Aus, MD   [place]  Cardiologist:  Kathlyn Sacramento, MD  Advanced Practice Provider:  No care team member to display Electrophysiologist:  None   Chief Complaint    Troy Chavez is a 75 y.o. male with a hx of CAD, HTN, HLD, mild dementia, CKD IV presents today for follow up of coronary disease.   Past Medical History    Past Medical History:  Diagnosis Date  . Alzheimer's disease ([place])   . CKD (chronic kidney disease), stage IV ([place])   . Hypertension    Past Surgical History:  Procedure Laterality Date  . CARDIAC CATHETERIZATION    . CORONARY/GRAFT ACUTE MI REVASCULARIZATION N/A 05/28/2017   Procedure: Coronary/Graft Acute MI Revascularization;  Surgeon: Wellington Hampshire, MD;  Location: [facility];  Service: Cardiovascular;  Laterality: N/A;  . LEFT HEART CATH AND CORONARY ANGIOGRAPHY N/A 05/28/2017   Procedure: LEFT HEART CATH AND CORONARY ANGIOGRAPHY;  Surgeon: Wellington Hampshire, MD;  Location: [facility];  Service: Cardiovascular;  Laterality: N/A;    Allergies  Allergies  Allergen Reactions  . Codeine     headache    History of Present Illness    Troy Chavez is a 75 y.o. male with a hx of CAD, HTN, HLD, mild dementia, CKD IV  last seen August 2021 by Dr. Fletcher Anon.  Presented January 2019 with lateral STEMI. Emergent cardiac cath with occluded ostial and prox ramus intermedus and 60% stenosis prox LAD and 70% stenosis mid LAD. Underwent anglioplasty and DES to ramus. Noted difficult catheterization of right radial artery due to tortuosity. Echo with LVEF 45-50%.    Following with hematologist for elevated kappa lambda light gain with normal ratio 1.4. Renal ultrasound 02/10/19 with cortical thinning and increased renal cortical echogenicity and multiple cyst. Kidney biopsy 03/05/19 no evidence of immune complex mediated or  active GN/renal disease.   He was seen February 2021 doing overall well from cardiac perspective though LDL noted to be above goal of 70, he preferred to follow with his PCP at that time. Seen by Dr. Fletcher Anon 12/09/19 and noted that Lisinopril was discontinued due to creatinine 3.3. Carvedilol had been previously discontinued due to bradycardia. He was started on Zetia 10mg  daily at that time with recommendation to consider Hydralazine if his BP remained elevated.   04/06/20 labs via Care Everywhere LDL 62  He presents today for follow up with his wife.  We reviewed his recent LDL was very pleased with the result.  His wife reports he has occasional bilateral ankle edema which resolves with elevation.  Encourage low-salt diet.  He was unaware of the swelling and tells me it is not bothersome. Reports no shortness of breath nor dyspnea on exertion. Reports no chest pain, pressure, or tightness. No , orthopnea, PND. Reports no palpitations.  Monitoring BP periodically at home with readings of greater than 130.  EKGs/Labs/Other Studies Reviewed:   The following studies were reviewed today:  Echo 05/29/17 Left ventricle: The cavity size was normal. Wall thickness was    normal. Systolic function was mildly reduced. The estimated    ejection fraction was in the range of 45% to 50%. There is    hypokinesis of the anterior and anterolateral myocardium. Doppler    parameters are consistent with abnormal left ventricular    relaxation (grade 1 diastolic dysfunction).  -  Aortic root: The aortic root was mildly dilated.  - Ascending aorta: The ascending aorta was mildly dilated.  - Right ventricle: The cavity size was normal. Systolic function    was normal.  - Pulmonary arteries: Systolic pressure was within the normal    range, in the range of 25 mm Hg to 30 mm Hg.    Cath 05/28/17  Prox LAD lesion is 60% stenosed.  Mid LAD lesion is 70% stenosed.  Ost Ramus to Ramus lesion is 100% stenosed.  A  drug-eluting stent was successfully placed using a STENT SIERRA 2.50 X 15 MM.  Post intervention, there is a 0% residual stenosis.   1.  Significant two-vessel coronary artery disease with thrombotic occlusion of ostial ramus which is the culprit for lateral ST elevation myocardial infarction.  There is borderline significant disease in the proximal mid LAD at 60-70%. 2.  Normal left ventricular end-diastolic pressure.  Left ventricular angiography was not performed due to chronic kidney disease. 3.  Successful angioplasty and drug-eluting stent placement to the ostial ramus.   Recommendations: 1.  Dual antiplatelet therapy for at least one year. 2.  Consider stress testing in about 6 weeks to evaluate for ischemia in the LAD distribution 3.  Obtain an echocardiogram to evaluate LV systolic function. 4.  Hydration for 12 hours to decrease the chance of contrast-induced nephropathy.  A total of 110 mL of contrast was used. 5.  Avoid catheterization via the right radial artery in the future given severe tortuosity of the innominate artery.   EKG:  EKG is  ordered today.  The ekg ordered today demonstrates normal sinus rhythm 63 bpm with no acute ST/T wave changes  Recent Labs: No results found for requested labs within last 8760 hours.  Recent Lipid Panel    Component Value Date/Time   CHOL 214 (H) 05/29/2017 0242   TRIG 82 05/29/2017 0242   HDL 52 05/29/2017 0242   CHOLHDL 4.1 05/29/2017 0242   VLDL 16 05/29/2017 0242   LDLCALC 146 (H) 05/29/2017 0242    Home Medications   Current Meds  Medication Sig  . aspirin EC 81 MG tablet Take 1 tablet (81 mg total) by mouth daily. Swallow whole.  Marland Kitchen atorvastatin (LIPITOR) 80 MG tablet Take 1 tablet (80 mg total) by mouth daily at 6 PM.  . colchicine 0.6 MG tablet Take 0.6 mg by mouth daily as needed.   . ezetimibe (ZETIA) 10 MG tablet Take 1 tablet (10 mg total) by mouth daily.  . hydrALAZINE (APRESOLINE) 25 MG tablet Take 1 tablet (25 mg  total) by mouth in the morning and at bedtime.  . meclizine (ANTIVERT) 25 MG tablet Take 25 mg by mouth 3 (three) times daily as needed for dizziness.  . pantoprazole (PROTONIX) 40 MG tablet Take 1 tablet (40 mg total) by mouth daily.  . rivastigmine (EXELON) 9.5 mg/24hr Place 9.5 mg onto the skin as needed.   . SUMAtriptan (IMITREX) 50 MG tablet Take 50 mg by mouth every 2 (two) hours as needed for migraine.   . tamsulosin (FLOMAX) 0.4 MG CAPS capsule Take 0.4 mg by mouth daily.     Review of Systems  All other systems reviewed and are otherwise negative except as noted above.  Physical Exam    VS:  BP (!) 156/96 (BP Location: Left Arm, Patient Position: Sitting, Cuff Size: Normal)   Pulse 63   Ht 5\' 9"  (1.753 m)   Wt 181 lb (82.1 kg)   BMI  26.73 kg/m  , BMI Body mass index is 26.73 kg/m.  Wt Readings from Last 3 Encounters:  07/27/20 181 lb (82.1 kg)  12/09/19 178 lb 4 oz (80.9 kg)  06/09/19 177 lb 8 oz (80.5 kg)    GEN: Well nourished, overweight, well developed, in no acute distress. HEENT: normal. Neck: Supple, no JVD, carotid bruits, or masses. Cardiac: RRR, no murmurs, rubs, or gallops. No clubbing, cyanosis, edema.  Radials/DP/PT 2+ and equal bilaterally.  Respiratory:  Respirations regular and unlabored, clear to auscultation bilaterally. GI: Soft, nontender, nondistended. MS: No deformity or atrophy. Skin: Warm and dry, no rash. Neuro:  Strength and sensation are intact. Psych: Normal affect.  Assessment & Plan    1. CAD -stable with no anginal symptoms.  GDMT includes aspirin, statin, Zetia.  No indication for ischemic evaluation at this time.  Heart healthy diet and cardiovascular exercise encouraged.  2. 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  Euvolemic, compensated on exam.  GDMT limited by renal function.  No indication for loop diuretic at this time.  Hydralazine started, as below.  3. HTN -BP elevated.  His amlodipine was previously discontinued by  another provider due to feeling poorly on the medication per his wife's report.  Will start hydralazine 25 mg twice daily.  Phone call in 2 weeks to check in on response of blood pressure and consider increased dose as needed.  4. HLD -recent LDL less than 70.  Continue Zetia 10 mg daily, atorvastatin 80 mg daily.  5. CKD IV - Follows with Dr. Candiss Norse.  Disposition: Follow up in 6 month(s) with Dr. Fletcher Anon or APP   Signed, Loel Dubonnet, NP 07/27/2020, 4:08 PM [place]

## 2020-07-27 ENCOUNTER — Encounter: Payer: Self-pay | Admitting: Family

## 2020-07-27 ENCOUNTER — Ambulatory Visit: Payer: Medicare Other | Admitting: Family

## 2020-07-27 ENCOUNTER — Other Ambulatory Visit: Payer: Self-pay

## 2020-07-27 VITALS — BP 156/96 | HR 63 | Ht 69.0 in | Wt 181.0 lb

## 2020-07-27 DIAGNOSIS — I10 Essential (primary) hypertension: Secondary | ICD-10-CM | POA: Diagnosis not present

## 2020-07-27 DIAGNOSIS — I255 Ischemic cardiomyopathy: Secondary | ICD-10-CM

## 2020-07-27 DIAGNOSIS — E785 Hyperlipidemia, unspecified: Secondary | ICD-10-CM

## 2020-07-27 DIAGNOSIS — I2510 Atherosclerotic heart disease of native coronary artery without angina pectoris: Secondary | ICD-10-CM | POA: Diagnosis not present

## 2020-07-27 DIAGNOSIS — N184 Chronic kidney disease, stage 4 (severe): Secondary | ICD-10-CM

## 2020-07-27 MED ORDER — HYDRALAZINE HCL 25 MG PO TABS: 25.0000 mg | ORAL_TABLET | Freq: Two times a day (BID) | ORAL | 2 refills | Status: DC

## 2020-07-27 MED ORDER — ASPIRIN EC 81 MG PO TBEC: 81.0000 mg | DELAYED_RELEASE_TABLET | Freq: Every day | ORAL | 3 refills | Status: DC

## 2020-07-27 NOTE — Patient Instructions (Addendum)
Medication Instructions:  Your physician has recommended you make the following change in your medication:   START Hydralazine 25mg  twice daily  RESUME Aspirin EC (enteric coated) 81 mg by mouth once a day.  *If you need a refill on your cardiac medications before your next appointment, please call your pharmacy*   Lab Work: None ordered today.   Testing/Procedures: Your EKG today shows normal sinus rhythm.    Follow-Up: At [facility], you and your health needs are our priority.  As part of our continuing mission to provide you with exceptional heart care, we have created designated Provider Care Teams.  These Care Teams include your primary Cardiologist (physician) and Advanced Practice Providers (APPs -  Physician Assistants and Nurse Practitioners) who all work together to provide you with the care you need, when you need it.  We recommend signing up for the patient portal called "MyChart".  Sign up information is provided on this After Visit Summary.  MyChart is used to connect with patients for Virtual Visits (Telemedicine).  Patients are able to view lab/test results, encounter notes, upcoming appointments, etc.  Non-urgent messages can be sent to your provider as well.   To learn more about what you can do with MyChart, go to [email or web address].    Your next appointment:   6 month(s)  The format for your next appointment:   In Person  Provider:   You may see Kathlyn Sacramento, MD or one of the following Advanced Practice Providers on your designated Care Team:    Murray Hodgkins, NP  Christell Faith, PA-C  Marrianne Mood, PA-C  Cadence Kathlen Mody, [place]  Laurann Montana, NP   Other Instructions  Tips to Measure your Blood Pressure Correctly National and international guidelines offer specific instructions for measuring blood pressure. If a doctor, nurse, or medical assistant isn't doing it right, don't hesitate to ask him or her to get with the guidelines.  Here's  what you can do to ensure a correct reading: . Don't drink a caffeinated beverage or smoke during the 30 minutes before the test. . Sit quietly for five minutes before the test begins. . During the measurement, sit in a chair with your feet on the floor and your arm supported so your elbow is at about heart level. . The inflatable part of the cuff should completely cover at least 80% of your upper arm, and the cuff should be placed on bare skin, not over a shirt. . Don't talk during the measurement. . Have your blood pressure measured twice, with a brief break in between. If the readings are different by 5 points or more, have it done a third time.  In 2017, new guidelines from the [place], the [employer] of Cardiology, and nine other health organizations lowered the diagnosis of high blood pressure to 130/80 mm Hg or higher for all adults. The guidelines also redefined the various blood pressure categories to now include normal, elevated, Stage 1 hypertension, Stage 2 hypertension, and hypertensive crisis (see "Blood pressure categories").  Blood pressure categories  Blood pressure category SYSTOLIC (upper number)  DIASTOLIC (lower number)  Normal Less than 120 mm Hg and Less than 80 mm Hg  Elevated 120-129 mm Hg and Less than 80 mm Hg  High blood pressure: Stage 1 hypertension 130-139 mm Hg or 80-89 mm Hg  High blood pressure: Stage 2 hypertension 140 mm Hg or higher or 90 mm Hg or higher  Hypertensive crisis (consult your doctor immediately) Higher than 180  mm Hg and/or Higher than 120 mm Hg  Source: American Heart Association and American Stroke Association. For more on getting your blood pressure under control, buy Controlling Your Blood Pressure, a Special Health Report from [facility].   Blood Pressure Log   Date   Time  Blood Pressure  Position  Example: Nov 1 9 AM 124/78 sitting

## 2020-08-16 ENCOUNTER — Telehealth: Payer: Self-pay | Admitting: Family

## 2020-08-16 NOTE — Telephone Encounter (Signed)
Called to check on Troy Chavez blood pressure after the addition of Hydralazine 25mg  BID at clinic visit 07/27/20.   Called home phone which rings with no option to leave a VM.  Called mobile phone and was able to reach him. Reports SBP on home monitoring is consistently <130.  Denies lightheadedness, dizziness, near-syncope, syncope.  Reports he is feeling overall well.  He verbalized understanding contact our office if his blood pressure becomes greater than 130/80.  Troy Dubonnet, NP

## 2021-03-13 ENCOUNTER — Other Ambulatory Visit: Payer: Self-pay | Admitting: Cardiovascular Disease

## 2021-03-13 DIAGNOSIS — E785 Hyperlipidemia, unspecified: Secondary | ICD-10-CM

## 2021-04-01 ENCOUNTER — Encounter: Payer: Self-pay | Admitting: Emergency Medicine

## 2021-04-01 ENCOUNTER — Emergency Department
Admission: EM | Admit: 2021-04-01 | Discharge: 2021-04-01 | Disposition: A | Discharge status: Home / Self Care | Payer: Medicare Other | Attending: Emergency Medicine | Admitting: Emergency Medicine

## 2021-04-01 ENCOUNTER — Other Ambulatory Visit: Payer: Self-pay

## 2021-04-01 ENCOUNTER — Emergency Department: Payer: Medicare Other

## 2021-04-01 DIAGNOSIS — I129 Hypertensive chronic kidney disease with stage 1 through stage 4 chronic kidney disease, or unspecified chronic kidney disease: Secondary | ICD-10-CM | POA: Insufficient documentation

## 2021-04-01 DIAGNOSIS — G309 Alzheimer's disease, unspecified: Secondary | ICD-10-CM | POA: Diagnosis not present

## 2021-04-01 DIAGNOSIS — Z87891 Personal history of nicotine dependence: Secondary | ICD-10-CM | POA: Insufficient documentation

## 2021-04-01 DIAGNOSIS — I2510 Atherosclerotic heart disease of native coronary artery without angina pectoris: Secondary | ICD-10-CM | POA: Insufficient documentation

## 2021-04-01 DIAGNOSIS — N433 Hydrocele, unspecified: Secondary | ICD-10-CM | POA: Diagnosis not present

## 2021-04-01 DIAGNOSIS — R1031 Right lower quadrant pain: Secondary | ICD-10-CM

## 2021-04-01 DIAGNOSIS — Z79899 Other long term (current) drug therapy: Secondary | ICD-10-CM | POA: Diagnosis not present

## 2021-04-01 DIAGNOSIS — N184 Chronic kidney disease, stage 4 (severe): Secondary | ICD-10-CM | POA: Insufficient documentation

## 2021-04-01 DIAGNOSIS — Z7982 Long term (current) use of aspirin: Secondary | ICD-10-CM | POA: Diagnosis not present

## 2021-04-01 DIAGNOSIS — R1030 Lower abdominal pain, unspecified: Secondary | ICD-10-CM | POA: Diagnosis present

## 2021-04-01 LAB — CBC WITH DIFFERENTIAL/PLATELET
Abs Immature Granulocytes: 0.02 10*3/uL (ref 0.00–0.07)
Basophils Absolute: 0.1 10*3/uL (ref 0.0–0.1)
Basophils Relative: 1 %
Eosinophils Absolute: 0.2 10*3/uL (ref 0.0–0.5)
Eosinophils Relative: 3 %
HCT: 33.3 % — ABNORMAL LOW (ref 39.0–52.0)
Hemoglobin: 10.9 g/dL — ABNORMAL LOW (ref 13.0–17.0)
Immature Granulocytes: 0 %
Lymphocytes Relative: 18 %
Lymphs Abs: 1 10*3/uL (ref 0.7–4.0)
MCH: 30.6 pg (ref 26.0–34.0)
MCHC: 32.7 g/dL (ref 30.0–36.0)
MCV: 93.5 fL (ref 80.0–100.0)
Monocytes Absolute: 0.7 10*3/uL (ref 0.1–1.0)
Monocytes Relative: 13 %
Neutro Abs: 3.7 10*3/uL (ref 1.7–7.7)
Neutrophils Relative %: 65 %
Platelets: 218 10*3/uL (ref 150–400)
RBC: 3.56 MIL/uL — ABNORMAL LOW (ref 4.22–5.81)
RDW: 12.5 % (ref 11.5–15.5)
WBC: 5.7 10*3/uL (ref 4.0–10.5)
nRBC: 0 % (ref 0.0–0.2)

## 2021-04-01 LAB — COMPREHENSIVE METABOLIC PANEL
ALT: 11 U/L (ref 0–44)
AST: 14 U/L — ABNORMAL LOW (ref 15–41)
Albumin: 3.9 g/dL (ref 3.5–5.0)
Alkaline Phosphatase: 71 U/L (ref 38–126)
Anion gap: 7 (ref 5–15)
BUN: 59 mg/dL — ABNORMAL HIGH (ref 8–23)
CO2: 21 mmol/L — ABNORMAL LOW (ref 22–32)
Calcium: 8.6 mg/dL — ABNORMAL LOW (ref 8.9–10.3)
Chloride: 109 mmol/L (ref 98–111)
Creatinine, Ser: 5.08 mg/dL — ABNORMAL HIGH (ref 0.61–1.24)
GFR, Estimated: 11 mL/min — ABNORMAL LOW (ref >60)
Glucose, Bld: 94 mg/dL (ref 70–99)
Potassium: 4.7 mmol/L (ref 3.5–5.1)
Sodium: 137 mmol/L (ref 135–145)
Total Bilirubin: 0.7 mg/dL (ref 0.3–1.2)
Total Protein: 7 g/dL (ref 6.5–8.1)

## 2021-04-01 LAB — URINALYSIS, COMPLETE (UACMP) WITH MICROSCOPIC
Bilirubin Urine: NEGATIVE
Glucose, UA: NEGATIVE mg/dL
Ketones, ur: NEGATIVE mg/dL
Leukocytes,Ua: NEGATIVE
Nitrite: NEGATIVE
Protein, ur: >300 mg/dL — AB
Specific Gravity, Urine: 1.015 (ref 1.005–1.030)
Squamous Epithelial / HPF: NONE SEEN (ref 0–5)
pH: 5.5 (ref 5.0–8.0)

## 2021-04-01 NOTE — ED Provider Notes (Signed)
[facility]  ____________________________________________   Event Date/Time   First MD Initiated Contact with Patient 04/01/21 1527     (approximate)  I have reviewed the triage vital signs and the nursing notes.   HISTORY  Chief Complaint Dysuria    HPI Troy Chavez is a 75 y.o. male with past medical history of CKD stage IV, hypertension, Alzheimer disease who presents with acute episode of groin swelling and pain.  Onset of symptoms is about 2 and half hours prior to arrival.  Patient was in his usual state of health when he developed sudden pain in the right side of his groin.  He noticed a bulge in the inguinal region as well as testicle pain.  Lasted for several minutes and ultimately resolved.  He denies any difficulty urinating or pain with urination.  Denies abdominal pain.  Did have nausea at the time but denies vomiting.  No fevers or chills.  Has never had this before.  Never been told he had a hernia.         Past Medical History:  Diagnosis Date   Alzheimer's disease ([place])    CKD (chronic kidney disease), stage IV ([place])    Hypertension     Patient Active Problem List   Diagnosis Date Noted   Coronary artery disease involving native coronary artery of native heart without angina pectoris 06/09/2019   Acute ST elevation myocardial infarction (STEMI) of lateral wall ([place]) 05/28/2017   STEMI (ST elevation myocardial infarction) ([place]) 05/28/2017   Chest pain 05/06/2017   CKD (chronic kidney disease), stage IV ([place]) 05/06/2017   Essential hypertension 05/06/2017   Alzheimer disease ([place]) 05/06/2017   GERD (gastroesophageal reflux disease) 05/06/2017   Benign essential hypertension 05/26/2016   Hyperlipidemia LDL goal <70 07/06/2014    Past Surgical History:  Procedure Laterality Date   CARDIAC CATHETERIZATION     CORONARY/GRAFT ACUTE MI REVASCULARIZATION N/A 05/28/2017   Procedure: Coronary/Graft Acute MI Revascularization;   Surgeon: Wellington Hampshire, MD;  Location: [facility];  Service: Cardiovascular;  Laterality: N/A;   LEFT HEART CATH AND CORONARY ANGIOGRAPHY N/A 05/28/2017   Procedure: LEFT HEART CATH AND CORONARY ANGIOGRAPHY;  Surgeon: Wellington Hampshire, MD;  Location: [facility];  Service: Cardiovascular;  Laterality: N/A;    Prior to Admission medications   Medication Sig Start Date End Date Taking? Authorizing Provider  aspirin EC 81 MG tablet Take 1 tablet (81 mg total) by mouth daily. Swallow whole. 07/27/20   Loel Dubonnet, NP  atorvastatin (LIPITOR) 80 MG tablet Take 1 tablet (80 mg total) by mouth daily at 6 PM. 05/30/17   Vaughan Basta, MD  colchicine 0.6 MG tablet Take 0.6 mg by mouth daily as needed.  11/04/18   [provider]  ezetimibe (ZETIA) 10 MG tablet TAKE 1 TABLET(10 MG) BY MOUTH DAILY 03/15/21   Wellington Hampshire, MD  hydrALAZINE (APRESOLINE) 25 MG tablet Take 1 tablet (25 mg total) by mouth in the morning and at bedtime. 07/27/20 10/25/20  Loel Dubonnet, NP  meclizine (ANTIVERT) 25 MG tablet Take 25 mg by mouth 3 (three) times daily as needed for dizziness.    [provider]  pantoprazole (PROTONIX) 40 MG tablet Take 1 tablet (40 mg total) by mouth daily. 05/08/17   Max Sane, MD  rivastigmine (EXELON) 9.5 mg/24hr Place 9.5 mg onto the skin as needed.  01/29/19 07/27/20  [provider]  SUMAtriptan (IMITREX) 50 MG tablet Take  50 mg by mouth every 2 (two) hours as needed for migraine.  09/27/18   [provider]  tamsulosin (FLOMAX) 0.4 MG CAPS capsule Take 0.4 mg by mouth daily. 05/06/17   [provider]    Allergies Codeine  Family History  Problem Relation Age of Onset   Dementia Mother    Rheum arthritis Father     Social History Social History   Tobacco Use   Smoking status: Former   Smokeless tobacco: Never   Tobacco comments:    Quit 35 yeaers ago  Scientific laboratory technician Use: Never used   Substance Use Topics   Alcohol use: Yes    Comment: rare drinking for special occasions   Drug use: No    Review of Systems   Review of Systems  Constitutional:  Negative for chills and fever.  Gastrointestinal:  Positive for nausea. Negative for abdominal pain and vomiting.  Genitourinary:  Positive for testicular pain. Negative for difficulty urinating, dysuria, hematuria and scrotal swelling.  All other systems reviewed and are negative.  Physical Exam Updated Vital Signs BP (!) 199/105 (BP Location: Left Arm)   Pulse 65   Temp 98.7 F (37.1 C) (Oral)   Resp 16   SpO2 99%   Physical Exam Vitals and nursing note reviewed.  Constitutional:      General: He is not in acute distress.    Appearance: Normal appearance.  HENT:     Head: Normocephalic and atraumatic.  Eyes:     General: No scleral icterus.    Conjunctiva/sclera: Conjunctivae normal.  Pulmonary:     Effort: Pulmonary effort is normal. No respiratory distress.     Breath sounds: Normal breath sounds. No wheezing.  Abdominal:     General: Abdomen is flat. There is no distension.     Palpations: Abdomen is soft.     Tenderness: There is no abdominal tenderness. There is no guarding.  Genitourinary:    Comments: No appreciable inguinal hernia, testicles are nontender, no scrotal swelling Musculoskeletal:        General: No deformity or signs of injury.     Cervical back: Normal range of motion.  Skin:    Coloration: Skin is not jaundiced or pale.  Neurological:     General: No focal deficit present.     Mental Status: He is alert and oriented to person, place, and time. Mental status is at baseline.  Psychiatric:        Mood and Affect: Mood normal.        Behavior: Behavior normal.     LABS (all labs ordered are listed, but only abnormal results are displayed)  Labs Reviewed  CBC WITH DIFFERENTIAL/PLATELET - Abnormal; Notable for the following components:      Result Value   RBC 3.56 (*)     Hemoglobin 10.9 (*)    HCT 33.3 (*)    All other components within normal limits  COMPREHENSIVE METABOLIC PANEL - Abnormal; Notable for the following components:   CO2 21 (*)    BUN 59 (*)    Creatinine, Ser 5.08 (*)    Calcium 8.6 (*)    AST 14 (*)    GFR, Estimated 11 (*)    All other components within normal limits  URINALYSIS, COMPLETE (UACMP) WITH MICROSCOPIC   ____________________________________________  EKG  N/a ____________________________________________  RADIOLOGY Almeta Monas, personally viewed and evaluated these images (plain radiographs) as part of my medical decision making, as well as  reviewing the written report by the radiologist.  ED MD interpretation: I reviewed the ultrasound of the scrotum which is negative for acute findings, small bilateral hydroceles    ____________________________________________   PROCEDURES  Procedure(s) performed (including Critical Care):  Procedures   ____________________________________________   INITIAL IMPRESSION / ASSESSMENT AND PLAN / ED COURSE   75 year old male presents with acute onset of groin swelling and pain that is now resolved.  His exam is rather unremarkable.  I do not appreciate any inguinal hernia there is no testicular swelling.  Given his testicular pain I did obtain a scrotal ultrasound which shows bilateral hydroceles but no torsion.  The rest of his labs are reassuring.  His creatinine is 5 but looking back in his care everywhere labs this is where its been he is aware of this.  Unclear what the exact etiology as this was a suppose it could have been a hernia that is now reduced but given there is no acute findings on exam no indication for further work-up at this time.  We discussed return precautions.    ____________________________________________   FINAL CLINICAL IMPRESSION(S) / ED DIAGNOSES  Final diagnoses:  Right inguinal pain     ED Discharge Orders     None         Note:  This document was prepared using Dragon voice recognition software and may include unintentional dictation errors.    Rada Hay, MD 04/01/21 [phone]

## 2021-04-01 NOTE — ED Triage Notes (Signed)
Pt to ED via POV, pt states that about 2.5 hours PTA he had some pressure when urinating. Pt also reports that he has knot above his groin, and pain. Pt is currently in NAD.

## 2021-04-01 NOTE — ED Notes (Signed)
EDPA at [facility] reviewing d/c instructions, pt alert, NAD, calm, interactive, resps e/u. Family present.

## 2021-04-17 IMAGING — US US RENAL
1 series · 14 of 25 positions shown · non-contrast
Comparison: CT abdomen pelvis February 03, 2019

CLINICAL DATA: Chronic renal disease

EXAM:
RENAL / URINARY TRACT ULTRASOUND COMPLETE

[Series 1: us renal · 0.23mm/px · 14 of 46 slices shown]
[im 1/46]
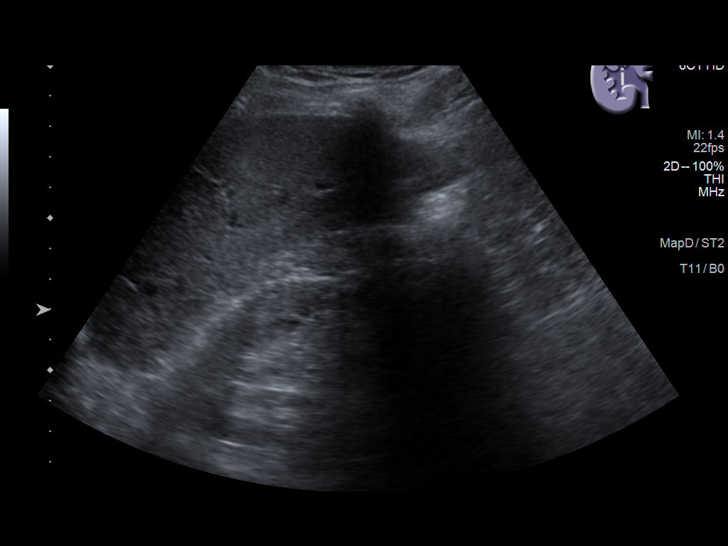
[im 4/46]
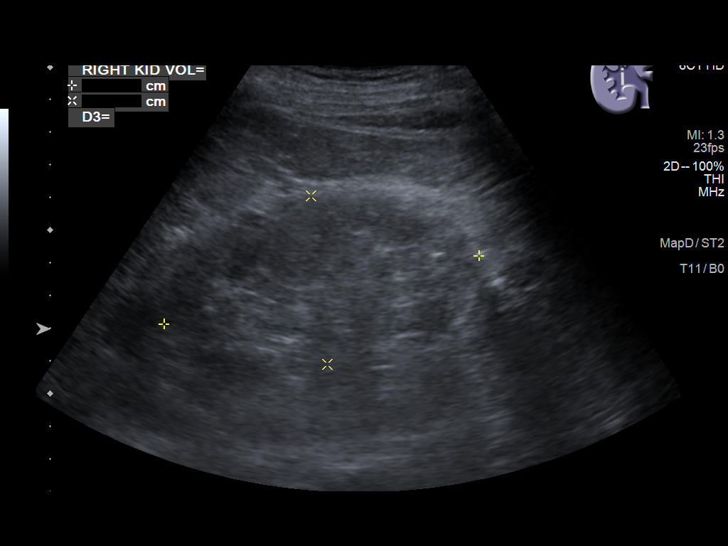
[im 8/46]
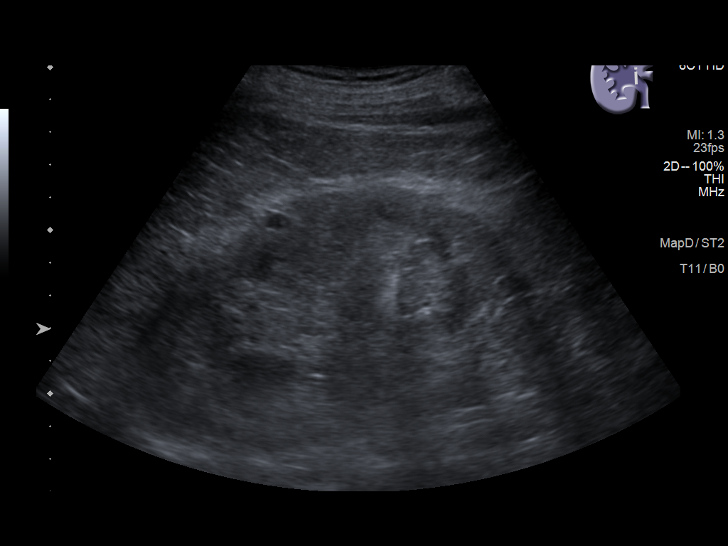
[im 12/46]
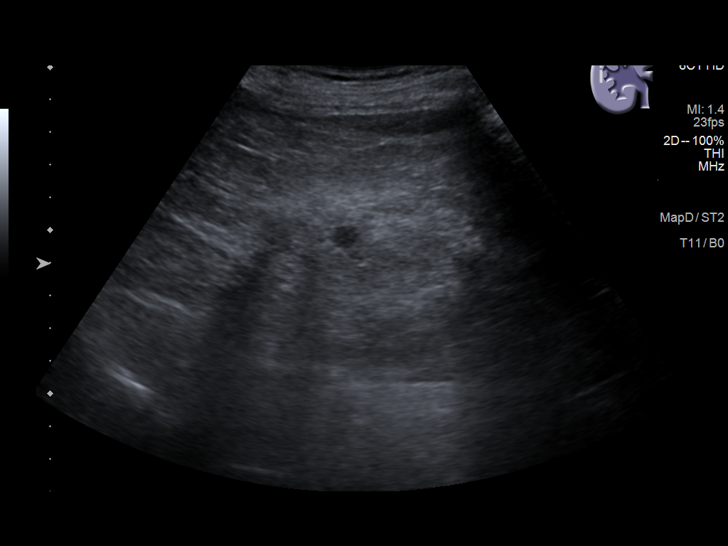
[im 16/46]
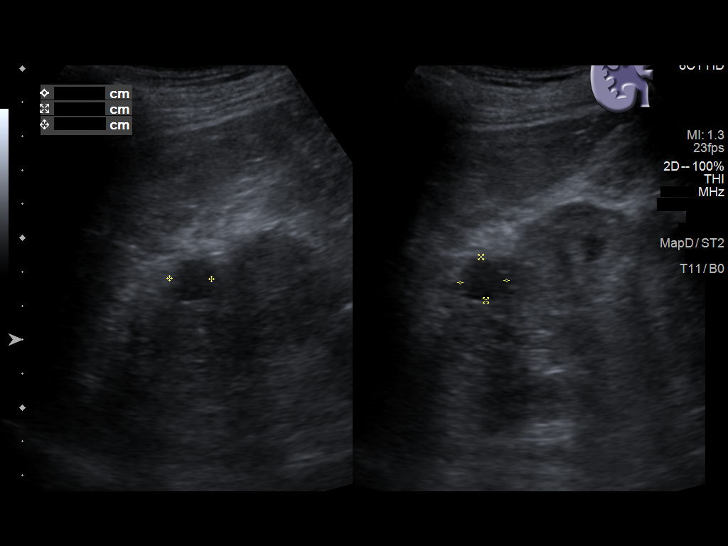
[im 17/46]
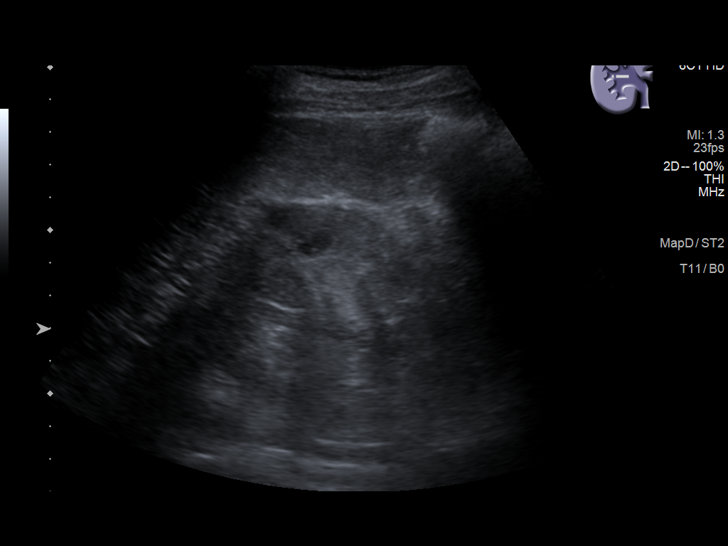
[im 21/46]
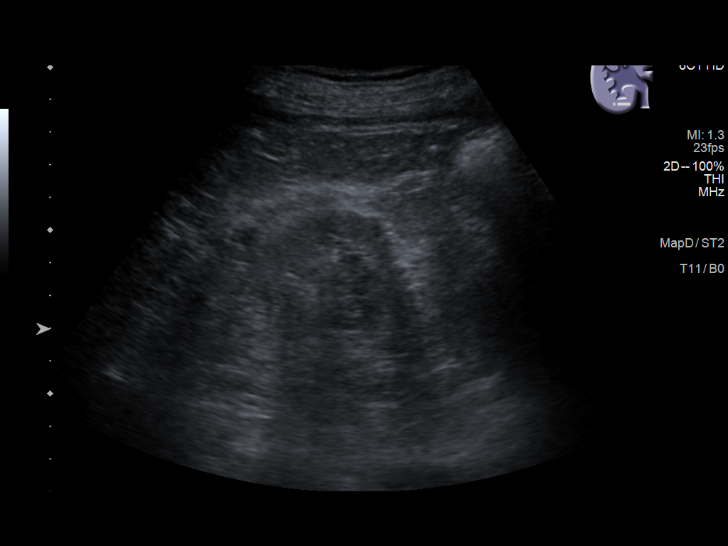
[im 25/46]
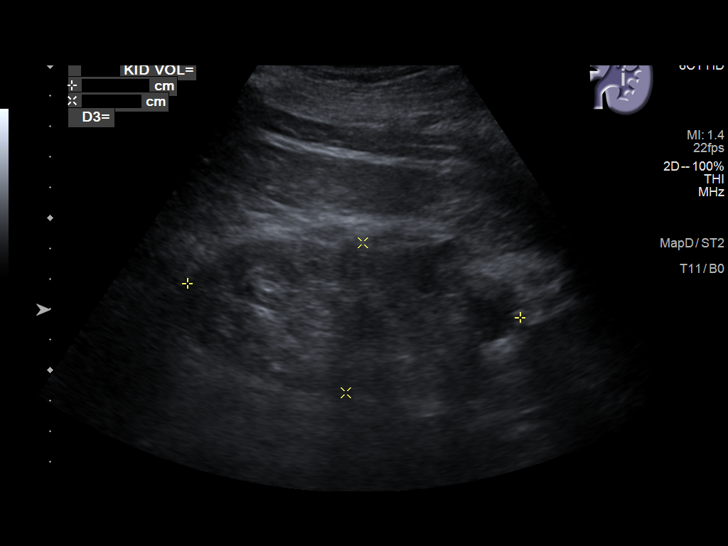
[im 29/46]
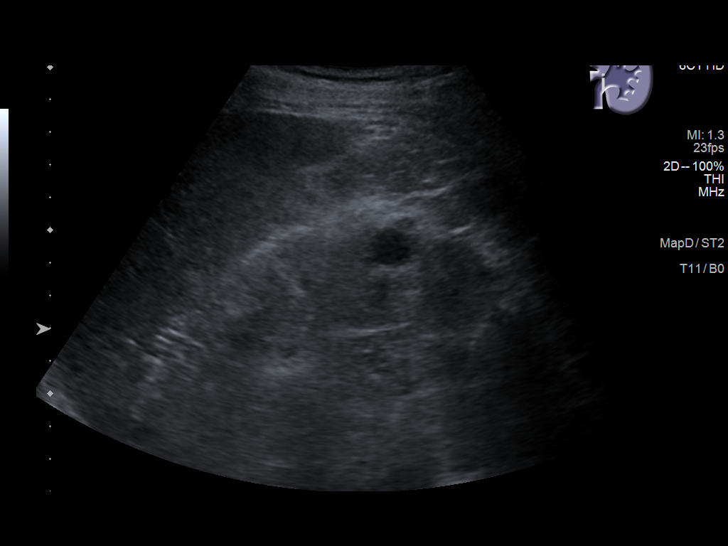
[im 31/46]
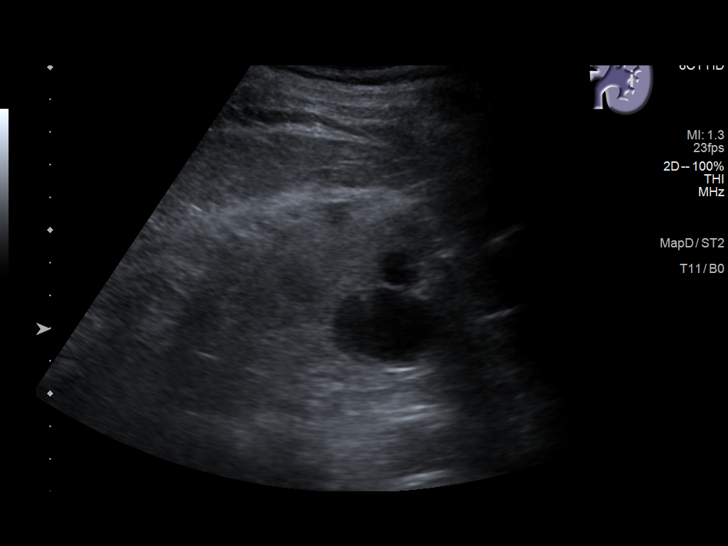
[im 34/46]
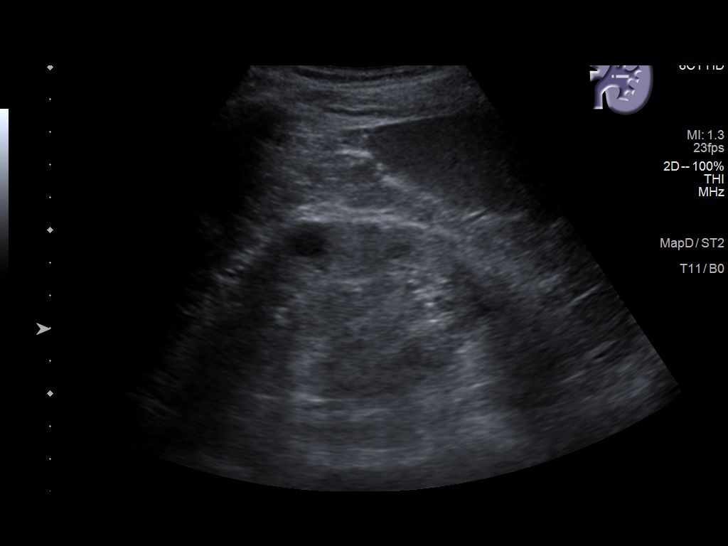
[im 38/46]
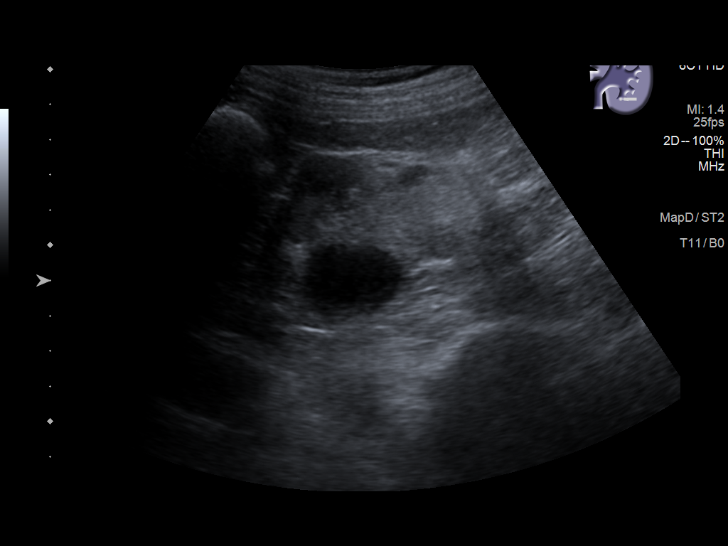
[im 42/46]
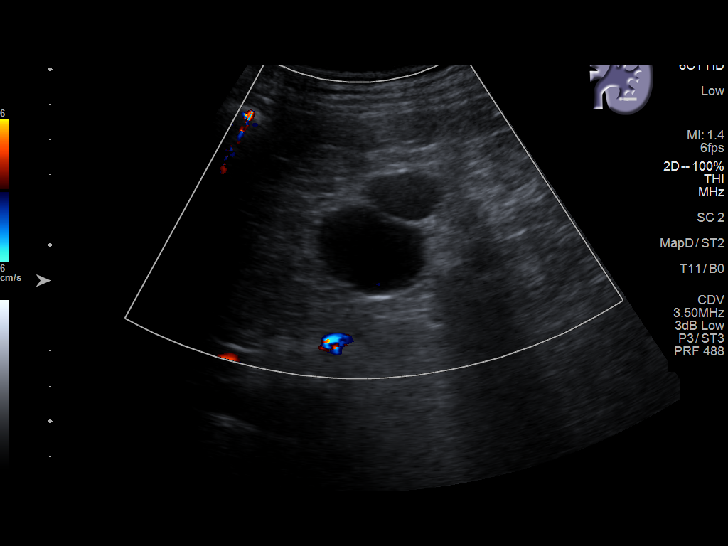
[im 46/46]
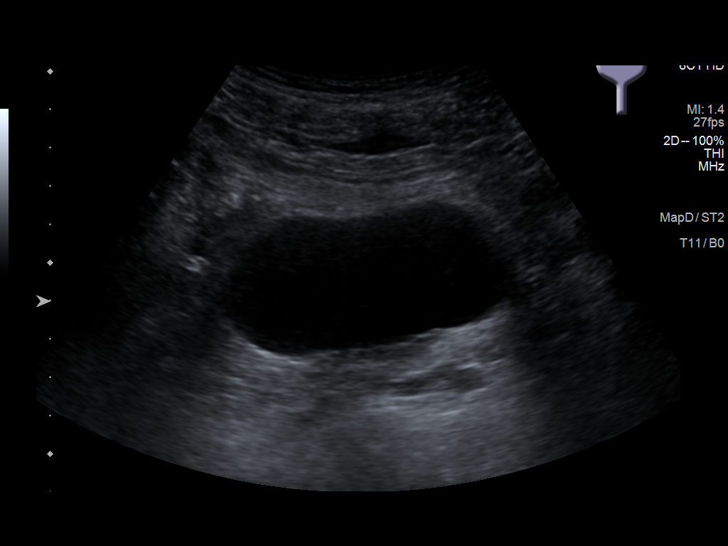

[14 of 25 positions shown; findings below may reference images not displayed]

FINDINGS: Right Kidney:

Renal measurements: 9.9 x 5.2 x 5.3 cm = volume: 140.7 mL. Renal
cortical thinning. Increased renal cortical echogenicity. No
hydronephrosis. Partially exophytic cyst superior pole right kidney.

Left Kidney:

Renal measurements: 11.0 x 5.0 x 5.8 cm = volume: 164.5 mL. Renal
cortical thinning. Increased renal cortical echogenicity. No
hydronephrosis. Multiple cysts.

Bladder:

Appears normal for degree of bladder distention.
IMPRESSION: No hydronephrosis.

Increased renal cortical echogenicity compatible with chronic
medical renal disease.

## 2021-05-15 ENCOUNTER — Encounter: Payer: Self-pay | Admitting: Emergency Medicine

## 2021-05-15 ENCOUNTER — Emergency Department
Admission: EM | Admit: 2021-05-15 | Discharge: 2021-05-15 | Disposition: A | Discharge status: Home / Self Care | Payer: Medicare Other | Attending: Emergency Medicine | Admitting: Emergency Medicine

## 2021-05-15 ENCOUNTER — Other Ambulatory Visit: Payer: Self-pay

## 2021-05-15 DIAGNOSIS — K4090 Unilateral inguinal hernia, without obstruction or gangrene, not specified as recurrent: Secondary | ICD-10-CM | POA: Insufficient documentation

## 2021-05-15 DIAGNOSIS — N50811 Right testicular pain: Secondary | ICD-10-CM | POA: Diagnosis present

## 2021-05-15 LAB — URINALYSIS, COMPLETE (UACMP) WITH MICROSCOPIC
Bacteria, UA: NONE SEEN
Bilirubin Urine: NEGATIVE
Glucose, UA: 50 mg/dL — AB
Ketones, ur: NEGATIVE mg/dL
Leukocytes,Ua: NEGATIVE
Nitrite: NEGATIVE
Protein, ur: >=300 mg/dL — AB
Specific Gravity, Urine: 1.015 (ref 1.005–1.030)
pH: 5 (ref 5.0–8.0)

## 2021-05-15 NOTE — ED Triage Notes (Signed)
Pt to ED via POV, pt states that about 1 hour PTA he was at church and started having pain in his right testicle. Pt states that pain is constant. Pt denies burning with urination. Pt states that he was sitting when the pain started. Pt denies redness of swelling. Pt family states that he has had similar episodes 3 other times. Pt is in NAD.

## 2021-05-15 NOTE — ED Provider Triage Note (Signed)
Emergency Medicine Provider Triage Evaluation Note  Troy Chavez , a 76 y.o. male  was evaluated in triage.  Pt complains of right groin pain.  He reports this started 1 hour prior to arrival.  He describes the pain as sharp and stabbing.  The pain radiates into his right testicle. He denies any redness, swelling, lesions, urinary urgency, frequency, dysuria or blood in his urine.  Has a history of chronic kidney disease.  Has had 3 episodes of the same since November.  Review of Systems  Positive: Right testicular pain Negative: Redness, swelling, lesions, urinary urgency, frequency, dysuria or blood in his urine, constipation, diarrhea  Physical Exam  Ht 5\' 9"  (1.753 m)    Wt 79.4 kg    BMI 25.84 kg/m  Gen:   Awake, no distress   Resp:  Normal effort  Abd:  Soft, tender in the RLQ, right inguinal area  Medical Decision Making  Medically screening exam initiated at 12:47 PM.  Appropriate orders placed.  Troy Chavez was informed that the remainder of the evaluation will be completed by another provider, this initial triage assessment does not replace that evaluation, and the importance of remaining in the ED until their evaluation is complete.  RLQ Pain, Right Inguinal Pain:  Urinalysis   Troy Fenton, NP 05/15/21 1251

## 2021-05-30 NOTE — ED Provider Notes (Signed)
° °  [facility] Provider Note    Event Date/Time   First MD Initiated Contact with Patient 05/15/21 1423     (approximate)   History   Testicle Pain   HPI  Troy Chavez is a 76 y.o. male who presents with complaints of right groin pain, that started proximally 1 hour prior to arrival.  He has had this several times in the last several months.  He reports pain is improved now.  Pain is in his right groin and radiates into his right testicle.     Physical Exam   Triage Vital Signs: ED Triage Vitals  Enc Vitals Group     BP 05/15/21 1247 (!) 204/95     Pulse Rate 05/15/21 1247 68     Resp 05/15/21 1247 16     Temp --      Temp src --      SpO2 05/15/21 1247 95 %     Weight 05/15/21 1246 79.4 kg (175 lb)     Height 05/15/21 1246 1.753 m (5\' 9" )     Head Circumference --      Peak Flow --      Pain Score 05/15/21 1245 7     Pain Loc --      Pain Edu? --      Excl. in [place]? --     Most recent vital signs: Vitals:   05/15/21 1247  BP: (!) 204/95  Pulse: 68  Resp: 16  SpO2: 95%     General: Awake, no distress.  CV:  Good peripheral perfusion.  Resp:  Normal effort.  Abd:  No distention.  Other:  Right testicle, no tenderness or swelling, suspect small hernia right groin   ED Results / Procedures / Treatments   Labs (all labs ordered are listed, but only abnormal results are displayed) Labs Reviewed  URINALYSIS, COMPLETE (UACMP) WITH MICROSCOPIC - Abnormal; Notable for the following components:      Result Value   Color, Urine YELLOW (*)    APPearance CLEAR (*)    Glucose, UA 50 (*)    Hgb urine dipstick SMALL (*)    Protein, ur >=300 (*)    All other components within normal limits     EKG     RADIOLOGY Ultrasound reviewed by me, no acute abnormality    PROCEDURES:  Critical Care performed:   Procedures   MEDICATIONS ORDERED IN ED: Medications - No data to display   IMPRESSION / MDM / [place] / ED  COURSE  I reviewed the triage vital signs and the nursing notes.    Patient presents with groin pain, now resolved, he describes a bulging in his right groin occasionally with radiation to his right testicle, suspicious for hernia.  Mildly palpable hernia on exam today  Urinalysis is overall reassuring no evidence of infection.  Ultrasound without significant abnormality.  Refer the patient to general surgery for further evaluation        FINAL CLINICAL IMPRESSION(S) / ED DIAGNOSES   Final diagnoses:  Unilateral inguinal hernia without obstruction or gangrene, recurrence not specified     Rx / DC Orders   ED Discharge Orders     None        Note:  This document was prepared using Dragon voice recognition software and may include unintentional dictation errors.   Lavonia Drafts, MD 05/30/21 [phone]

## 2021-06-06 ENCOUNTER — Ambulatory Visit: Payer: Self-pay | Admitting: Surgery

## 2021-06-06 NOTE — H&P (Signed)
Subjective:   CC: Non-recurrent unilateral inguinal hernia without obstruction or gangrene [K40.90]  HPI: Troy Chavez is a 76 y.o. male who was referred by Yevonne Pax, MD for evaluation of above. Symptoms were first noted several years ago. Pain is intermittent and discomfort, confined to the right groin, without radiation. Associated with nothing, exacerbated by nothing Lump is reducible.   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  Past Surgical History:  Past Surgical History:  Procedure Laterality Date   COLONOSCOPY 11/21/13  repeat 5 yrs per [facility]   PILONIDAL CYST / SINUS EXCISION   Family History: family history includes Arthritis in an other family member; No Known Problems in his mother; Other in an other family member; Rheum arthritis in an other family member.  Social History: reports that he quit smoking about 43 years ago. His smoking use included cigarettes. He has a 7.50 pack-year smoking history. He has never used smokeless tobacco. He reports that he does not drink alcohol and does not use drugs.  Current Medications: has a current medication list which includes the following prescription(s): atorvastatin, colchicine, ezetimibe, hydralazine, memantine, pantoprazole, sumatriptan, tamsulosin, amlodipine, and meclizine.  Allergies:  Allergies as of 05/23/2021 - Reviewed 05/23/2021  Allergen Reaction Noted   Hydrocodone Dizziness 10/01/2013   ROS:  A 15 point review of systems was performed and pertinent positives and negatives noted in HPI  Objective:    BP (!) 195/116   Pulse 73   Ht 172.7 cm (5\' 8" )   Wt 75.3 kg (166 lb)   BMI 25.24 kg/m   Constitutional : Alert, cooperative, no distress  Lymphatics/Throat:  Supple, no lymphadenopathy  Respiratory: clear to auscultation bilaterally  Cardiovascular: regular rate and rhythm  Gastrointestinal: soft, non-tender; bowel sounds normal; no masses, no organomegaly. inguinal hernia noted. small, reducible, no overlying skin changes and right  Musculoskeletal: Steady gait and movement  Skin: Cool and moist, no surgical scars  Psychiatric: Normal affect, non-agitated, not confused    LABS:  n/a   RADS: n/a Assessment:    Non-recurrent unilateral inguinal hernia without obstruction or gangrene [K40.90]  Plan:    1. Non-recurrent unilateral inguinal hernia without obstruction or gangrene [K40.90]  Discussed the risk of surgery including recurrence, which can be up to 50% in the case of incisional or complex hernias, possible use of prosthetic materials (mesh) and the increased risk of mesh infxn if used, bleeding, chronic pain, post-op infxn, post-op SBO or ileus, and possible re-operation to address said risks. The risks of general anesthetic, if used, includes MI, CVA, sudden death or even reaction to anesthetic medications also discussed. Alternatives include continued observation. Benefits include possible symptom relief, prevention of incarceration, strangulation, enlargement in size over time, and the risk of emergency surgery in the face of strangulation.   Typical post-op recovery time of 3-5 days with 2 weeks of activity restrictions were also discussed.  ED return precautions given for sudden increase in pain, size of hernia with accompanying fever, nausea, and/or vomiting.  The patient verbalized understanding and all questions were answered to the patient's satisfaction.

## 2021-06-06 NOTE — H&P (View-Only) (Signed)
Subjective:   CC: Non-recurrent unilateral inguinal hernia without obstruction or gangrene [K40.90]  HPI: Troy Chavez is a 75 y.o. male who was referred by Yevonne Pax, MD for evaluation of above. Symptoms were first noted several years ago. Pain is intermittent and discomfort, confined to the right groin, without radiation. Associated with nothing, exacerbated by nothing Lump is reducible.   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  Past Surgical History:  Past Surgical History:  Procedure Laterality Date   COLONOSCOPY 11/21/13  repeat 5 yrs per [facility]   PILONIDAL CYST / SINUS EXCISION   Family History: family history includes Arthritis in an other family member; No Known Problems in his mother; Other in an other family member; Rheum arthritis in an other family member.  Social History: reports that he quit smoking about 43 years ago. His smoking use included cigarettes. He has a 7.50 pack-year smoking history. He has never used smokeless tobacco. He reports that he does not drink alcohol and does not use drugs.  Current Medications: has a current medication list which includes the following prescription(s): atorvastatin, colchicine, ezetimibe, hydralazine, memantine, pantoprazole, sumatriptan, tamsulosin, amlodipine, and meclizine.  Allergies:  Allergies as of 05/23/2021 - Reviewed 05/23/2021  Allergen Reaction Noted   Hydrocodone Dizziness 10/01/2013   ROS:  A 15 point review of systems was performed and pertinent positives and negatives noted in HPI  Objective:    BP (!) 195/116   Pulse 73   Ht 172.7 cm (5\' 8" )   Wt 75.3 kg (166 lb)   BMI 25.24 kg/m   Constitutional : Alert, cooperative, no distress  Lymphatics/Throat:  Supple, no lymphadenopathy  Respiratory: clear to auscultation bilaterally  Cardiovascular: regular rate and rhythm  Gastrointestinal: soft, non-tender; bowel sounds normal; no masses, no organomegaly. inguinal hernia noted. small, reducible, no overlying skin changes and right  Musculoskeletal: Steady gait and movement  Skin: Cool and moist, no surgical scars  Psychiatric: Normal affect, non-agitated, not confused    LABS:  n/a   RADS: n/a Assessment:    Non-recurrent unilateral inguinal hernia without obstruction or gangrene [K40.90]  Plan:    1. Non-recurrent unilateral inguinal hernia without obstruction or gangrene [K40.90]  Discussed the risk of surgery including recurrence, which can be up to 50% in the case of incisional or complex hernias, possible use of prosthetic materials (mesh) and the increased risk of mesh infxn if used, bleeding, chronic pain, post-op infxn, post-op SBO or ileus, and possible re-operation to address said risks. The risks of general anesthetic, if used, includes MI, CVA, sudden death or even reaction to anesthetic medications also discussed. Alternatives include continued observation. Benefits include possible symptom relief, prevention of incarceration, strangulation, enlargement in size over time, and the risk of emergency surgery in the face of strangulation.   Typical post-op recovery time of 3-5 days with 2 weeks of activity restrictions were also discussed.  ED return precautions given for sudden increase in pain, size of hernia with accompanying fever, nausea, and/or vomiting.  The patient verbalized understanding and all questions were answered to the patient's satisfaction.

## 2021-06-16 ENCOUNTER — Other Ambulatory Visit: Payer: Self-pay

## 2021-06-16 ENCOUNTER — Telehealth: Payer: Self-pay | Admitting: *Deleted

## 2021-06-16 ENCOUNTER — Other Ambulatory Visit
Admission: RE | Admit: 2021-06-16 | Discharge: 2021-06-16 | Disposition: A | Discharge status: Home / Self Care | Payer: Medicare Other | Source: Ambulatory Visit | Attending: Surgery | Admitting: Surgery

## 2021-06-16 HISTORY — DX: Headache, unspecified: R51.9

## 2021-06-16 HISTORY — DX: Unspecified osteoarthritis, unspecified site: M19.90

## 2021-06-16 HISTORY — DX: Gastro-esophageal reflux disease without esophagitis: K21.9

## 2021-06-16 HISTORY — DX: Atherosclerotic heart disease of native coronary artery without angina pectoris: I25.10

## 2021-06-16 HISTORY — DX: Angina pectoris, unspecified: I20.9

## 2021-06-16 HISTORY — DX: Acute myocardial infarction, unspecified: I21.9

## 2021-06-16 NOTE — Patient Instructions (Addendum)
Your procedure is scheduled on: 06/23/21 - Thursday Report to the Registration Desk on the 1st floor of the [facility]. To find out your arrival time, please call [phone] between 1PM - 3PM on: 06/22/21 - Wednesday  REMEMBER: Instructions that are not followed completely may result in serious medical risk, up to and including death; or upon the discretion of your surgeon and anesthesiologist your surgery may need to be rescheduled.  Do not eat food after midnight the night before surgery.  No gum chewing, lozengers or hard candies.  You may however, drink CLEAR liquids up to 2 hours before you are scheduled to arrive for your surgery. Do not drink anything within 2 hours of your scheduled arrival time.  Clear liquids include: - water  - apple juice without pulp - gatorade (not RED, PURPLE, OR BLUE) - black coffee or tea (Do NOT add milk or creamers to the coffee or tea) Do NOT drink anything that is not on this list.  TAKE THESE MEDICATIONS THE MORNING OF SURGERY WITH A SIP OF WATER:  - amLODipine (NORVASC) 5 MG tablet - pantoprazole (PROTONIX) 40 MG tablet, (take one the night before and one on the morning of surgery - helps to prevent nausea after surgery.)  Follow recommendations from Cardiologist, Pulmonologist or PCP regarding stopping Aspirin, Coumadin, Plavix, Eliquis, Pradaxa, or Pletal. Patient reports that he is not taking Aspirin 81 ms as of 06/13/21.  One week prior to surgery: Stop Anti-inflammatories (NSAIDS) such as Advil, Aleve, Ibuprofen, Motrin, Naproxen, Naprosyn and Aspirin based products such as Excedrin, Goodys Powder, BC Powder.  Stop ANY OVER THE COUNTER supplements until after surgery.  You may take Tylenol if needed for pain up until the day of surgery.  No Alcohol for 24 hours before or after surgery.  No Smoking including e-cigarettes for 24 hours prior to surgery.  No chewable tobacco products for at least 6 hours prior to surgery.  No  nicotine patches on the day of surgery.  Do not use any "recreational" drugs for at least a week prior to your surgery.  Please be advised that the combination of cocaine and anesthesia may have negative outcomes, up to and including death. If you test positive for cocaine, your surgery will be cancelled.  On the morning of surgery brush your teeth with toothpaste and water, you may rinse your mouth with mouthwash if you wish. Do not swallow any toothpaste or mouthwash.  Use CHG Soap or wipes as directed on instruction sheet.  Do not wear jewelry, make-up, hairpins, clips or nail polish.  Do not wear lotions, powders, or perfumes.   Do not shave body from the neck down 48 hours prior to surgery just in case you cut yourself which could leave a site for infection.  Also, freshly shaved skin may become irritated if using the CHG soap.  Contact lenses, hearing aids and dentures may not be worn into surgery.  Do not bring valuables to the hospital. [facility] is not responsible for any missing/lost belongings or valuables.   Notify your doctor if there is any change in your medical condition (cold, fever, infection).  Wear comfortable clothing (specific to your surgery type) to the hospital.  After surgery, you can help prevent lung complications by doing breathing exercises.  Take deep breaths and cough every 1-2 hours. Your doctor may order a device called an Incentive Spirometer to help you take deep breaths. When coughing or sneezing, hold a pillow firmly against your incision  with both hands. This is called splinting. Doing this helps protect your incision. It also decreases belly discomfort.  If you are being admitted to the hospital overnight, leave your suitcase in the car. After surgery it may be brought to your room.  If you are being discharged the day of surgery, you will not be allowed to drive home. You will need a responsible adult (18 years or older) to drive you home  and stay with you that night.   If you are taking public transportation, you will need to have a responsible adult (18 years or older) with you. Please confirm with your physician that it is acceptable to use public transportation.   Please call the [facility] at [phone] if you have any questions about these instructions.  Surgery Visitation Policy:  Patients undergoing a surgery or procedure may have one family member or support person with them as long as that person is not COVID-19 positive or experiencing its symptoms.  That person may remain in the waiting area during the procedure and may rotate out with other people.  Inpatient Visitation:    Visiting hours are 7 a.m. to 8 p.m. Up to two visitors ages 16+ are allowed at one time in a patient room. The visitors may rotate out with other people during the day. Visitors must check out when they leave, or other visitors will not be allowed. One designated support person may remain overnight. The visitor must pass COVID-19 screenings, use hand sanitizer when entering and exiting the patients room and wear a mask at all times, including in the patients room. Patients must also wear a mask when staff or their visitor are in the room. Masking is required regardless of vaccination status.

## 2021-06-16 NOTE — Telephone Encounter (Signed)
Request for pre-operative cardiac clearance Received: Today Karen Kitchens, NP  P Cv Div Preop Callback Request for pre-operative cardiac clearance:     1. What type of surgery is being performed?  XI ROBOTIC ASSISTED INGUINAL HERNIA   2. When is this surgery scheduled?  06/23/2021     3. Type of clearance being requested (medical, pharmacy, both).  BOTH     4. Are there any medications that need to be held prior to surgery?  ASA   5. Practice name and name of physician performing surgery?  Performing surgeon: Dr. Benjamine Sprague, MD  Requesting clearance: Honor Loh, FNP-C       6. Anesthesia type (none, local, MAC, general)?  GENERAL   7. What is the office phone and fax number?    Phone: [phone]  Fax: [phone]   ATTENTION: Unable to create telephone message as per your standard workflow. Directed by [facility] providers to send requests for cardiac clearance to this pool for appropriate distribution to provider covering pre-operative clearances.   Honor Loh, MSN, APRN, FNP-C, CEN  [facility]  Peri-operative Services Nurse Practitioner  Phone: [phone]  06/16/21 9:29 AM

## 2021-06-16 NOTE — Telephone Encounter (Signed)
Call returned this morning to discuss surgical clearance for upcoming hernia surgery.  Patient unavailable message left to return call today.  I will try back again before the end of day.

## 2021-06-17 ENCOUNTER — Encounter: Payer: Self-pay | Admitting: Surgery

## 2021-06-17 NOTE — Progress Notes (Signed)
Perioperative Services  Pre-Admission/Anesthesia Testing Clinical Review  Date: 06/22/21  Patient Demographics:  Name: Troy Chavez DOB:   21-Mar-1946 MRN:   [id number]  Planned Surgical Procedure(s):    Case: [id number] Date/Time: 06/23/21 0830   Procedure: XI ROBOTIC ASSISTED INGUINAL HERNIA (Right: Groin)   Anesthesia type: General   Pre-op diagnosis: Non-recurrent unilateral inguinal hernia without obstruction or gangrene K40.90   Location: [facility] OR ROOM 06 / [facility]   Surgeons: Benjamine Sprague, DO   NOTE: Available PAT nursing documentation and vital signs have been reviewed. Clinical nursing staff has updated patient's PMH/PSHx, current medication list, and drug allergies/intolerances to ensure comprehensive history available to assist in medical decision making as it pertains to the aforementioned surgical procedure and anticipated anesthetic course. Extensive review of available clinical information performed. [place] PMH and PSHx updated with any diagnoses/procedures that  may have been inadvertently omitted during his intake with the pre-admission testing department's nursing staff.  Clinical Discussion:  Troy Chavez is a 76 y.o. male who is submitted for pre-surgical anesthesia review and clearance prior to him undergoing the above procedure. Patient is a Former Research scientist (life sciences). Pertinent PMH includes: CAD, lateral STEMI, angina, aortic root/ascending aorta dilation, diastolic dysfunction, HTN, HLD, CKD-V, GERD (on daily PPI), OA, BPH, Alzheimer's disease.  Patient is followed by cardiology Fletcher Anon, MD). He was last seen in the cardiology clinic on 06/22/2021; notes reviewed.  At the time of his clinic visit, patient doing well overall from a cardiovascular perspective.  He denied any chest pain, short of breath, PND, orthopnea, palpitations, vertiginous symptoms, or presyncope/syncope.  Patient with occasional BILATERAL ankle edema that resolved with elevation.   PMH significant for cardiovascular diagnoses.  Patient suffered a lateral wall STEMI on 06/05/2017.  Diagnostic left heart catheterization performed revealing a normal LVEDP.  Angiography of the was deferred due to patient's advanced CKD diagnosis.  There was multivessel CAD noted; 60% proximal LAD, 70% mid LAD, and 90% ostial ramus intermedius-ramus intermedius.  PCI was performed placing a 2.50 x 15 mm Sierra DES to the ramus intermedius.  Of note, cardiology noted to avoid RIGHT radial approach in the future due to severe tortuosity of the innominate artery.  TTE performed on 05/29/2017 revealed a mildly reduced left ventricular systolic function with an EF of 45-50%.  There was hypokinesis of the anterior and anterolateral myocardium.  Diastolic Doppler parameters impaired relaxation (G1DD).  Aortic root (40 mm) and ascending aorta (38 mm) mildly dilated.  Blood pressure elevated 180/84 on currently prescribed CCB + vasodilator therapies; attributed to pain caused by hernia. Patient is on a statin + ezetimibe for his HLD diagnosis. He is not diabetic. Functional capacity, as defined by DASI, is documented as being >/= 4 METS. Norvasc dose was increased to 10 mg. No other changes were made to his medication regimen.  Patient to follow-up with outpatient cardiology in 6 months or sooner if needed.  Troy Chavez is scheduled for an elective ROBOTIC ASSISTED RIGHT INGUINAL HERNIA REPAIR on 06/23/2021 with Dr. Benjamine Sprague, MD. Given patient's past medical history significant for cardiovascular diagnoses, presurgical cardiac clearance was sought by the PAT team.  Per cardiology, "RCRI 11%. Therefore, he is at [place] risk for perioperative complications.  His functional capacity is excellent at 7.04 METS according to DASI. According to ACC/AHA guidelines, no further cardiovascular testing needed".  In review of his medication reconciliation, it is noted the patient is not currently taking any type of  anticoagulation or  antiplatelet therapies that will need to be held during the perioperative period.  Patient denies previous perioperative complications with anesthesia in the past. In review of the EMR, there are no records available for review regarding patient's past surgical/anesthetic courses within the [facility] system.  Vitals with BMI 06/22/2021 05/15/2021 04/01/2021  Height 5\' 9"  5\' 9"  -  Weight 165 lbs 175 lbs -  BMI 29.47 65.46 -  Systolic 503 546 568  Diastolic 84 95 127  Pulse 66 68 65    Providers/Specialists:   NOTE: Primary physician provider listed below. Patient may have been seen by APP or partner within same practice.   PROVIDER ROLE / SPECIALTY LAST Shirline Frees, DO General Surgery 05/23/2021  Rusty Aus, MD Primary Care Provider 05/18/2021  Kathlyn Sacramento, MD Cardiology 06/22/2021  Murlean Iba, MD Nephrology ???   Allergies:  Codeine  Current Home Medications:   No current facility-administered medications for this encounter.    colchicine 0.6 MG tablet   meclizine (ANTIVERT) 25 MG tablet   pantoprazole (PROTONIX) 40 MG tablet   SUMAtriptan (IMITREX) 50 MG tablet   amLODipine (NORVASC) 10 MG tablet   atorvastatin (LIPITOR) 20 MG tablet   hydrALAZINE (APRESOLINE) 25 MG tablet   History:   Past Medical History:  Diagnosis Date   Alzheimer's disease ([place])    Anginal pain ([place])    Aortic dilatation ([place]) 05/29/2017   a.) TTE 05/29/2017: Aortic root measured 40 mm; ascending aorta measured 38 mm.   Arthritis    BPH (benign prostatic hyperplasia)    CKD (chronic kidney disease), stage V (HCC)    Coronary artery disease 05/28/2017   a.) lateral STEMI 05/28/2017 --> LHC: 60% pLAD, 70% mLAD, 517% oRI-RI   Diastolic dysfunction 00/17/4944   a.)  TTE: LVEF 45-50%; anterior and anterolateral HK; G1DD.   GERD (gastroesophageal reflux disease)    Gout    HLD (hyperlipidemia)    Hypertension    Ischemic cardiomyopathy 05/29/2017   a.) TTE  05/29/2017: EF 45-50%; anterior and anterolateral HK; G1DD.   Migraines    ST elevation myocardial infarction (STEMI) of lateral wall ([place]) 05/28/2017   a.) LHC 05/28/2017: LVEDP normal; angio deferred due to CKD-IV; 60% pLAD, 70% mLAD, 100% oRI-RI --> PCI placing a 2.50 x 15 mm Sierra DES to RI; avoid RIGHT radial approach in the future due to severe tortuosity of innominate artery.   Past Surgical History:  Procedure Laterality Date   CARDIAC CATHETERIZATION     COLONOSCOPY     CORONARY/GRAFT ACUTE MI REVASCULARIZATION N/A 05/28/2017   Procedure: Coronary/Graft Acute MI Revascularization;  Surgeon: Wellington Hampshire, MD;  Location: [facility];  Service: Cardiovascular;  Laterality: N/A;   EYE SURGERY     LEFT HEART CATH AND CORONARY ANGIOGRAPHY N/A 05/28/2017   Procedure: LEFT HEART CATH AND CORONARY ANGIOGRAPHY;  Surgeon: Wellington Hampshire, MD;  Location: [facility];  Service: Cardiovascular;  Laterality: N/A;   Family History  Problem Relation Age of Onset   Dementia Mother    Rheum arthritis Father    Social History   Tobacco Use   Smoking status: Former   Smokeless tobacco: Never   Tobacco comments:    Quit 35 yeaers ago  Vaping Use   Vaping Use: Never used  Substance Use Topics   Alcohol use: Yes    Comment: rare drinking for special occasions   Drug use: No    Pertinent Clinical Results:  LABS: Labs  reviewed: Acceptable for surgery.  Lab Results  Component Value Date   WBC 5.7 04/01/2021   HGB 10.9 (L) 04/01/2021   HCT 33.3 (L) 04/01/2021   MCV 93.5 04/01/2021   PLT 218 04/01/2021   Lab Results  Component Value Date   NA 137 04/01/2021   K 4.7 04/01/2021   CO2 21 (L) 04/01/2021   GLUCOSE 94 04/01/2021   BUN 59 (H) 04/01/2021   CREATININE 5.08 (H) 04/01/2021   CALCIUM 8.6 (L) 04/01/2021   GFRNONAA 11 (L) 04/01/2021    ECG: Date: 06/21/2021 Time ECG obtained: 1024 AM Rate: 64 bpm Rhythm: normal sinus Axis (leads I and aVF):  Normal Intervals: PR 166 ms. QRS 78 ms. QTc 406 ms. ST segment and T wave changes: No evidence of acute ST segment elevation or depression Comparison: Similar to previous tracing obtained on 07/27/2020   IMAGING / PROCEDURES: TRANSTHORACIC ECHOCARDIOGRAM performed on 05/29/2017 LVEF 45-50% Mildly reduced left ventricular systolic function Hypokinesis of the anterior and anterolateral myocardium Diastolic Doppler parameters consistent with abnormal left ventricular relaxation (G1DD). Aortic root mildly dilated at 40 mm Ascending aorta mildly dilated at 38 mm Right ventricular systolic function normal PASP in the range of 25-30 mmHg  LEFT HEART CATHETERIZATION AND CORONARY ANGIOGRAPHY performed on 05/28/2017 Lateral STEMI Multivessel CAD 60% stenosis of the proximal LAD 70% stenosis of the mid LAD 100% stenosis of the ostial ramus intermedius to ramus intermedius LVEDP normal Left ventricular angiography was not performed due to chronic kidney disease Successful PCI 2.50 x 15 mm Sierra DES placed to the ramus intermedius Recommendations DAPT therapy for at least 1 year Consider stress testing in about 6 weeks to evaluate for ischemia in the LAD distribution Obtain echocardiogram to evaluate LV function Hydration for 12 hours to decrease the chance of contrast-induced nephropathy.  A total of 110 mils of contrast was used. Avoid future catheterization via the RIGHT radial artery due to severe tortuosity of the innominate artery  Impression and Plan:  Troy Chavez has been referred for pre-anesthesia review and clearance prior to him undergoing the planned anesthetic and procedural courses. Available labs, pertinent testing, and imaging results were personally reviewed by me. This patient has been appropriately cleared by cardiology with an overall HIGH risk of significant perioperative cardiovascular complications.  Based on clinical review performed today (06/22/21), barring any  significant acute changes in the patient's overall condition, it is anticipated that he will be able to proceed with the planned surgical intervention. Any acute changes in clinical condition may necessitate his procedure being postponed and/or cancelled. Patient will meet with anesthesia team (MD and/or CRNA) on the day of his procedure for preoperative evaluation/assessment. Questions regarding anesthetic course will be fielded at that time.   Pre-surgical instructions were reviewed with the patient during his PAT appointment and questions were fielded by PAT clinical staff. Patient was advised that if any questions or concerns arise prior to his procedure then he should return a call to PAT and/or his surgeon's office to discuss.  Honor Loh, MSN, APRN, FNP-C, CEN [facility]  Peri-operative Services Nurse Practitioner Phone: [phone] Fax: [phone] 06/22/21 12:55 PM  NOTE: This note has been prepared using Dragon dictation software. Despite my best ability to proofread, there is always the potential that unintentional transcriptional errors may still occur from this process.

## 2021-06-20 NOTE — Telephone Encounter (Signed)
I s/w the pt's wife who is agreeable to plan of care for an appt fore pre op clearance. Pt's wife is aware the appt will be in our [facility] office [facility] location with Richardson Dopp, [facility] 06/22/21 @ 11:20 am. Pt's wife has been given the address for [facility]. I will forward clearance notes to [facility] for upcoming appt. Will send FYI to requesting office the pt has appt 06/22/21.

## 2021-06-20 NOTE — Telephone Encounter (Addendum)
° °  Name: EYOB GODLEWSKI  DOB: 1946/02/28  MRN: [id number]  Primary Cardiologist: Kathlyn Sacramento, MD  Chart reviewed as part of pre-operative protocol coverage. Because of Tres Grzywacz Cabanilla's past medical history and time since last visit, he will require a follow-up visit in order to better assess preoperative cardiovascular risk. ROMEO ZIELINSKI was last seen 07/2020 by Laurann Montana NP, primarily followed for history of CAD with prior MI 2019 with PCI at that time. At last visit, blood pressure was noted to be elevated so hydralazine added with recommendation to follow-up in 6 months. I do not see this appointment occurred. Notably last creatinine was 5.3 in 04/2021. Dementia also listed in chart, making phone clearance challenging. Initial consult for hernia repair took place 05/23/21. Blood pressure was 196/116 at that visit. Revised cardiac risk index is estimated at >11% indicating high risk at baseline. Needs f/u OV before clearing.   Pre-op covering staff: - Please schedule appointment and call patient to inform them.  - Please contact requesting surgeon's office via preferred method (i.e, phone, fax) to inform them of need for appointment prior to surgery.  Regarding antiplatelet, anticipate our recommendation will be to continue ASA perioperatively given cardiac hx and type of surgery. This can be clarified at time of OV.  Charlie Pitter, PA-C  06/20/2021, 11:47 AM

## 2021-06-20 NOTE — Telephone Encounter (Signed)
Patient's wife returning call. She says it is okay to leave a detailed message.

## 2021-06-20 NOTE — Telephone Encounter (Signed)
Pt will see Nicholes Rough, PA-C on 2/15.  I will fwd to her as FYI. Richardson Dopp, PA-C    06/20/2021 5:47 PM

## 2021-06-20 NOTE — Telephone Encounter (Signed)
Attempted to reach patient but no answer and unable to leave a voicemail.   Will send info to requesting physicians office that the patient may not be able to be cleared for surgery until he is cleared by cardiology.   Spoke with requesting office providers nurse and made her aware that we were unable to reach the patient. She states she will try to reach the patient as well.   We had an opening appt with Richardson Dopp on 06/22/2021 at 3:35 pm but the patient did not answer. Will offer to patient if he calls the office back.

## 2021-06-21 ENCOUNTER — Other Ambulatory Visit
Admission: RE | Admit: 2021-06-21 | Discharge: 2021-06-21 | Disposition: A | Discharge status: Home / Self Care | Payer: Medicare Other | Source: Ambulatory Visit | Attending: Surgery | Admitting: Surgery

## 2021-06-21 ENCOUNTER — Other Ambulatory Visit: Payer: Self-pay

## 2021-06-21 DIAGNOSIS — Z01810 Encounter for preprocedural cardiovascular examination: Secondary | ICD-10-CM | POA: Insufficient documentation

## 2021-06-21 NOTE — Progress Notes (Signed)
Office Visit    Patient Name: Troy Chavez Date of Encounter: 06/22/2021  PCP:  Rusty Aus, MD   [place]  Cardiologist:  Kathlyn Sacramento, MD  Advanced Practice Provider:  No care team member to display Electrophysiologist:  None    HPI    Troy Chavez is a 76 y.o. male with a hx of CAD, hypertension, hyperlipidemia, mild dementia, CKD IV presents today for pre-op clearance for robotic-assisted hernia repair.  Patient presented January 2019 with lateral STEMI.  Emergent cardiac cath with occluded ostial and proximal ramus intermedius and 60% stenosis proximal LAD and 70% stenosis mid LAD.  He underwent angioplasty and DES to ramus.  Noted difficult catheterization of right radial artery due to tortuosity.  Echocardiogram with LVEF 45 to 50%.  He is following with a hematologist for elevated kappa lambda light chain with normal ratio 1:4.  Renal ultrasound 02/10/2019 with cortical thinning and increased renal cortical echogenicity and multiple cysts.  Kidney biopsy 10/20 12/2018 with no evidence of immune complex mediated or active GN/renal disease.  He was seen in February 2021 and doing well from a cardiac perspective.  LDL was above goal.  He was seen by Dr. Sophronia Simas August 2021 and noted that lisinopril was discontinued due to creatinine 3.3.  Carvedilol had previously been discontinued due to bradycardia.  He was started on Zetia 10 mg daily at that time with recommendation to consider hydralazine if his BP remains elevated.  November 2021 labs showed LDL 62.  He was last seen March 2022 by Laurann Montana, NP.  His wife reported he had occasional bilateral ankle edema which resolved with elevation.  He was encouraged to maintain a low-sodium diet.  He reported no other cardiac symptoms.  He was monitoring his BP at home with readings usually greater than 570 systolic.   Today, he feels okay from a cardiovascular standpoint.  His blood pressure in the  office today is quite high, 180/84.  His wife states that when she took it at home the other week the top number was right around 180.  We discussed several options to get his blood pressure better controlled.  I also noticed that his most recent creatinine back in November was 5.08.  As of right now he does not follow with a nephrologist.  He denies any chest pain, shortness of breath, syncope, presyncope, dizziness/lightheadedness, or lower extremity edema.  His METS were well within range at 7.  He stays pretty active and can do pretty much anything that he wants to do.  He feels good today and is ready for his hernia surgery tomorrow.  It has caused him quite a bit of pain which is probably not helping with his blood pressure.  Reports no shortness of breath nor dyspnea on exertion. Reports no chest pain, pressure, or tightness. No edema, orthopnea, PND. Reports no palpitations.     Past Medical History    Past Medical History:  Diagnosis Date   Alzheimer's disease ([place])    Anginal pain ([place])    Aortic dilatation ([place]) 05/29/2017   a.) TTE 05/29/2017: Aortic root measured 40 mm; ascending aorta measured 38 mm.   Arthritis    BPH (benign prostatic hyperplasia)    CKD (chronic kidney disease), stage V (HCC)    Coronary artery disease 05/28/2017   a.) lateral STEMI 05/28/2017 --> LHC: 60% pLAD, 70% mLAD, 177% oRI-RI   Diastolic dysfunction 93/90/3009   a.)  TTE: LVEF 45-50%;  anterior and anterolateral HK; G1DD.   GERD (gastroesophageal reflux disease)    Gout    HLD (hyperlipidemia)    Hypertension    Ischemic cardiomyopathy 05/29/2017   a.) TTE 05/29/2017: EF 45-50%; anterior and anterolateral HK; G1DD.   Migraines    ST elevation myocardial infarction (STEMI) of lateral wall ([place]) 05/28/2017   a.) LHC 05/28/2017: LVEDP normal; angio deferred due to CKD-IV; 60% pLAD, 70% mLAD, 100% oRI-RI --> PCI placing a 2.50 x 15 mm Sierra DES to RI; avoid RIGHT radial approach in the future due to  severe tortuosity of innominate artery.   Past Surgical History:  Procedure Laterality Date   CARDIAC CATHETERIZATION     COLONOSCOPY     CORONARY/GRAFT ACUTE MI REVASCULARIZATION N/A 05/28/2017   Procedure: Coronary/Graft Acute MI Revascularization;  Surgeon: Wellington Hampshire, MD;  Location: [facility];  Service: Cardiovascular;  Laterality: N/A;   EYE SURGERY     LEFT HEART CATH AND CORONARY ANGIOGRAPHY N/A 05/28/2017   Procedure: LEFT HEART CATH AND CORONARY ANGIOGRAPHY;  Surgeon: Wellington Hampshire, MD;  Location: [facility];  Service: Cardiovascular;  Laterality: N/A;    Allergies  Allergies  Allergen Reactions   Codeine     headache    EKGs/Labs/Other Studies Reviewed:   The following studies were reviewed today:  Echo 05/29/17 Left ventricle: The cavity size was normal. Wall thickness was    normal. Systolic function was mildly reduced. The estimated    ejection fraction was in the range of 45% to 50%. There is    hypokinesis of the anterior and anterolateral myocardium. Doppler    parameters are consistent with abnormal left ventricular    relaxation (grade 1 diastolic dysfunction).  - Aortic root: The aortic root was mildly dilated.  - Ascending aorta: The ascending aorta was mildly dilated.  - Right ventricle: The cavity size was normal. Systolic function    was normal.  - Pulmonary arteries: Systolic pressure was within the normal    range, in the range of 25 mm Hg to 30 mm Hg.    Cath 05/28/17 Prox LAD lesion is 60% stenosed. Mid LAD lesion is 70% stenosed. Ost Ramus to Ramus lesion is 100% stenosed. A drug-eluting stent was successfully placed using a STENT SIERRA 2.50 X 15 MM. Post intervention, there is a 0% residual stenosis.   1.  Significant two-vessel coronary artery disease with thrombotic occlusion of ostial ramus which is the culprit for lateral ST elevation myocardial infarction.  There is borderline significant disease in the  proximal mid LAD at 60-70%. 2.  Normal left ventricular end-diastolic pressure.  Left ventricular angiography was not performed due to chronic kidney disease. 3.  Successful angioplasty and drug-eluting stent placement to the ostial ramus.   Recommendations: 1.  Dual antiplatelet therapy for at least one year. 2.  Consider stress testing in about 6 weeks to evaluate for ischemia in the LAD distribution 3.  Obtain an echocardiogram to evaluate LV systolic function. 4.  Hydration for 12 hours to decrease the chance of contrast-induced nephropathy.  A total of 110 mL of contrast was used. 5.  Avoid catheterization via the right radial artery in the future given severe tortuosity of the innominate artery.  EKG:  EKG is not ordered today.  The ekg ordered yesterday demonstrates NSR  Recent Labs: 04/01/2021: ALT 11; BUN 59; Creatinine, Ser 5.08; Hemoglobin 10.9; Platelets 218; Potassium 4.7; Sodium 137  Recent Lipid Panel    Component  Value Date/Time   CHOL 214 (H) 05/29/2017 0242   TRIG 82 05/29/2017 0242   HDL 52 05/29/2017 0242   CHOLHDL 4.1 05/29/2017 0242   VLDL 16 05/29/2017 0242   LDLCALC 146 (H) 05/29/2017 0242    Home Medications   Current Meds  Medication Sig   atorvastatin (LIPITOR) 20 MG tablet Take 20 mg by mouth daily.   colchicine 0.6 MG tablet Take 0.6 mg by mouth daily as needed (Gout).   meclizine (ANTIVERT) 25 MG tablet Take 25 mg by mouth 3 (three) times daily as needed for dizziness.   pantoprazole (PROTONIX) 40 MG tablet Take 1 tablet (40 mg total) by mouth daily.   SUMAtriptan (IMITREX) 50 MG tablet Take 50 mg by mouth every 2 (two) hours as needed for migraine.    [DISCONTINUED] amLODipine (NORVASC) 5 MG tablet Take 5 mg by mouth daily.     Review of Systems      All other systems reviewed and are otherwise negative except as noted above.  Physical Exam    VS:  BP (!) 180/84 (BP Location: Right Arm, Patient Position: Sitting, Cuff Size: Normal)    Pulse  66    Ht 5\' 9"  (1.753 m)    Wt 165 lb (74.8 kg)    SpO2 98%    BMI 24.37 kg/m  , BMI Body mass index is 24.37 kg/m.  Wt Readings from Last 3 Encounters:  06/22/21 165 lb (74.8 kg)  05/15/21 175 lb (79.4 kg)  07/27/20 181 lb (82.1 kg)     GEN: Well nourished, well developed, in no acute distress. HEENT: normal. Neck: Supple, no JVD, carotid bruits, or masses. Cardiac: RRR, no murmurs, rubs, or gallops. No clubbing, cyanosis, edema.  Radials/PT 2+ and equal bilaterally.  Respiratory:  Respirations regular and unlabored, clear to auscultation bilaterally. GI: Soft, nontender, nondistended. MS: No deformity or atrophy. Skin: Warm and dry, no rash. Neuro:  Strength and sensation are intact. Psych: Normal affect.  Assessment & Plan    Preop clearance Mr. Debruin perioperative risk of a major cardiac event is 11% according to the Revised Cardiac Risk Index (RCRI).  Therefore, he is at high risk for perioperative complications.   His functional capacity is excellent at 7.04 METs according to the Duke Activity Status Index (DASI). Recommendations: According to ACC/AHA guidelines, no further cardiovascular testing needed.  The patient may proceed to surgery at acceptable risk.    CAD -No chest pain -He is currently off aspirin  Mild ischemic cardiomyopathy -Euvolemic today on exam -asymptomatic  Hypertension -Elevated in the clinic today 180/84 -Increased Norvasc to 10mg  -Start back on hydralazine 25mg  BID -check BP daily for 2 weeks and please send me values via MyChart or phone -We may need to make further medication adjustments -Avoid ACEI/ARB due to renal function -Avoid BB due to hx bradycardia  Hyperlipidemia -His PCP manages. Last LDL 47 -Continue Lipitor 20mg  daily  CKD IV -Refer to nephrology -Last creatinine was 5.08   Disposition: Follow up Aug  with Kathlyn Sacramento, MD or APP.  Signed, Elgie Collard, PA-C 06/22/2021, 12:14 PM [facility]

## 2021-06-22 ENCOUNTER — Encounter: Payer: Self-pay | Admitting: Physician Assistant

## 2021-06-22 ENCOUNTER — Ambulatory Visit: Payer: Medicare Other | Admitting: Physician Assistant

## 2021-06-22 VITALS — BP 180/84 | HR 66 | Ht 69.0 in | Wt 165.0 lb

## 2021-06-22 DIAGNOSIS — I255 Ischemic cardiomyopathy: Secondary | ICD-10-CM | POA: Diagnosis not present

## 2021-06-22 DIAGNOSIS — E785 Hyperlipidemia, unspecified: Secondary | ICD-10-CM | POA: Diagnosis not present

## 2021-06-22 DIAGNOSIS — I10 Essential (primary) hypertension: Secondary | ICD-10-CM

## 2021-06-22 DIAGNOSIS — I2510 Atherosclerotic heart disease of native coronary artery without angina pectoris: Secondary | ICD-10-CM | POA: Diagnosis not present

## 2021-06-22 DIAGNOSIS — N184 Chronic kidney disease, stage 4 (severe): Secondary | ICD-10-CM | POA: Diagnosis not present

## 2021-06-22 MED ORDER — AMLODIPINE BESYLATE 10 MG PO TABS: 10.0000 mg | ORAL_TABLET | Freq: Every day | ORAL | 11 refills | Status: DC

## 2021-06-22 MED ORDER — ACETAMINOPHEN 500 MG PO TABS: 1000.0000 mg | ORAL_TABLET | ORAL | Status: AC

## 2021-06-22 MED ORDER — CHLORHEXIDINE GLUCONATE CLOTH 2 % EX PADS: 6.0000 | MEDICATED_PAD | Freq: Once | CUTANEOUS | Status: DC

## 2021-06-22 MED ORDER — LACTATED RINGERS IV SOLN
INTRAVENOUS | Status: DC
Start: 2021-06-22 — End: 2021-06-23

## 2021-06-22 MED ORDER — CEFAZOLIN SODIUM-DEXTROSE 2-4 GM/100ML-% IV SOLN
2.0000 g | INTRAVENOUS | Status: AC
  Administered 2021-06-23: 2 g via INTRAVENOUS

## 2021-06-22 MED ORDER — CHLORHEXIDINE GLUCONATE 0.12 % MT SOLN: 15.0000 mL | Freq: Once | OROMUCOSAL | Status: AC

## 2021-06-22 MED ORDER — CELECOXIB 200 MG PO CAPS: 200.0000 mg | ORAL_CAPSULE | ORAL | Status: AC

## 2021-06-22 MED ORDER — HYDRALAZINE HCL 25 MG PO TABS: 25.0000 mg | ORAL_TABLET | Freq: Two times a day (BID) | ORAL | 11 refills | Status: DC

## 2021-06-22 MED ORDER — ORAL CARE MOUTH RINSE: 15.0000 mL | Freq: Once | OROMUCOSAL | Status: AC

## 2021-06-22 NOTE — Patient Instructions (Signed)
Medication Instructions:   INCREASE Amlodipine one (1) tablet by mouth ( 10 mg ) daily.  Can use up two (2) for your (5 mg ) tablets to take at the same time.  START  Hydralazine one (1) tablet by mouth ( 25 mg) twice daily.   *If you need a refill on your cardiac medications before your next appointment, please call your pharmacy*   Follow-Up: At [facility], you and your health needs are our priority.  As part of our continuing mission to provide you with exceptional heart care, we have created designated Provider Care Teams.  These Care Teams include your primary Cardiologist (physician) and Advanced Practice Providers (APPs -  Physician Assistants and Nurse Practitioners) who all work together to provide you with the care you need, when you need it.  We recommend signing up for the patient portal called "MyChart".  Sign up information is provided on this After Visit Summary.  MyChart is used to connect with patients for Virtual Visits (Telemedicine).  Patients are able to view lab/test results, encounter notes, upcoming appointments, etc.  Non-urgent messages can be sent to your provider as well.   To learn more about what you can do with MyChart, go to [email or web address].    Your next appointment:   6 month(s)  The format for your next appointment:   In Person  Provider:   Kathlyn Sacramento, MD    Other Instructions  Please send in blood pressure readings X 2 weeks either mychart or call office.   Blood Pressure Record Sheet To take your blood pressure, you will need a blood pressure machine. You can buy a blood pressure machine (blood pressure monitor) at your clinic, drug store, or online. When choosing one, consider: An automatic monitor that has an arm cuff. A cuff that wraps snugly around your upper arm. You should be able to fit only one finger between your arm and the cuff. A device that stores blood pressure reading results. Do not choose a monitor that measures  your blood pressure from your wrist or finger. Follow your health care provider's instructions for how to take your blood pressure. To use this form: Get one reading in the morning (a.m.) before you take any medicines. Get one reading in the evening (p.m.) before supper. Take at least 2 readings with each blood pressure check. This makes sure the results are correct. Wait 1-2 minutes between measurements. Write down the results in the spaces on this form. Repeat this once a week, or as told by your health care provider. Make a follow-up appointment with your health care provider to discuss the results. Blood pressure log Date: _______________________ a.m. _____________________(1st reading) _____________________(2nd reading) p.m. _____________________(1st reading) _____________________(2nd reading) Date: _______________________ a.m. _____________________(1st reading) _____________________(2nd reading) p.m. _____________________(1st reading) _____________________(2nd reading) Date: _______________________ a.m. _____________________(1st reading) _____________________(2nd reading) p.m. _____________________(1st reading) _____________________(2nd reading) Date: _______________________ a.m. _____________________(1st reading) _____________________(2nd reading) p.m. _____________________(1st reading) _____________________(2nd reading) Date: _______________________ a.m. _____________________(1st reading) _____________________(2nd reading) p.m. _____________________(1st reading) _____________________(2nd reading) This information is not intended to replace advice given to you by your health care provider. Make sure you discuss any questions you have with your health care provider. Document Revised: 08/12/2019 Document Reviewed: 08/13/2019 Elsevier Patient Education  [place].

## 2021-06-23 ENCOUNTER — Encounter
Admission: RE | Disposition: A | Discharge status: Home / Self Care | Payer: Self-pay | Source: Home / Self Care | Attending: Surgery

## 2021-06-23 ENCOUNTER — Ambulatory Visit
Admission: RE | Admit: 2021-06-23 | Discharge: 2021-06-23 | Disposition: A | Discharge status: Home / Self Care | Payer: Medicare Other | Attending: Surgery | Admitting: Surgery

## 2021-06-23 ENCOUNTER — Ambulatory Visit: Payer: Medicare Other | Admitting: Urgent Care

## 2021-06-23 ENCOUNTER — Other Ambulatory Visit: Payer: Self-pay

## 2021-06-23 ENCOUNTER — Encounter: Payer: Self-pay | Admitting: Surgery

## 2021-06-23 DIAGNOSIS — I120 Hypertensive chronic kidney disease with stage 5 chronic kidney disease or end stage renal disease: Secondary | ICD-10-CM | POA: Diagnosis not present

## 2021-06-23 DIAGNOSIS — I252 Old myocardial infarction: Secondary | ICD-10-CM | POA: Diagnosis not present

## 2021-06-23 DIAGNOSIS — N400 Enlarged prostate without lower urinary tract symptoms: Secondary | ICD-10-CM | POA: Diagnosis not present

## 2021-06-23 DIAGNOSIS — I2510 Atherosclerotic heart disease of native coronary artery without angina pectoris: Secondary | ICD-10-CM | POA: Diagnosis not present

## 2021-06-23 DIAGNOSIS — Z955 Presence of coronary angioplasty implant and graft: Secondary | ICD-10-CM | POA: Insufficient documentation

## 2021-06-23 DIAGNOSIS — K4090 Unilateral inguinal hernia, without obstruction or gangrene, not specified as recurrent: Secondary | ICD-10-CM | POA: Diagnosis not present

## 2021-06-23 DIAGNOSIS — N185 Chronic kidney disease, stage 5: Secondary | ICD-10-CM | POA: Diagnosis not present

## 2021-06-23 DIAGNOSIS — E785 Hyperlipidemia, unspecified: Secondary | ICD-10-CM | POA: Insufficient documentation

## 2021-06-23 DIAGNOSIS — Z87891 Personal history of nicotine dependence: Secondary | ICD-10-CM | POA: Diagnosis not present

## 2021-06-23 DIAGNOSIS — K219 Gastro-esophageal reflux disease without esophagitis: Secondary | ICD-10-CM | POA: Diagnosis not present

## 2021-06-23 DIAGNOSIS — G309 Alzheimer's disease, unspecified: Secondary | ICD-10-CM | POA: Diagnosis not present

## 2021-06-23 DIAGNOSIS — F0280 Dementia in other diseases classified elsewhere without behavioral disturbance: Secondary | ICD-10-CM | POA: Insufficient documentation

## 2021-06-23 DIAGNOSIS — M1990 Unspecified osteoarthritis, unspecified site: Secondary | ICD-10-CM | POA: Diagnosis not present

## 2021-06-23 HISTORY — PX: INSERTION OF MESH: SHX5868

## 2021-06-23 HISTORY — DX: Gout, unspecified: M10.9

## 2021-06-23 HISTORY — DX: Migraine, unspecified, not intractable, without status migrainosus: G43.909

## 2021-06-23 HISTORY — DX: Benign prostatic hyperplasia without lower urinary tract symptoms: N40.0

## 2021-06-23 HISTORY — DX: Chronic kidney disease, stage 5: N18.5

## 2021-06-23 HISTORY — DX: Hyperlipidemia, unspecified: E78.5

## 2021-06-23 SURGERY — HERNIORRHAPHY, INGUINAL, ROBOT-ASSISTED, LAPAROSCOPIC
Anesthesia: General | Site: Groin | Laterality: Right

## 2021-06-23 MED ORDER — SUGAMMADEX SODIUM 200 MG/2ML IV SOLN
INTRAVENOUS | Status: DC | PRN
Start: 2021-06-23 — End: 2021-06-23
  Administered 2021-06-23 (×2): 100 mg via INTRAVENOUS

## 2021-06-23 MED ORDER — LIDOCAINE HCL (CARDIAC) PF 100 MG/5ML IV SOSY
PREFILLED_SYRINGE | INTRAVENOUS | Status: DC | PRN
Start: 2021-06-23 — End: 2021-06-23
  Administered 2021-06-23: 100 mg via INTRAVENOUS

## 2021-06-23 MED ORDER — FENTANYL CITRATE (PF) 100 MCG/2ML IJ SOLN
INTRAMUSCULAR | Status: DC | PRN
  Administered 2021-06-23 (×3): 50 ug via INTRAVENOUS

## 2021-06-23 MED ORDER — ONDANSETRON HCL 4 MG/2ML IJ SOLN: 4.0000 mg | Freq: Once | INTRAMUSCULAR | Status: DC | PRN

## 2021-06-23 MED ORDER — FENTANYL CITRATE (PF) 100 MCG/2ML IJ SOLN: 25.0000 ug | INTRAMUSCULAR | Status: DC | PRN

## 2021-06-23 MED ORDER — ROCURONIUM BROMIDE 100 MG/10ML IV SOLN
INTRAVENOUS | Status: DC | PRN
  Administered 2021-06-23: 50 mg via INTRAVENOUS
  Administered 2021-06-23: 10 mg via INTRAVENOUS

## 2021-06-23 MED ORDER — DEXAMETHASONE SODIUM PHOSPHATE 10 MG/ML IJ SOLN
INTRAMUSCULAR | Status: DC | PRN
  Administered 2021-06-23: 10 mg via INTRAVENOUS

## 2021-06-23 MED ORDER — LABETALOL HCL 5 MG/ML IV SOLN: 5.0000 mg | INTRAVENOUS | Status: DC | PRN

## 2021-06-23 MED ORDER — PHENYLEPHRINE 40 MCG/ML (10ML) SYRINGE FOR IV PUSH (FOR BLOOD PRESSURE SUPPORT)
PREFILLED_SYRINGE | INTRAVENOUS | Status: DC | PRN
  Administered 2021-06-23: 40 ug via INTRAVENOUS

## 2021-06-23 MED ORDER — BUPIVACAINE LIPOSOME 1.3 % IJ SUSP
INTRAMUSCULAR | Status: DC | PRN
  Administered 2021-06-23: 20 mL

## 2021-06-23 MED ORDER — CEFAZOLIN SODIUM-DEXTROSE 2-4 GM/100ML-% IV SOLN
INTRAVENOUS | Status: AC
  Filled 2021-06-23: qty 100

## 2021-06-23 MED ORDER — BUPIVACAINE-EPINEPHRINE 0.5% -1:200000 IJ SOLN
INTRAMUSCULAR | Status: DC | PRN
  Administered 2021-06-23: 14 mL

## 2021-06-23 MED ORDER — BUPIVACAINE-EPINEPHRINE (PF) 0.5% -1:200000 IJ SOLN
INTRAMUSCULAR | Status: AC
  Filled 2021-06-23: qty 30

## 2021-06-23 MED ORDER — ACETAMINOPHEN 325 MG PO TABS: 650.0000 mg | ORAL_TABLET | Freq: Three times a day (TID) | ORAL | 0 refills | Status: AC | PRN

## 2021-06-23 MED ORDER — TRAMADOL HCL 50 MG PO TABS: 50.0000 mg | ORAL_TABLET | Freq: Four times a day (QID) | ORAL | 0 refills | Status: AC | PRN

## 2021-06-23 MED ORDER — CELECOXIB 200 MG PO CAPS
ORAL_CAPSULE | ORAL | Status: AC
  Administered 2021-06-23: 200 mg via ORAL
  Filled 2021-06-23: qty 1

## 2021-06-23 MED ORDER — FENTANYL CITRATE (PF) 100 MCG/2ML IJ SOLN
INTRAMUSCULAR | Status: AC
  Filled 2021-06-23: qty 2

## 2021-06-23 MED ORDER — CHLORHEXIDINE GLUCONATE 0.12 % MT SOLN
OROMUCOSAL | Status: AC
  Administered 2021-06-23: 15 mL via OROMUCOSAL
  Filled 2021-06-23: qty 15

## 2021-06-23 MED ORDER — PROPOFOL 10 MG/ML IV BOLUS
INTRAVENOUS | Status: DC | PRN
  Administered 2021-06-23: 120 mg via INTRAVENOUS
  Administered 2021-06-23: 40 mg via INTRAVENOUS

## 2021-06-23 MED ORDER — BUPIVACAINE LIPOSOME 1.3 % IJ SUSP
INTRAMUSCULAR | Status: AC
  Filled 2021-06-23: qty 20

## 2021-06-23 MED ORDER — ACETAMINOPHEN 500 MG PO TABS
ORAL_TABLET | ORAL | Status: AC
  Administered 2021-06-23: 1000 mg via ORAL
  Filled 2021-06-23: qty 2

## 2021-06-23 MED ORDER — LABETALOL HCL 5 MG/ML IV SOLN
INTRAVENOUS | Status: AC
  Administered 2021-06-23: 5 mg
  Filled 2021-06-23: qty 4

## 2021-06-23 MED ORDER — DOCUSATE SODIUM 100 MG PO CAPS: 100.0000 mg | ORAL_CAPSULE | Freq: Two times a day (BID) | ORAL | 0 refills | Status: AC | PRN

## 2021-06-23 MED ORDER — ONDANSETRON HCL 4 MG/2ML IJ SOLN
INTRAMUSCULAR | Status: DC | PRN
  Administered 2021-06-23: 4 mg via INTRAVENOUS

## 2021-06-23 SURGICAL SUPPLY — 48 items
BAG INFUSER PRESSURE 100CC (MISCELLANEOUS) IMPLANT
BLADE SURG SZ11 CARB STEEL (BLADE) ×3 IMPLANT
BNDG GAUZE ELAST 4 BULKY (GAUZE/BANDAGES/DRESSINGS) ×3 IMPLANT
COVER TIP SHEARS 8 DVNC (MISCELLANEOUS) ×2 IMPLANT
COVER TIP SHEARS 8MM DA VINCI (MISCELLANEOUS) ×1
DERMABOND ADVANCED (GAUZE/BANDAGES/DRESSINGS) ×1
DERMABOND ADVANCED .7 DNX12 (GAUZE/BANDAGES/DRESSINGS) ×2 IMPLANT
DRAPE ARM DVNC X/XI (DISPOSABLE) ×6 IMPLANT
DRAPE COLUMN DVNC XI (DISPOSABLE) ×2 IMPLANT
DRAPE DA VINCI XI ARM (DISPOSABLE) ×3
DRAPE DA VINCI XI COLUMN (DISPOSABLE) ×1
ELECT CAUTERY BLADE 6.4 (BLADE) IMPLANT
ELECT REM PT RETURN 9FT ADLT (ELECTROSURGICAL) ×3
ELECTRODE REM PT RTRN 9FT ADLT (ELECTROSURGICAL) ×2 IMPLANT
GLOVE SURG SYN 6.5 ES PF (GLOVE) ×12 IMPLANT
GLOVE SURG SYN 6.5 PF PI (GLOVE) ×4 IMPLANT
GLOVE SURG UNDER POLY LF SZ7 (GLOVE) ×8 IMPLANT
GOWN STRL REUS W/ TWL LRG LVL3 (GOWN DISPOSABLE) ×6 IMPLANT
GOWN STRL REUS W/TWL LRG LVL3 (GOWN DISPOSABLE) ×4
IRRIGATOR SUCT 8 DISP DVNC XI (IRRIGATION / IRRIGATOR) IMPLANT
IRRIGATOR SUCTION 8MM XI DISP (IRRIGATION / IRRIGATOR)
IV NS 1000ML (IV SOLUTION)
IV NS 1000ML BAXH (IV SOLUTION) IMPLANT
LABEL OR SOLS (LABEL) IMPLANT
MANIFOLD NEPTUNE II (INSTRUMENTS) ×3 IMPLANT
MESH 3DMAX 4X6 RT LRG (Mesh General) ×1 IMPLANT
MESH 3DMAX MID 4X6 RT LRG (Mesh General) IMPLANT
NDL INSUFFLATION 14GA 120MM (NEEDLE) ×2 IMPLANT
NEEDLE HYPO 22GX1.5 SAFETY (NEEDLE) ×3 IMPLANT
NEEDLE INSUFFLATION 14GA 120MM (NEEDLE) ×3 IMPLANT
OBTURATOR OPTICAL STANDARD 8MM (TROCAR) ×1
OBTURATOR OPTICAL STND 8 DVNC (TROCAR) ×2
OBTURATOR OPTICALSTD 8 DVNC (TROCAR) ×2 IMPLANT
PACK LAP CHOLECYSTECTOMY (MISCELLANEOUS) ×3 IMPLANT
PENCIL ELECTRO HAND CTR (MISCELLANEOUS) ×3 IMPLANT
SEAL CANN UNIV 5-8 DVNC XI (MISCELLANEOUS) ×6 IMPLANT
SEAL XI 5MM-8MM UNIVERSAL (MISCELLANEOUS) ×3
SET TUBE SMOKE EVAC HIGH FLOW (TUBING) ×3 IMPLANT
SOLUTION ELECTROLUBE (MISCELLANEOUS) ×3 IMPLANT
SUT MNCRL 4-0 (SUTURE) ×2
SUT MNCRL 4-0 27XMFL (SUTURE) ×4
SUT VIC AB 2-0 SH 27 (SUTURE) ×1
SUT VIC AB 2-0 SH 27XBRD (SUTURE) ×2 IMPLANT
SUT VLOC 90 6 CV-15 VIOLET (SUTURE) ×5 IMPLANT
SUTURE MNCRL 4-0 27XMF (SUTURE) ×2 IMPLANT
SYR 30ML LL (SYRINGE) ×3 IMPLANT
TAPE TRANSPORE STRL 2 31045 (GAUZE/BANDAGES/DRESSINGS) ×3 IMPLANT
WATER STERILE IRR 500ML POUR (IV SOLUTION) ×2 IMPLANT

## 2021-06-23 NOTE — Anesthesia Preprocedure Evaluation (Signed)
Anesthesia Evaluation  Patient identified by MRN, date of birth, ID band Patient awake    Reviewed: Allergy & Precautions, NPO status , Patient's Chart, lab work & pertinent test results  Airway Mallampati: III  TM Distance: >3 FB Neck ROM: full    Dental  (+) Teeth Intact   Pulmonary neg pulmonary ROS, former smoker,    Pulmonary exam normal  + decreased breath sounds      Cardiovascular Exercise Tolerance: Poor hypertension, Pt. on medications + CAD, + Past MI and + Cardiac Stents  negative cardio ROS Normal cardiovascular exam Rhythm:Regular Rate:Normal     Neuro/Psych Dementia negative neurological ROS  negative psych ROS   GI/Hepatic negative GI ROS, Neg liver ROS, GERD  ,  Endo/Other  negative endocrine ROS  Renal/GU Renal InsufficiencyRenal disease  negative genitourinary   Musculoskeletal  (+) Arthritis ,   Abdominal Normal abdominal exam  (+)   Peds negative pediatric ROS (+)  Hematology negative hematology ROS (+)   Anesthesia Other Findings Past Medical History: No date: Alzheimer's disease ([place]) No date: Anginal pain ([place]) 05/29/2017: Aortic dilatation (HCC)     Comment:  a.) TTE 05/29/2017: Aortic root measured 40 mm;               ascending aorta measured 38 mm. No date: Arthritis No date: BPH (benign prostatic hyperplasia) No date: CKD (chronic kidney disease), stage V ([place]) 05/28/2017: Coronary artery disease     Comment:  a.) lateral STEMI 05/28/2017 --> LHC: 60% pLAD, 70%               mLAD, 100% oRI-RI 17/40/8144: Diastolic dysfunction     Comment:  a.)  TTE: LVEF 45-50%; anterior and anterolateral HK;               G1DD. No date: GERD (gastroesophageal reflux disease) No date: Gout No date: HLD (hyperlipidemia) No date: Hypertension 05/29/2017: Ischemic cardiomyopathy     Comment:  a.) TTE 05/29/2017: EF 45-50%; anterior and               anterolateral HK; G1DD. No date:  Migraines 05/28/2017: ST elevation myocardial infarction (STEMI) of lateral  wall ([place])     Comment:  a.) LHC 05/28/2017: LVEDP normal; angio deferred due to               CKD-IV; 60% pLAD, 70% mLAD, 100% oRI-RI --> PCI placing a              2.50 x 15 mm Sierra DES to RI; avoid RIGHT radial               approach in the future due to severe tortuosity of               innominate artery.  Past Surgical History: No date: CARDIAC CATHETERIZATION No date: COLONOSCOPY 05/28/2017: CORONARY/GRAFT ACUTE MI REVASCULARIZATION; N/A     Comment:  Procedure: Coronary/Graft Acute MI Revascularization;                Surgeon: Wellington Hampshire, MD;  Location: [facility]               [facility];  Service: Cardiovascular;  Laterality: N/A; No date: EYE SURGERY 05/28/2017: LEFT HEART CATH AND CORONARY ANGIOGRAPHY; N/A     Comment:  Procedure: LEFT HEART CATH AND CORONARY ANGIOGRAPHY;                Surgeon: Wellington Hampshire, MD;  [facility]  INVASIVE               CV LAB;  Service: Cardiovascular;  Laterality: N/A;  BMI    Body Mass Index: 24.35 kg/m      Reproductive/Obstetrics negative OB ROS                             Anesthesia Physical Anesthesia Plan  ASA: 3  Anesthesia Plan: General   Post-op Pain Management:    Induction: Intravenous  PONV Risk Score and Plan: Ondansetron, Dexamethasone, Midazolam and Treatment may vary due to age or medical condition  Airway Management Planned: Oral ETT  Additional Equipment:   Intra-op Plan:   Post-operative Plan: Extubation in OR  Informed Consent: I have reviewed the patients History and Physical, chart, labs and discussed the procedure including the risks, benefits and alternatives for the proposed anesthesia with the patient or authorized representative who has indicated his/her understanding and acceptance.     Dental Advisory Given  Plan Discussed with: CRNA and Surgeon  Anesthesia Plan Comments:          Anesthesia Quick Evaluation

## 2021-06-23 NOTE — Op Note (Signed)
Preoperative diagnosis: right, reducible inguinal Hernia.  Postoperative diagnosis: right direct inguinal Hernia  Procedure: Robotic assisted laparoscopic right, reducible inguinal hernia repair with mesh  Anesthesia: General  Surgeon: Dr. Lysle Pearl  Wound Classification: Clean  Specimen: none  Complications: None  Estimated Blood Loss: 70mL   Indications:  inguinal hernia. Repair was indicated to avoid complications of incarceration, obstruction and pain, and a prosthetic mesh repair was elected.  See H&P for further details.  Findings: 1. Vas Deferens and cord structures identified and preserved 2. Bard 3D max medium weight mesh used for repair 3. Adequate hemostasis achieved  Description of procedure: The patient was taken to the operating room. A time-out was completed verifying correct patient, procedure, site, positioning, and implant(s) and/or special equipment prior to beginning this procedure.  Area was prepped and draped in the usual sterile fashion. An incision was marked 20 cm above the pubic tubercle, slightly above the umbilicus  Scrotum wrapped in Kerlix roll.  Veress needle inserted at palmer's point.  Saline drop test noted to be positive with gradual increase in pressure after initiation of gas insufflation.  15 mm of pressure was achieved prior to removing the Veress needle and then placing a 8 mm port via the Optiview technique through the supraumbilical site.  Inspection of the area afterwards noted no injury to the surrounding organs during insertion of the needle and the port.  2 port sites were marked 8 cm to the lateral sides of the initial port, and a 8 mm robotic port was placed on the left side, another 8 mm robotic port on the right side under direct supervision.  Local anesthesia  infused to the preplanned incision sites prior to insertion of the port.  The [place] was then brought into the operative field and docked to the ports.  Examination of  the abdominal cavity noted a right inguinal hernia.  A peritoneal flap was created approximately 8cm cephalad to the defect by using scissors with electrocautery.  Dissection was carried down towards the pubic tubercle, developing the myopectineal orifice view.  Laterally the flap was carried towards the ASIS.  Small hernia sac was noted, which carefully dissected away from the adjacent tissues to be fully reduced out of hernia cavity.  Any bleeding was controlled with combination of electrocautery and manual pressure.    After confirming adequate dissection and the peritoneal reflection completely down and away from the cord structures, a Large Bard 3DMax medium weight mesh was placed within the anterior abdominal wall, secured in place using 2-0 Vicryl on an SH needle immediately above the pubic tubercle.  After noting proper placement of the mesh with the peritoneal reflection deep to it, the previously created peritoneal flap was secured back up to the anterior abdominal wall using running 3-0 V-Lock.  Both needles were then removed out of the abdominal cavity, Xi platform undocked from the ports and removed off of operative field.  exparel infused as ilioinguinal block.  Abdomen then desufflated and ports removed. All the skin incisions were then closed with a subcuticular stitch of Monocryl 4-0. Dermabond was applied. The testis was gently pulled down into its anatomic position in the scrotum.  The patient tolerated the procedure well and was taken to the postanesthesia care unit in stable condition. Sponge and instrument count correct at end of procedure.

## 2021-06-23 NOTE — Interval H&P Note (Signed)
No changes in symptoms. Discuss the high risk categorization for his upcoming surgery per cardiology note. Patient and family state they cannot continue to deal with the pain that he is experiencing with this current hernia, and still wishes to proceed understanding the higher risk of complications.

## 2021-06-23 NOTE — Discharge Instructions (Addendum)
Hernia repair, Care After This sheet gives you information about how to care for yourself after your procedure. Your health care provider may also give you more specific instructions. If you have problems or questions, contact your health care provider. What can I expect after the procedure? After your procedure, it is common to have the following: Pain in your abdomen, especially in the incision areas. You will be given medicine to control the pain. Tiredness. This is a normal part of the recovery process. Your energy level will return to normal over the next several weeks. Changes in your bowel movements, such as constipation or needing to go more often. Talk with your health care provider about how to manage this. Follow these instructions at home: Medicines  tylenol as needed for discomfort.    Use narcotics, if prescribed, only when tylenol and motrin is not enough to control pain.  325-650mg  every 8hrs to max of 3000mg /24hrs (including the 325mg  in every norco dose) for the tylenol.    PLEASE RECORD NUMBER OF PILLS TAKEN UNTIL NEXT FOLLOW UP APPT.  THIS WILL HELP DETERMINE HOW READY YOU ARE TO BE RELEASED FROM ANY ACTIVITY RESTRICTIONS Do not drive or use heavy machinery while taking prescription pain medicine. Do not drink alcohol while taking prescription pain medicine.  Incision care    Follow instructions from your health care provider about how to take care of your incision areas. Make sure you: Keep your incisions clean and dry. Wash your hands with soap and water before and after applying medicine to the areas, and before and after changing your bandage (dressing). If soap and water are not available, use hand sanitizer. Change your dressing as told by your health care provider. Leave stitches (sutures), skin glue, or adhesive strips in place. These skin closures may need to stay in place for 2 weeks or longer. If adhesive strip edges start to loosen and curl up, you may trim the  loose edges. Do not remove adhesive strips completely unless your health care provider tells you to do that. Do not wear tight clothing over the incisions. Tight clothing may rub and irritate the incision areas, which may cause the incisions to open. Do not take baths, swim, or use a hot tub until your health care provider approves. OK TO SHOWER IN 24HRS.   Check your incision area every day for signs of infection. Check for: More redness, swelling, or pain. More fluid or blood. Warmth. Pus or a bad smell. Activity Avoid lifting anything that is heavier than 10 lb (4.5 kg) for 2 weeks or until your health care provider says it is okay. No pushing/pulling greater than 30lbs You may resume normal activities as told by your health care provider. Ask your health care provider what activities are safe for you. Take rest breaks during the day as needed. Eating and drinking Follow instructions from your health care provider about what you can eat after surgery. To prevent or treat constipation while you are taking prescription pain medicine, your health care provider may recommend that you: Drink enough fluid to keep your urine clear or pale yellow. Take over-the-counter or prescription medicines. Eat foods that are high in fiber, such as fresh fruits and vegetables, whole grains, and beans. Limit foods that are high in fat and processed sugars, such as fried and sweet foods. General instructions Ask your health care provider when you will need an appointment to get your sutures or staples removed. Keep all follow-up visits as told by  your health care provider. This is important. Contact a health care provider if: You have more redness, swelling, or pain around your incisions. You have more fluid or blood coming from the incisions. Your incisions feel warm to the touch. You have pus or a bad smell coming from your incisions or your dressing. You have a fever. You have an incision that breaks  open (edges not staying together) after sutures or staples have been removed. You develop a rash. You have chest pain or difficulty breathing. You have pain or swelling in your legs. You feel light-headed or you faint. Your abdomen swells (becomes distended). You have nausea or vomiting. You have blood in your stool (feces). This information is not intended to replace advice given to you by your health care provider. Make sure you discuss any questions you have with your health care provider. Document Released: 11/11/2004 Document Revised: 01/11/2018 Document Reviewed: 01/24/2016 Elsevier Interactive Patient Education  2019 [place]   The drugs that you were given will stay in your system until tomorrow so for the next 24 hours you should not:  Drive an automobile Make any legal decisions Drink any alcoholic beverage   You may resume regular meals tomorrow.  Today it is better to start with liquids and gradually work up to solid foods.  You may eat anything you prefer, but it is better to start with liquids, then soup and crackers, and gradually work up to solid foods.   Please notify your doctor immediately if you have any unusual bleeding, trouble breathing, redness and pain at the surgery site, drainage, fever, or pain not relieved by medication.    Additional Instructions:        Please contact your physician with any problems or Same Day Surgery at [phone], Monday through Friday 6 am to 4 pm, or [place] at [facility] number at [phone].

## 2021-06-23 NOTE — Transfer of Care (Signed)
Immediate Anesthesia Transfer of Care Note  Patient: Troy Chavez  Procedure(s) Performed: XI ROBOTIC ASSISTED INGUINAL HERNIA (Right: Groin) INSERTION OF MESH  Patient Location: PACU  Anesthesia Type:General  Level of Consciousness: awake  Airway & Oxygen Therapy: Patient Spontanous Breathing and Patient connected to face mask oxygen  Post-op Assessment: Report given to RN and Post -op Vital signs reviewed and stable  Post vital signs: Reviewed and stable  Last Vitals:  Vitals Value Taken Time  BP 186/91 06/23/21 1042  Temp 36.3 C 06/23/21 1039  Pulse 73 06/23/21 1042  Resp 22 06/23/21 1042  SpO2 99 % 06/23/21 1042  Vitals shown include unvalidated device data.  Last Pain:  Vitals:   06/23/21 1039  TempSrc:   PainSc: Asleep         Complications: No notable events documented.

## 2021-06-23 NOTE — Anesthesia Postprocedure Evaluation (Signed)
Anesthesia Post Note  Patient: Troy Chavez  Procedure(s) Performed: XI ROBOTIC ASSISTED INGUINAL HERNIA (Right: Groin) INSERTION OF MESH  Patient location during evaluation: PACU Anesthesia Type: General Level of consciousness: awake and awake and alert Pain management: satisfactory to patient Vital Signs Assessment: post-procedure vital signs reviewed and stable Respiratory status: spontaneous breathing and respiratory function stable Anesthetic complications: no   No notable events documented.   Last Vitals:  Vitals:   06/23/21 1045 06/23/21 1100  BP: (!) 180/102 140/86  Pulse: 71 (!) 58  Resp: 16 11  Temp:    SpO2: 98% 90%    Last Pain:  Vitals:   06/23/21 1039  TempSrc:   PainSc: Asleep                 VAN STAVEREN,[place]

## 2021-06-23 NOTE — Anesthesia Procedure Notes (Signed)
Procedure Name: Intubation Date/Time: 06/23/2021 9:20 AM Performed by: Loletha Grayer, CRNA Pre-anesthesia Checklist: Patient identified, Patient being monitored, Timeout performed, Emergency Drugs available and Suction available Patient Re-evaluated:Patient Re-evaluated prior to induction Oxygen Delivery Method: Circle system utilized Preoxygenation: Pre-oxygenation with 100% oxygen Induction Type: IV induction Ventilation: Mask ventilation without difficulty Laryngoscope Size: Mac and 3 Grade View: Grade I Tube type: Oral Tube size: 7.5 mm Number of attempts: 1 Airway Equipment and Method: Stylet Placement Confirmation: ETT inserted through vocal cords under direct vision, positive ETCO2 and breath sounds checked- equal and bilateral Secured at: 22 cm Tube secured with: Tape Dental Injury: Teeth and Oropharynx as per pre-operative assessment

## 2021-06-24 ENCOUNTER — Encounter: Payer: Self-pay | Admitting: Surgery

## 2021-07-26 ENCOUNTER — Emergency Department: Payer: Medicare Other

## 2021-07-26 ENCOUNTER — Encounter: Payer: Self-pay | Admitting: *Deleted

## 2021-07-26 ENCOUNTER — Emergency Department
Admission: EM | Admit: 2021-07-26 | Discharge: 2021-07-26 | Disposition: A | Discharge status: Home / Self Care | Payer: Medicare Other | Attending: Emergency Medicine | Admitting: Emergency Medicine

## 2021-07-26 ENCOUNTER — Other Ambulatory Visit: Payer: Self-pay

## 2021-07-26 DIAGNOSIS — N185 Chronic kidney disease, stage 5: Secondary | ICD-10-CM | POA: Insufficient documentation

## 2021-07-26 DIAGNOSIS — G309 Alzheimer's disease, unspecified: Secondary | ICD-10-CM | POA: Insufficient documentation

## 2021-07-26 DIAGNOSIS — F0280 Dementia in other diseases classified elsewhere without behavioral disturbance: Secondary | ICD-10-CM | POA: Insufficient documentation

## 2021-07-26 DIAGNOSIS — E875 Hyperkalemia: Secondary | ICD-10-CM | POA: Diagnosis not present

## 2021-07-26 DIAGNOSIS — I2510 Atherosclerotic heart disease of native coronary artery without angina pectoris: Secondary | ICD-10-CM | POA: Diagnosis not present

## 2021-07-26 DIAGNOSIS — S299XXA Unspecified injury of thorax, initial encounter: Secondary | ICD-10-CM | POA: Insufficient documentation

## 2021-07-26 DIAGNOSIS — I120 Hypertensive chronic kidney disease with stage 5 chronic kidney disease or end stage renal disease: Secondary | ICD-10-CM | POA: Diagnosis not present

## 2021-07-26 DIAGNOSIS — Y92410 Unspecified street and highway as the place of occurrence of the external cause: Secondary | ICD-10-CM | POA: Insufficient documentation

## 2021-07-26 DIAGNOSIS — N189 Chronic kidney disease, unspecified: Secondary | ICD-10-CM

## 2021-07-26 LAB — BASIC METABOLIC PANEL
Anion gap: 10 (ref 5–15)
BUN: 53 mg/dL — ABNORMAL HIGH (ref 8–23)
CO2: 18 mmol/L — ABNORMAL LOW (ref 22–32)
Calcium: 8.9 mg/dL (ref 8.9–10.3)
Chloride: 105 mmol/L (ref 98–111)
Creatinine, Ser: 6.81 mg/dL — ABNORMAL HIGH (ref 0.61–1.24)
GFR, Estimated: 8 mL/min — ABNORMAL LOW (ref >60)
Glucose, Bld: 110 mg/dL — ABNORMAL HIGH (ref 70–99)
Potassium: 5.4 mmol/L — ABNORMAL HIGH (ref 3.5–5.1)
Sodium: 133 mmol/L — ABNORMAL LOW (ref 135–145)

## 2021-07-26 LAB — CBC
HCT: 28.1 % — ABNORMAL LOW (ref 39.0–52.0)
Hemoglobin: 9.2 g/dL — ABNORMAL LOW (ref 13.0–17.0)
MCH: 30.2 pg (ref 26.0–34.0)
MCHC: 32.7 g/dL (ref 30.0–36.0)
MCV: 92.1 fL (ref 80.0–100.0)
Platelets: 235 10*3/uL (ref 150–400)
RBC: 3.05 MIL/uL — ABNORMAL LOW (ref 4.22–5.81)
RDW: 13.1 % (ref 11.5–15.5)
WBC: 7.5 10*3/uL (ref 4.0–10.5)
nRBC: 0 % (ref 0.0–0.2)

## 2021-07-26 LAB — TROPONIN I (HIGH SENSITIVITY): Troponin I (High Sensitivity): 8 ng/L (ref <18)

## 2021-07-26 MED ORDER — ACETAMINOPHEN 500 MG PO TABS
1000.0000 mg | ORAL_TABLET | Freq: Once | ORAL | Status: AC
  Administered 2021-07-26: 1000 mg via ORAL

## 2021-07-26 MED ORDER — ACETAMINOPHEN 500 MG PO TABS
ORAL_TABLET | ORAL | Status: AC
  Filled 2021-07-26: qty 2

## 2021-07-26 MED ORDER — SODIUM ZIRCONIUM CYCLOSILICATE 10 G PO PACK
10.0000 g | PACK | Freq: Once | ORAL | Status: AC
  Administered 2021-07-26: 10 g via ORAL
  Filled 2021-07-26: qty 1
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
## 2021-07-26 NOTE — Discharge Instructions (Addendum)
Your kidney function has worsened.  Please follow-up with a nephrologist.  You should hear from them to schedule an appointment within the next week or so but if you do not please call to schedule an appointment.  Please avoid taking any NSAIDs including ibuprofen or Motrin as this can make your kidneys worse. ?
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
## 2021-07-29 DIAGNOSIS — Z66 Do not resuscitate: Secondary | ICD-10-CM | POA: Insufficient documentation

## 2021-10-05 ENCOUNTER — Telehealth: Payer: Self-pay | Admitting: Student

## 2021-10-05 NOTE — Telephone Encounter (Signed)
Spoke with patient's wife regarding the Palliative referral/services and all questions were answered and verbal consent was rec'd to begin Palliative services.   I have scheduled an In-home Consult for 10/13/21 @ 11:30 AM

## 2021-10-13 ENCOUNTER — Other Ambulatory Visit: Payer: Medicare Other | Admitting: Student

## 2021-10-13 DIAGNOSIS — Z515 Encounter for palliative care: Secondary | ICD-10-CM

## 2021-10-13 DIAGNOSIS — F0280 Dementia in other diseases classified elsewhere without behavioral disturbance: Secondary | ICD-10-CM

## 2021-10-13 DIAGNOSIS — N185 Chronic kidney disease, stage 5: Secondary | ICD-10-CM

## 2021-10-13 NOTE — Progress Notes (Signed)
  [facility] Consult Note Telephone: [phone]  Fax: [phone]   Date of encounter: 10/13/21 11:48 AM PATIENT NAME: Troy Chavez [address] [place] [address]   [phone] (home)  DOB: 04/09/1946 MRN: [id number] PRIMARY CARE PROVIDER:    Miller, Mark F, MD,  [address] [facility] [place] [address] [phone]  REFERRING PROVIDER:   Miller, Mark F, MD [address] [facility] [place],  [place] [address] [phone]  RESPONSIBLE PARTY:    Contact Information     Name Relation Home Work Mobile   Bissette,Becky B Spouse [phone]  [phone]        I met face to face with patient and family in the home. Palliative Care was asked to follow this patient by consultation request of  Miller, Mark F, MD to address advance care planning and complex medical decision making. This is the initial visit.                                     ASSESSMENT AND PLAN / RECOMMENDATIONS:   Advance Care Planning/Goals of Care: Goals include to maximize quality of life and symptom management. Patient/health care surrogate gave his/her permission to discuss.Our advance care planning conversation included a discussion about:    The value and importance of advance care planning  Experiences with loved ones who have been seriously ill or have died  Exploration of personal, cultural or spiritual beliefs that might influence medical decisions  Exploration of goals of care in the event of a sudden injury or illness  CODE STATUS: DNR  Education provided on Palliative Medicine vs. Hospice services. Education provided on CKD 5, dialysis and what would happen if he does not receive dialysis. Wife expresses wanting decision to be patients and wanted daughter present to witness his decision. Patient does have dementia. He is agreeable to see nephrology. Plan is to regroup after  meeting with nephrologist. Discussed aggressive treatment plan vs. comfort care. Patient would be eligible for hospice should he not receive dialysis.    Symptom Management/Plan:  CKD 5- GFR 10, creatinine 5.7 09/21/21. Patient expresses interest in follow up with nephrology to discuss dialysis. Patient will see Nephrology next week. Education provided on nephrotoxic medications. Patient does express increased fatigue, itching. Continue hydroxyzine BID, amlodipine as directed. Will provide symptom management as needed.   Alzheimer's Dementia-patient requires cueing/redirection. He is able to complete adl's. Family to assist with adl's as needed, redirect as needed. Monitor for functional and cognitive decline. Monitor for falls/safety.    Follow up Palliative Care Visit: Palliative care will continue to follow for complex medical decision making, advance care planning, and clarification of goals. Return in 4 weeks or prn.  I spent 60 minutes providing this consultation. More than 50% of the time in this consultation was spent in counseling and care coordination.   PPS: 70%  HOSPICE ELIGIBILITY/DIAGNOSIS: TBD  Chief Complaint: Palliative Medicine initial consult.  HISTORY OF PRESENT ILLNESS:  Troy Chavez is a 76 y.o. year old male  with CKD 5, dementia without behavioral disturbance, hypertension, hyperlipidemia, CAD without angina, GERD.  Patient resides at home with wife. He is able to complete adl's. No recent falls or injury.  Patient does endorses increased fatigue, sleeping more. He also reports itching. He denies pain, shortness of breath, nausea, constipation. He still makes urine. He   endorses a good appetite. He is a retired plant manager. He has 4 children.   Patient received sitting on couch. He has pleasant affect. He does have some forgetfulness noted. Patient expresses wanting to meet with nephrology to discuss dialysis. A 10-point ROS is negative, except for the pertinent  positives and negatives detailed per the HPI.  History obtained from review of EMR, discussion with primary team, and interview with family, facility staff/caregiver and/or Mr. Shimizu.  I reviewed available labs, medications, imaging, studies and related documents from the EMR.  Records reviewed and summarized above.    Physical Exam: Pulse 60. Resp 16, bp 144/88, sats 94% on room air Constitutional: NAD General: frail appearing, thin EYES: anicteric sclera, lids intact, no discharge  ENMT: intact hearing, oral mucous membranes moist, dentition intact CV: S1S2, RRR, no LE edema Pulmonary: LCTA, no increased work of breathing, no cough, room air Abdomen: normo-active BS + 4 quadrants, soft and non tender, no ascites GU: deferred MSK:  moves all extremities, ambulatory Skin: warm and dry, no rashes or wounds on visible skin Neuro:  no generalized weakness, A & O x 2, forgetful Psych: non-anxious affect, pleasant Hem/lymph/immuno: no widespread bruising CURRENT PROBLEM LIST:  Patient Active Problem List   Diagnosis Date Noted   Coronary artery disease involving native coronary artery of native heart without angina pectoris 06/09/2019   Acute ST elevation myocardial infarction (STEMI) of lateral wall (HCC) 05/28/2017   STEMI (ST elevation myocardial infarction) (HCC) 05/28/2017   Chest pain 05/06/2017   CKD (chronic kidney disease), stage IV (HCC) 05/06/2017   Essential hypertension 05/06/2017   Alzheimer disease (HCC) 05/06/2017   GERD (gastroesophageal reflux disease) 05/06/2017   Benign essential hypertension 05/26/2016   Hyperlipidemia LDL goal <70 07/06/2014   PAST MEDICAL HISTORY:  Active Ambulatory Problems    Diagnosis Date Noted   Chest pain 05/06/2017   CKD (chronic kidney disease), stage IV (HCC) 05/06/2017   Essential hypertension 05/06/2017   Alzheimer disease (HCC) 05/06/2017   GERD (gastroesophageal reflux disease) 05/06/2017   Acute ST elevation myocardial  infarction (STEMI) of lateral wall (HCC) 05/28/2017   STEMI (ST elevation myocardial infarction) (HCC) 05/28/2017   Benign essential hypertension 05/26/2016   Hyperlipidemia LDL goal <70 07/06/2014   Coronary artery disease involving native coronary artery of native heart without angina pectoris 06/09/2019   Resolved Ambulatory Problems    Diagnosis Date Noted   No Resolved Ambulatory Problems   Past Medical History:  Diagnosis Date   Alzheimer's disease (HCC)    Anginal pain (HCC)    Aortic dilatation (HCC) 05/29/2017   Arthritis    BPH (benign prostatic hyperplasia)    CKD (chronic kidney disease), stage V (HCC)    Coronary artery disease 05/28/2017   Diastolic dysfunction 05/29/2017   Gout    HLD (hyperlipidemia)    Hypertension    Ischemic cardiomyopathy 05/29/2017   Migraines    ST elevation myocardial infarction (STEMI) of lateral wall (HCC) 05/28/2017   SOCIAL HX:  Social History   Tobacco Use   Smoking status: Former   Smokeless tobacco: Never   Tobacco comments:    Quit 35 yeaers ago  Substance Use Topics   Alcohol use: Not Currently    Comment: rare drinking for special occasions   FAMILY HX:  Family History  Problem Relation Age of Onset   Dementia Mother    Rheum arthritis Father       ALLERGIES:  Allergies  Allergen Reactions   Codeine       headache     PERTINENT MEDICATIONS:  Outpatient Encounter Medications as of 10/13/2021  Medication Sig   amLODipine (NORVASC) 10 MG tablet Take 1 tablet (10 mg total) by mouth daily.   atorvastatin (LIPITOR) 20 MG tablet Take 20 mg by mouth daily.   colchicine 0.6 MG tablet Take 0.6 mg by mouth daily as needed (Gout).   hydrALAZINE (APRESOLINE) 25 MG tablet Take 1 tablet (25 mg total) by mouth in the morning and at bedtime.   meclizine (ANTIVERT) 25 MG tablet Take 25 mg by mouth 3 (three) times daily as needed for dizziness.   pantoprazole (PROTONIX) 40 MG tablet Take 1 tablet (40 mg total) by mouth daily.    SUMAtriptan (IMITREX) 50 MG tablet Take 50 mg by mouth every 2 (two) hours as needed for migraine.    traMADol (ULTRAM) 50 MG tablet Take 1 tablet (50 mg total) by mouth every 6 (six) hours as needed.   No facility-administered encounter medications on file as of 10/13/2021.   Thank you for the opportunity to participate in the care of Mr. Wann.  The palliative care team will continue to follow. Please call our office at [phone] if we can be of additional assistance.   Ezekiel Slocumb, NP   COVID-19 PATIENT SCREENING TOOL Asked and negative response unless otherwise noted:  Have you had symptoms of covid, tested positive or been in contact with someone with symptoms/positive test in the past 5-10 days? No

## 2021-10-25 ENCOUNTER — Ambulatory Visit: Payer: Self-pay | Admitting: Surgery

## 2021-10-25 ENCOUNTER — Encounter: Payer: Self-pay | Admitting: Surgery

## 2021-10-25 ENCOUNTER — Other Ambulatory Visit: Payer: Self-pay

## 2021-10-25 ENCOUNTER — Ambulatory Visit: Payer: Medicare Other | Admitting: Surgery

## 2021-10-25 ENCOUNTER — Telehealth: Payer: Self-pay | Admitting: Surgery

## 2021-10-25 VITALS — BP 163/105 | HR 79 | Temp 98.0°F | Ht 69.0 in | Wt 158.4 lb

## 2021-10-25 DIAGNOSIS — Z992 Dependence on renal dialysis: Secondary | ICD-10-CM | POA: Diagnosis not present

## 2021-10-25 DIAGNOSIS — N184 Chronic kidney disease, stage 4 (severe): Secondary | ICD-10-CM

## 2021-10-25 DIAGNOSIS — I129 Hypertensive chronic kidney disease with stage 1 through stage 4 chronic kidney disease, or unspecified chronic kidney disease: Secondary | ICD-10-CM

## 2021-10-25 DIAGNOSIS — R399 Unspecified symptoms and signs involving the genitourinary system: Secondary | ICD-10-CM | POA: Insufficient documentation

## 2021-10-25 DIAGNOSIS — M109 Gout, unspecified: Secondary | ICD-10-CM | POA: Insufficient documentation

## 2021-10-25 DIAGNOSIS — G4700 Insomnia, unspecified: Secondary | ICD-10-CM | POA: Insufficient documentation

## 2021-10-25 DIAGNOSIS — G43909 Migraine, unspecified, not intractable, without status migrainosus: Secondary | ICD-10-CM | POA: Insufficient documentation

## 2021-10-25 NOTE — Telephone Encounter (Signed)
Patient calls back, they are informed of the following regarding surgery    Pre-Admission date/time, COVID Testing date and Surgery date.  Surgery Date: 10/31/21 Preadmission Testing Date: 10/27/21 (phone 8a-1p) Covid Testing Date: Not needed.    Also patient will need to call at [phone], between 1-3:00pm the day before surgery, to find out what time to arrive for surgery.

## 2021-10-25 NOTE — Patient Instructions (Signed)
Our surgery scheduler will call you within 24-48 hours to schedule your surgery. Please have the blue surgery sheet available when speaking with her.

## 2021-10-25 NOTE — Progress Notes (Signed)
Patient ID: Troy Chavez, male   DOB: September 04, 1945, 76 y.o.   MRN: [id number]  Chief Complaint: Consultation for CAPD catheter placement  History of Present Illness Troy Chavez is a 76 y.o. 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  Options of utilizing peritoneal dialysis discussed, and placement of a CAPD catheter discussed with the patient.  His most recent surgery, his only abdominal surgery is a robotic right inguinal hernia repair completed last February.  Past Medical History Past Medical History:  Diagnosis Date   Alzheimer's disease ([place])    Anginal pain ([place])    Aortic dilatation ([place]) 05/29/2017   a.) TTE 05/29/2017: Aortic root measured 40 mm; ascending aorta measured 38 mm.   Arthritis    BPH (benign prostatic hyperplasia)    CKD (chronic kidney disease), stage V ([facility])    Coronary artery disease 05/28/2017   a.) lateral STEMI 05/28/2017 --> LHC: 60% pLAD, 70% mLAD, 643% oRI-RI   Diastolic dysfunction 32/95/1884   a.)  TTE: LVEF 45-50%; anterior and anterolateral HK; G1DD.   GERD (gastroesophageal reflux disease)    Gout    HLD (hyperlipidemia)    Hypertension    Ischemic cardiomyopathy 05/29/2017   a.) TTE 05/29/2017: EF 45-50%; anterior and anterolateral HK; G1DD.   Migraines    ST elevation myocardial infarction (STEMI) of lateral wall ([place]) 05/28/2017   a.) LHC 05/28/2017: LVEDP normal; angio deferred due to CKD-IV; 60% pLAD, 70% mLAD, 100% oRI-RI --> PCI placing a 2.50 x 15 mm Sierra DES to RI; avoid RIGHT radial approach in the future due to severe tortuosity of innominate artery.      Past Surgical History:  Procedure Laterality Date   CARDIAC CATHETERIZATION     COLONOSCOPY     CORONARY/GRAFT ACUTE MI REVASCULARIZATION N/A 05/28/2017   Procedure: Coronary/Graft Acute MI Revascularization;  Surgeon: Wellington Hampshire, MD;  Location: [facility];  Service:  Cardiovascular;  Laterality: N/A;   EYE SURGERY     INSERTION OF MESH  06/23/2021   Procedure: INSERTION OF MESH;  Surgeon: Benjamine Sprague, DO;  Location: [facility];  Service: General;;   LEFT HEART CATH AND CORONARY ANGIOGRAPHY N/A 05/28/2017   Procedure: LEFT HEART CATH AND CORONARY ANGIOGRAPHY;  Surgeon: Wellington Hampshire, MD;  Location: [facility];  Service: Cardiovascular;  Laterality: N/A;    Allergies  Allergen Reactions   Codeine     headache    Current Outpatient Medications  Medication Sig Dispense Refill   amLODipine (NORVASC) 10 MG tablet Take 1 tablet (10 mg total) by mouth daily. 30 tablet 11   atorvastatin (LIPITOR) 20 MG tablet Take 20 mg by mouth daily.     colchicine 0.6 MG tablet Take 0.6 mg by mouth daily as needed (Gout).     meclizine (ANTIVERT) 25 MG tablet Take 25 mg by mouth 3 (three) times daily as needed for dizziness.     pantoprazole (PROTONIX) 40 MG tablet Take 1 tablet (40 mg total) by mouth daily. 60 tablet 0   SUMAtriptan (IMITREX) 50 MG tablet Take 50 mg by mouth every 2 (two) hours as needed for migraine.      traMADol (ULTRAM) 50 MG tablet Take 1 tablet (50 mg total) by mouth every 6 (six) hours as needed. 6 tablet 0   hydrALAZINE (APRESOLINE) 25 MG tablet Take 1 tablet (25 mg total) by mouth in the morning and at bedtime. [place]  tablet 11   No current facility-administered medications for this visit.    Family History Family History  Problem Relation Age of Onset   Dementia Mother    Rheum arthritis Father       Social History Social History   Tobacco Use   Smoking status: Former   Smokeless tobacco: Never   Tobacco comments:    Quit 35 yeaers ago  Scientific laboratory technician Use: Never used  Substance Use Topics   Alcohol use: Not Currently    Comment: rare drinking for special occasions   Drug use: No        Review of Systems  Constitutional: Negative.   HENT:  Positive for tinnitus.   Eyes: Negative.   Respiratory: Negative.     Cardiovascular: Negative.   Gastrointestinal:  Positive for heartburn.  Genitourinary: Negative.   Skin:  Positive for itching.  Neurological:  Positive for dizziness and headaches.  Psychiatric/Behavioral:  Positive for memory loss.       Physical Exam Blood pressure (!) 163/105, pulse 79, temperature 98 F (36.7 C), temperature source Oral, height '5\' 9"'$  (1.753 m), weight 158 lb 6.4 oz (71.8 kg), SpO2 99 %. Last Weight  Most recent update: 10/25/2021 10:53 AM    Weight  71.8 kg (158 lb 6.4 oz)             CONSTITUTIONAL: Well developed, and nourished, appropriately responsive and aware without distress.   EYES: Sclera non-icteric.   EARS, NOSE, MOUTH AND THROAT:  The oropharynx is clear. Oral mucosa is pink and moist.    Hearing is intact to voice.  NECK: Trachea is midline, and there is no jugular venous distension.  LYMPH NODES:  Lymph nodes in the neck are not enlarged. RESPIRATORY:  Lungs are clear, and breath sounds are equal bilaterally. Normal respiratory effort without pathologic use of accessory muscles. CARDIOVASCULAR: Heart is regular in rate and rhythm. GI: The abdomen is soft, nontender, and nondistended. There were no palpable masses. I did not appreciate hepatosplenomegaly. There were normal bowel sounds. MUSCULOSKELETAL:  Symmetrical muscle tone appreciated in all four extremities.    SKIN: Skin turgor is normal. No pathologic skin lesions appreciated.  NEUROLOGIC:  Motor and sensation appear grossly normal.  Cranial nerves are grossly without defect. PSYCH:  Alert and oriented to person, place and time. Affect is appropriate for situation.  Data Reviewed I have personally reviewed what is currently available of the patient's imaging, recent labs and medical records.   Labs:     Latest Ref Rng & Units 07/26/2021    5:08 PM 04/01/2021    2:31 PM 03/05/2019   10:18 AM  CBC  WBC 4.0 - 10.5 K/uL 7.5  5.7  6.0   Hemoglobin 13.0 - 17.0 g/dL 9.2  10.9  13.0    Hematocrit 39.0 - 52.0 % 28.1  33.3  39.7   Platelets 150 - 400 K/uL 235  218  203       Latest Ref Rng & Units 07/26/2021    5:08 PM 04/01/2021    2:31 PM 02/04/2019    2:31 PM  CMP  Glucose 70 - 99 mg/dL 110  94  102   BUN 8 - 23 mg/dL 53  59  31   Creatinine 0.61 - 1.24 mg/dL 6.81  5.08  2.77   Sodium 135 - 145 mmol/L 133  137  143   Potassium 3.5 - 5.1 mmol/L 5.4  4.7  4.2   Chloride  98 - 111 mmol/L 105  109  107   CO2 22 - 32 mmol/L '18  21  27   '$ Calcium 8.9 - 10.3 mg/dL 8.9  8.6  9.0   Total Protein 6.5 - 8.1 g/dL  7.0  7.2   Total Bilirubin 0.3 - 1.2 mg/dL  0.7  0.5   Alkaline Phos 38 - 126 U/L  71  73   AST 15 - 41 U/L  14  19   ALT 0 - 44 U/L  11  14       Imaging:  Within last 24 hrs: No results found.  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[place]) 07/29/2018   History of anterolateral myocardial infarction 06/05/2017   Acute ST elevation myocardial infarction (STEMI) of lateral wall ([place]) 05/28/2017   STEMI (ST elevation myocardial infarction) ([place]) 05/28/2017   Chest pain 05/06/2017   CKD (chronic kidney disease), stage IV ([place]) 05/06/2017   Essential hypertension 05/06/2017   Alzheimer disease ([place]) 05/06/2017   GERD (gastroesophageal reflux disease) 05/06/2017   Medicare annual wellness visit, initial 07/17/2016   Benign essential hypertension 05/26/2016   Hyperlipidemia LDL goal <70 07/06/2014   OA (osteoarthritis) 10/01/2013   Proteinuria 10/01/2013    Plan    Laparoscopic peritoneal dialysis catheter placement.  Risks discussed which include but are not limited to  anesthesia, bleeding, infection, catheter dysfunction or displacement.  Unanticipated scarring or adhesions that may add to requiring additional surgery and potential additional intra-abdominal injury.  Anticipate possibility of requiring an omentopexy. We described the procedure in detail, and I believe the patient and his accompanying family member have had their questions adequately answered.  And they desire to proceed.  No further questions, no guarantees ever expressed or implied.  Face-to-face time spent with the patient and accompanying care providers(if present) was 45 minutes, with more than 50% of the time spent counseling, educating, and coordinating care of the patient.    These notes generated with voice recognition software. I apologize for typographical errors.  Ronny Bacon M.D., FACS 10/25/2021, 12:03 PM

## 2021-10-27 ENCOUNTER — Encounter: Payer: Self-pay | Admitting: Surgery

## 2021-10-27 ENCOUNTER — Telehealth: Payer: Self-pay

## 2021-10-27 ENCOUNTER — Other Ambulatory Visit: Payer: Self-pay

## 2021-10-27 ENCOUNTER — Encounter
Admission: RE | Admit: 2021-10-27 | Discharge: 2021-10-27 | Disposition: A | Discharge status: Home / Self Care | Payer: Medicare Other | Source: Ambulatory Visit | Attending: Surgery | Admitting: Surgery

## 2021-10-27 VITALS — Ht 69.0 in | Wt 158.0 lb

## 2021-10-27 DIAGNOSIS — I10 Essential (primary) hypertension: Secondary | ICD-10-CM

## 2021-10-27 DIAGNOSIS — I2510 Atherosclerotic heart disease of native coronary artery without angina pectoris: Secondary | ICD-10-CM

## 2021-10-27 DIAGNOSIS — Z01818 Encounter for other preprocedural examination: Secondary | ICD-10-CM

## 2021-10-27 DIAGNOSIS — N184 Chronic kidney disease, stage 4 (severe): Secondary | ICD-10-CM

## 2021-10-27 NOTE — Telephone Encounter (Signed)
   Pre-operative Risk Assessment    Patient Name: Troy Chavez  DOB: 1945/08/30 MRN: [id number]      Request for Surgical Clearance    Procedure:  LAPAROSCOPIC INSERTION CONTINUOUS AMBULATORY PERITONEAL DIALYSIS  (CAPD) CATHETER    Date of Surgery:  Clearance 10/31/21                                 Surgeon:  Dr. Ronny Bacon, MD  Surgeon's Group or Practice Name:  Phone number:  [phone]  Fax number:  [phone]    Type of Clearance Requested:   - Medical    Type of Anesthesia:  General    Additional requests/questions:    SignedJacinta Shoe   10/27/2021, 4:34 PM

## 2021-10-27 NOTE — Telephone Encounter (Signed)
  Patient Consent for Virtual Visit        Troy Chavez has provided verbal consent on 10/27/2021 for a virtual visit (video or telephone).   CONSENT FOR VIRTUAL VISIT FOR:  Troy Chavez  By participating in this virtual visit I agree to the following:  I hereby voluntarily request, consent and authorize [facility] and its employed or contracted physicians, physician assistants, nurse practitioners or other licensed health care professionals (the Practitioner), to provide me with telemedicine health care services (the "Services") as deemed necessary by the treating Practitioner. I acknowledge and consent to receive the Services by the Practitioner via telemedicine. I understand that the telemedicine visit will involve communicating with the Practitioner through live audiovisual communication technology and the disclosure of certain medical information by electronic transmission. I acknowledge that I have been given the opportunity to request an in-person assessment or other available alternative prior to the telemedicine visit and am voluntarily participating in the telemedicine visit.  I understand that I have the right to withhold or withdraw my consent to the use of telemedicine in the course of my care at any time, without affecting my right to future care or treatment, and that the Practitioner or I may terminate the telemedicine visit at any time. I understand that I have the right to inspect all information obtained and/or recorded in the course of the telemedicine visit and may receive copies of available information for a reasonable fee.  I understand that some of the potential risks of receiving the Services via telemedicine include:  Delay or interruption in medical evaluation due to technological equipment failure or disruption; Information transmitted may not be sufficient (e.g. poor resolution of images) to allow for appropriate medical decision making by the Practitioner;  and/or  In rare instances, security protocols could fail, causing a breach of personal health information.  Furthermore, I acknowledge that it is my responsibility to provide information about my medical history, conditions and care that is complete and accurate to the best of my ability. I acknowledge that Practitioner's advice, recommendations, and/or decision may be based on factors not within their control, such as incomplete or inaccurate data provided by me or distortions of diagnostic images or specimens that may result from electronic transmissions. I understand that the practice of medicine is not an exact science and that Practitioner makes no warranties or guarantees regarding treatment outcomes. I acknowledge that a copy of this consent can be made available to me via my patient portal ([facility]), or I can request a printed copy by calling the office of [facility].    I understand that my insurance will be billed for this visit.   I have read or had this consent read to me. I understand the contents of this consent, which adequately explains the benefits and risks of the Services being provided via telemedicine.  I have been provided ample opportunity to ask questions regarding this consent and the Services and have had my questions answered to my satisfaction. I give my informed consent for the services to be provided through the use of telemedicine in my medical care

## 2021-10-27 NOTE — Progress Notes (Incomplete)
Perioperative Services  Pre-Admission/Anesthesia Testing Clinical Review  Date: 10/27/21  Patient Demographics:  Name: Troy Chavez DOB:   1946-03-01 MRN:   [id number]  Planned Surgical Procedure(s):    Case: [id number] Date/Time: 10/31/21 0715   Procedure: LAPAROSCOPIC INSERTION CONTINUOUS AMBULATORY PERITONEAL DIALYSIS  (CAPD) CATHETER   Anesthesia type: General   Pre-op diagnosis: CKD   Location: [facility] OR ROOM 05 / [place] [facility] FOR ANESTHESIA GROUP   Surgeons: Ronny Bacon, MD   NOTE: Available PAT nursing documentation and vital signs have been reviewed. Clinical nursing staff has updated patient's PMH/PSHx, current medication list, and drug allergies/intolerances to ensure comprehensive history available to assist in medical decision making as it pertains to the aforementioned surgical procedure and anticipated anesthetic course. Extensive review of available clinical information performed. Superior PMH and PSHx updated with any diagnoses/procedures that  may have been inadvertently omitted during his intake with the pre-admission testing department's nursing staff.  Clinical Discussion:  Troy Chavez is a 76 y.o. male who is submitted for pre-surgical anesthesia review and clearance prior to him undergoing the above procedure. Patient is a Former Research scientist (life sciences). Pertinent PMH includes: CAD, lateral STEMI, angina, aortic root/ascending aorta dilation, diastolic dysfunction, HTN, HLD, CKD-V, GERD (on daily PPI), OA, BPH, Alzheimer's disease, followed by palliative care service..  Patient is followed by cardiology Fletcher Anon, MD). He was last seen in the cardiology clinic on 06/22/2021; notes reviewed.  At the time of his clinic visit, patient doing well overall from a cardiovascular perspective.  He denied any chest pain, short of breath, PND, orthopnea, palpitations, vertiginous symptoms, or presyncope/syncope.  Patient with occasional BILATERAL ankle edema that resolved with elevation.  PMH  significant for cardiovascular diagnoses.  Patient suffered a lateral wall STEMI on 06/05/2017.  Diagnostic left heart catheterization performed revealing a normal LVEDP.  Angiography of the was deferred due to patient's advanced CKD diagnosis.  There was multivessel CAD noted; 60% proximal LAD, 70% mid LAD, and 90% ostial ramus intermedius-ramus intermedius.  PCI was performed placing a 2.50 x 15 mm Sierra DES to the ramus intermedius.  Of note, cardiology noted to avoid RIGHT radial approach in the future due to severe tortuosity of the innominate artery.  TTE performed on 05/29/2017 revealed a mildly reduced left ventricular systolic function with an EF of 45-50%.  There was hypokinesis of the anterior and anterolateral myocardium.  Diastolic Doppler parameters impaired relaxation (G1DD).  Aortic root (40 mm) and ascending aorta (38 mm) mildly dilated.  Blood pressure elevated 180/84 on currently prescribed CCB + vasodilator therapies; attributed to pain caused by hernia. Patient is on a statin + ezetimibe for his HLD diagnosis. He is not diabetic. Functional capacity, as defined by DASI, is documented as being >/= 4 METS. Norvasc dose was increased to 10 mg. No other changes were made to his medication regimen.  Patient to follow-up with outpatient cardiology in 6 months or sooner if needed.  Troy Chavez is scheduled for an elective LAPAROSCOPIC INSERTION CONTINUOUS AMBULATORY PERITONEAL DIALYSIS  (CAPD) CATHETER on 10/31/2021 with Dr. Ronny Bacon, MD. Given patient's past medical history significant for cardiovascular diagnoses, presurgical cardiac clearance was sought by the PAT team.  *** In review of his medication reconciliation, it is noted the patient is not currently taking any type of anticoagulation or antiplatelet therapies that will need to be held during the perioperative period.  Patient denies previous perioperative complications with anesthesia in the past. In review of the EMR,  it is noted that  patient underwent a general anesthetic course here at [facility] in 06/2021 with no documented complications.     10/27/2021    9:10 AM 10/25/2021   10:50 AM 07/26/2021    8:46 PM  Vitals with BMI  Height 5' 9" 5' 9"   Weight 158 lbs 158 lbs 6 oz   BMI 38.10 17.51   Systolic  025 852  Diastolic  778 99  Pulse  79 79    Providers/Specialists:   NOTE: Primary physician provider listed below. Patient may have been seen by APP or partner within same practice.   PROVIDER ROLE / SPECIALTY LAST Katy Apo, MD General Surgery 10/25/2021  Rusty Aus, MD Primary Care Provider 09/21/2021  Kathlyn Sacramento, MD Cardiology 06/22/2021  Murlean Iba, MD Nephrology 10/20/2021  Charlann Boxer, NP-C Palliative Care 10/13/2021   Allergies:  Codeine  Current Home Medications:   No current facility-administered medications for this encounter.    amLODipine (NORVASC) 10 MG tablet   atorvastatin (LIPITOR) 20 MG tablet   colchicine 0.6 MG tablet   hydrALAZINE (APRESOLINE) 25 MG tablet   meclizine (ANTIVERT) 25 MG tablet   pantoprazole (PROTONIX) 40 MG tablet   SUMAtriptan (IMITREX) 50 MG tablet   traMADol (ULTRAM) 50 MG tablet   History:   Past Medical History:  Diagnosis Date   Alzheimer's disease ([place])    Anginal pain ([place])    Aortic dilatation ([place]) 05/29/2017   a.) TTE 05/29/2017: Aortic root measured 40 mm; ascending aorta measured 38 mm.   Arthritis    BPH (benign prostatic hyperplasia)    CKD (chronic kidney disease), stage V (HCC)    Coronary artery disease 05/28/2017   a.) lateral STEMI 05/28/2017 --> LHC: 60% pLAD, 70% mLAD, 242% oRI-RI   Diastolic dysfunction 35/36/1443   a.)  TTE: LVEF 45-50%; anterior and anterolateral HK; G1DD.   Followed by palliative care service    GERD (gastroesophageal reflux disease)    Gout    HLD (hyperlipidemia)    Hypertension    Ischemic cardiomyopathy 05/29/2017    a.) TTE 05/29/2017: EF 45-50%; anterior and anterolateral HK; G1DD.   Migraines    ST elevation myocardial infarction (STEMI) of lateral wall ([place]) 05/28/2017   a.) LHC 05/28/2017: LVEDP normal; angio deferred due to CKD-IV; 60% pLAD, 70% mLAD, 100% oRI-RI --> PCI placing a 2.50 x 15 mm Sierra DES to RI; avoid RIGHT radial approach in the future due to severe tortuosity of innominate artery.   Past Surgical History:  Procedure Laterality Date   CARDIAC CATHETERIZATION     COLONOSCOPY     CORONARY/GRAFT ACUTE MI REVASCULARIZATION N/A 05/28/2017   Procedure: Coronary/Graft Acute MI Revascularization;  Surgeon: Wellington Hampshire, MD;  Location: [place] [facility];  Service: Cardiovascular;  Laterality: N/A;   EYE SURGERY     HERNIA REPAIR Right    INSERTION OF MESH  06/23/2021   Procedure: INSERTION OF MESH;  Surgeon: Benjamine Sprague, DO;  Location: [facility] ORS;  Service: General;;   LEFT HEART CATH AND CORONARY ANGIOGRAPHY N/A 05/28/2017   Procedure: LEFT HEART CATH AND CORONARY ANGIOGRAPHY;  Surgeon: Wellington Hampshire, MD;  Location: [place] [facility];  Service: Cardiovascular;  Laterality: N/A;   Family History  Problem Relation Age of Onset   Dementia Mother    Rheum arthritis Father    Social History   Tobacco Use   Smoking status: Former   Smokeless tobacco: Never  Tobacco comments:    Quit 35 yeaers ago  Vaping Use   Vaping Use: Never used  Substance Use Topics   Alcohol use: Not Currently    Comment: rare drinking for special occasions   Drug use: No    Pertinent Clinical Results:  LABS: Labs reviewed: Acceptable for surgery.   Ref Range & Units 10/20/2021  WBC 3.8 - 10.8 Thousand/uL 4.8   RBC 4.20 - 5.80 Million/uL 2.97 Low    Hemoglobin 13.2 - 17.1 g/dL 9.0 Low    Hematocrit 38.5 - 50.0 % 27.4 Low    MCV 80.0 - 100.0 fL 92.3   MCH 27.0 - 33.0 pg 30.3   MCHC 32.0 - 36.0 g/dL 32.8   RDW 11.0 - 15.0 % 12.3   Platelets 140 - 400 Thousand/uL 202   MPV 7.5 - 12.5  fL 11.1   Resulting Agency  [facility]  Specimen Collected: 10/20/21 11:32   Performed by: Yvetta Coder Last Resulted: 10/21/21 09:26  Received From: [facility]  Result Received: 10/24/21 16:29    Ref Range & Units 10/20/2021  Glucose 65 - 99 mg/dL 66   BUN 7 - 25 mg/dL 83 High    Creatinine 0.70 - 1.28 mg/dL 6.72 High    eGFR CKD-EPI  > OR = 60 mL/min/1.46m 8 Low    BUN/Creatinine Ratio 6 - 22 (calc) 12   Sodium 135 - 146 mmol/L 140   Potassium 3.5 - 5.3 mmol/L 4.8   Chloride 98 - 110 mmol/L 109   Bicarbonate (CO2) 20 - 32 mmol/L 20   Calcium 8.6 - 10.3 mg/dL 8.6   Phosphorus 2.1 - 4.3 mg/dL 6.0 High    Albumin 3.6 - 5.1 g/dL 4.1   Resulting Agency  [facility]  Specimen Collected: 10/20/21 11:32   Performed by: QYvetta CoderLast Resulted: 10/21/21 09:26  [facility]  Result Received: 10/24/21 16:29    ECG: Date: 003/21/2023 Time ECG obtained: 1706 PM Rate: 86 bpm Rhythm: normal sinus Axis (leads I and aVF): Normal Intervals: PR 160 ms. QRS 76 ms. QTc 428 ms. ST segment and T wave changes: No evidence of acute ST segment elevation or depression Comparison: Similar to previous tracing obtained on 06/21/2021   IMAGING / PROCEDURES: DIAGNOSTIC RADIOGRAPHS OF CHEST 2 VIEWS performed on 07/26/2021 The heart size and mediastinal contours are within normal limits. Both lungs are clear.  The visualized skeletal structures are unremarkable. No active cardiopulmonary disease.  TRANSTHORACIC ECHOCARDIOGRAM performed on 05/29/2017 LVEF 45-50% Mildly reduced left ventricular systolic function Hypokinesis of the anterior and anterolateral myocardium Diastolic Doppler parameters consistent with abnormal left ventricular relaxation (G1DD). Aortic root mildly dilated at 40 mm Ascending aorta mildly dilated at 38 mm Right ventricular systolic function normal PASP in the range of 25-30 mmHg  LEFT HEART CATHETERIZATION AND CORONARY ANGIOGRAPHY  performed on 05/28/2017 Lateral STEMI Multivessel CAD 60% stenosis of the proximal LAD 70% stenosis of the mid LAD 100% stenosis of the ostial ramus intermedius to ramus intermedius LVEDP normal Left ventricular angiography was not performed due to chronic kidney disease Successful PCI 2.50 x 15 mm Sierra DES placed to the ramus intermedius Recommendations DAPT therapy for at least 1 year Consider stress testing in about 6 weeks to evaluate for ischemia in the LAD distribution Obtain echocardiogram to evaluate LV function Hydration for 12 hours to decrease the chance of contrast-induced nephropathy.  A total of 110 mils of contrast was used. Avoid future catheterization via the RIGHT radial  artery due to severe tortuosity of the innominate artery  Impression and Plan:  LADELL LEA has been referred for pre-anesthesia review and clearance prior to him undergoing the planned anesthetic and procedural courses. Available labs, pertinent testing, and imaging results were personally reviewed by me. This patient has been appropriately cleared by cardiology with an overall *** risk of significant perioperative cardiovascular complications.  Based on clinical review performed today (10/27/21), barring any significant acute changes in the patient's overall condition, it is anticipated that he will be able to proceed with the planned surgical intervention. Any acute changes in clinical condition may necessitate his procedure being postponed and/or cancelled. Patient will meet with anesthesia team (MD and/or CRNA) on the day of his procedure for preoperative evaluation/assessment. Questions regarding anesthetic course will be fielded at that time.   Pre-surgical instructions were reviewed with the patient during his PAT appointment and questions were fielded by PAT clinical staff. Patient was advised that if any questions or concerns arise prior to his procedure then he should return a call to PAT  and/or his surgeon's office to discuss.  Honor Loh, MSN, APRN, FNP-C, CEN [facility]  Peri-operative Services Nurse Practitioner Phone: [phone] Fax: [phone] 10/27/21 3:20 PM  NOTE: This note has been prepared using Dragon dictation software. Despite my best ability to proofread, there is always the potential that unintentional transcriptional errors may still occur from this process.

## 2021-10-27 NOTE — Telephone Encounter (Signed)
   Name: Troy Chavez  DOB: 03/08/1946  MRN: [id number]  Primary Cardiologist: Kathlyn Sacramento, MD  Chart reviewed as part of pre-operative protocol coverage. Because of Troy Chavez's past medical history and time since last visit, he will require a follow-up tele visit in order to better assess preoperative cardiovascular risk.  Pre-op covering staff: - Please schedule appointment and call patient to inform them. If patient already had an upcoming appointment within acceptable timeframe, please add "pre-op clearance" to the appointment notes so provider is aware. - Please contact requesting surgeon's office via preferred method (i.e, phone, fax) to inform them of need for appointment prior to surgery.  Only medical clearance requested.  Elgie Collard, PA-C  10/27/2021, 5:11 PM

## 2021-10-27 NOTE — Patient Instructions (Addendum)
Your procedure is scheduled on: Monday 10/31/21 Report to the Registration Desk on the 1st floor of the [facility]. To find out your arrival time, please call [phone] between 1PM - 3PM on: Friday 10/28/21 If your arrival time is 6:00 am, do not arrive prior to that time as the [place] entrance doors do not open until 6:00 am.  REMEMBER: Instructions that are not followed completely may result in serious medical risk, up to and including death; or upon the discretion of your surgeon and anesthesiologist your surgery may need to be rescheduled.  Do not eat or drink after midnight the night before surgery.  No gum chewing, lozengers or hard candies.  TAKE THESE MEDICATIONS THE MORNING OF SURGERY WITH A SIP OF WATER: pantoprazole (PROTONIX) 40 MG tablet (take one the night before and one on the morning of surgery - helps to prevent nausea after surgery.)  One week prior to surgery: Stop Anti-inflammatories (NSAIDS) such as Advil, Aleve, Ibuprofen, Motrin, Naproxen, Naprosyn and Aspirin based products such as Excedrin, Goodys Powder, BC Powder.  Stop ANY OVER THE COUNTER supplements until after surgery.  You may however, continue to take Tylenol if needed for pain up until the day of surgery.  No Alcohol for 24 hours before or after surgery.  No Smoking including e-cigarettes for 24 hours prior to surgery.  No chewable tobacco products for at least 6 hours prior to surgery.  No nicotine patches on the day of surgery.  Do not use any "recreational" drugs for at least a week prior to your surgery.  Please be advised that the combination of cocaine and anesthesia may have negative outcomes, up to and including death. If you test positive for cocaine, your surgery will be cancelled.  On the morning of surgery brush your teeth with toothpaste and water, you may rinse your mouth with mouthwash if you wish. Do not swallow any toothpaste or mouthwash.  Use Dial soap as directed on  instruction sheet.  Do not wear jewelry.  Do not wear lotions, powders, or colognes.   Do not shave body from the neck down 48 hours prior to surgery just in case you cut yourself which could leave a site for infection.   Contact lenses, hearing aids and dentures may not be worn into surgery.  Do not bring valuables to the hospital. [facility] is not responsible for any missing/lost belongings or valuables.   Notify your doctor if there is any change in your medical condition (cold, fever, infection).  Wear comfortable clothing (specific to your surgery type) to the hospital.  After surgery, you can help prevent lung complications by doing breathing exercises.  Take deep breaths and cough every 1-2 hours.  If you are being discharged the day of surgery, you will not be allowed to drive home. You will need a responsible adult (18 years or older) to drive you home and stay with you that night.   If you are taking public transportation, you will need to have a responsible adult (18 years or older) with you. Please confirm with your physician that it is acceptable to use public transportation.   Please call the [place] Dept. at [phone] if you have any questions about these instructions.  Surgery Visitation Policy:  Patients undergoing a surgery or procedure may have two family members or support persons with them as long as the person is not COVID-19 positive or experiencing its symptoms.   Inpatient Visitation:    Visiting  hours are 7 a.m. to 8 p.m. Up to four visitors are allowed at one time in a patient room, including children. The visitors may rotate out with other people during the day. One designated support person (adult) may remain overnight.

## 2021-10-27 NOTE — Telephone Encounter (Signed)
Spoke with patient who is agreeable to do a virtual tele call on 6/23 at 11:20 am. Med rec and consent are done. Patient thanked me for the call.  I will route recommendations to requesting provider's office.

## 2021-10-28 ENCOUNTER — Ambulatory Visit (INDEPENDENT_AMBULATORY_CARE_PROVIDER_SITE_OTHER): Payer: Medicare Other | Admitting: Physician Assistant

## 2021-10-28 DIAGNOSIS — Z01810 Encounter for preprocedural cardiovascular examination: Secondary | ICD-10-CM

## 2021-10-30 MED ORDER — LACTATED RINGERS IV SOLN: INTRAVENOUS | Status: DC

## 2021-10-30 MED ORDER — ACETAMINOPHEN 500 MG PO TABS: 1000.0000 mg | ORAL_TABLET | ORAL | Status: AC

## 2021-10-30 MED ORDER — CHLORHEXIDINE GLUCONATE 0.12 % MT SOLN: 15.0000 mL | Freq: Once | OROMUCOSAL | Status: AC

## 2021-10-30 MED ORDER — ORAL CARE MOUTH RINSE: 15.0000 mL | Freq: Once | OROMUCOSAL | Status: AC

## 2021-10-30 MED ORDER — BUPIVACAINE LIPOSOME 1.3 % IJ SUSP: 20.0000 mL | Freq: Once | INTRAMUSCULAR | Status: DC

## 2021-10-30 MED ORDER — CELECOXIB 200 MG PO CAPS: 200.0000 mg | ORAL_CAPSULE | ORAL | Status: AC

## 2021-10-30 MED ORDER — GABAPENTIN 300 MG PO CAPS: 300.0000 mg | ORAL_CAPSULE | ORAL | Status: AC

## 2021-10-30 MED ORDER — CHLORHEXIDINE GLUCONATE CLOTH 2 % EX PADS: 6.0000 | MEDICATED_PAD | Freq: Once | CUTANEOUS | Status: DC

## 2021-10-30 MED ORDER — CHLORHEXIDINE GLUCONATE CLOTH 2 % EX PADS
6.0000 | MEDICATED_PAD | Freq: Once | CUTANEOUS | Status: AC
  Administered 2021-10-31: 6 via TOPICAL

## 2021-10-30 MED ORDER — CEFAZOLIN SODIUM-DEXTROSE 2-4 GM/100ML-% IV SOLN
2.0000 g | INTRAVENOUS | Status: AC
  Administered 2021-10-31: 2 g via INTRAVENOUS

## 2021-10-31 ENCOUNTER — Other Ambulatory Visit: Payer: Self-pay

## 2021-10-31 ENCOUNTER — Telehealth: Payer: Self-pay

## 2021-10-31 ENCOUNTER — Ambulatory Visit: Payer: Medicare Other | Admitting: Urgent Care

## 2021-10-31 ENCOUNTER — Ambulatory Visit
Admission: RE | Admit: 2021-10-31 | Discharge: 2021-10-31 | Disposition: A | Discharge status: Home / Self Care | Payer: Medicare Other | Attending: Surgery | Admitting: Surgery

## 2021-10-31 ENCOUNTER — Encounter
Admission: RE | Disposition: A | Discharge status: Home / Self Care | Payer: Self-pay | Source: Home / Self Care | Attending: Surgery

## 2021-10-31 ENCOUNTER — Encounter: Payer: Self-pay | Admitting: Surgery

## 2021-10-31 DIAGNOSIS — I120 Hypertensive chronic kidney disease with stage 5 chronic kidney disease or end stage renal disease: Secondary | ICD-10-CM | POA: Diagnosis present

## 2021-10-31 DIAGNOSIS — G309 Alzheimer's disease, unspecified: Secondary | ICD-10-CM | POA: Insufficient documentation

## 2021-10-31 DIAGNOSIS — N184 Chronic kidney disease, stage 4 (severe): Secondary | ICD-10-CM | POA: Diagnosis not present

## 2021-10-31 DIAGNOSIS — K219 Gastro-esophageal reflux disease without esophagitis: Secondary | ICD-10-CM | POA: Insufficient documentation

## 2021-10-31 DIAGNOSIS — Z992 Dependence on renal dialysis: Secondary | ICD-10-CM | POA: Diagnosis not present

## 2021-10-31 DIAGNOSIS — I10 Essential (primary) hypertension: Secondary | ICD-10-CM

## 2021-10-31 DIAGNOSIS — I252 Old myocardial infarction: Secondary | ICD-10-CM | POA: Insufficient documentation

## 2021-10-31 DIAGNOSIS — E785 Hyperlipidemia, unspecified: Secondary | ICD-10-CM | POA: Diagnosis not present

## 2021-10-31 DIAGNOSIS — N186 End stage renal disease: Secondary | ICD-10-CM | POA: Insufficient documentation

## 2021-10-31 DIAGNOSIS — Z87891 Personal history of nicotine dependence: Secondary | ICD-10-CM | POA: Insufficient documentation

## 2021-10-31 DIAGNOSIS — I2510 Atherosclerotic heart disease of native coronary artery without angina pectoris: Secondary | ICD-10-CM | POA: Diagnosis not present

## 2021-10-31 DIAGNOSIS — Z01818 Encounter for other preprocedural examination: Secondary | ICD-10-CM

## 2021-10-31 DIAGNOSIS — I129 Hypertensive chronic kidney disease with stage 1 through stage 4 chronic kidney disease, or unspecified chronic kidney disease: Secondary | ICD-10-CM

## 2021-10-31 HISTORY — PX: CAPD INSERTION: SHX5233

## 2021-10-31 HISTORY — DX: Encounter for palliative care: Z51.5

## 2021-10-31 LAB — POCT I-STAT, CHEM 8
BUN: 89 mg/dL — ABNORMAL HIGH (ref 8–23)
Calcium, Ion: 1.24 mmol/L (ref 1.15–1.40)
Chloride: 115 mmol/L — ABNORMAL HIGH (ref 98–111)
Creatinine, Ser: 8.8 mg/dL — ABNORMAL HIGH (ref 0.61–1.24)
Glucose, Bld: 107 mg/dL — ABNORMAL HIGH (ref 70–99)
HCT: 27 % — ABNORMAL LOW (ref 39.0–52.0)
Hemoglobin: 9.2 g/dL — ABNORMAL LOW (ref 13.0–17.0)
Potassium: 4.5 mmol/L (ref 3.5–5.1)
Sodium: 145 mmol/L (ref 135–145)
TCO2: 17 mmol/L — ABNORMAL LOW (ref 22–32)

## 2021-10-31 SURGERY — LAPAROSCOPIC INSERTION CONTINUOUS AMBULATORY PERITONEAL DIALYSIS  (CAPD) CATHETER
Anesthesia: General | Site: Abdomen

## 2021-10-31 MED ORDER — PHENYLEPHRINE 80 MCG/ML (10ML) SYRINGE FOR IV PUSH (FOR BLOOD PRESSURE SUPPORT)
PREFILLED_SYRINGE | INTRAVENOUS | Status: DC | PRN
  Administered 2021-10-31: 80 ug via INTRAVENOUS

## 2021-10-31 MED ORDER — ONDANSETRON HCL 4 MG/2ML IJ SOLN
INTRAMUSCULAR | Status: DC | PRN
  Administered 2021-10-31: 4 mg via INTRAVENOUS

## 2021-10-31 MED ORDER — LACTATED RINGERS IV SOLN: INTRAVENOUS | Status: DC | PRN

## 2021-10-31 MED ORDER — FENTANYL CITRATE (PF) 100 MCG/2ML IJ SOLN
INTRAMUSCULAR | Status: DC | PRN
  Administered 2021-10-31 (×2): 25 ug via INTRAVENOUS
  Administered 2021-10-31: 50 ug via INTRAVENOUS

## 2021-10-31 MED ORDER — ROCURONIUM BROMIDE 100 MG/10ML IV SOLN
INTRAVENOUS | Status: DC | PRN
  Administered 2021-10-31: 50 mg via INTRAVENOUS

## 2021-10-31 MED ORDER — CELECOXIB 200 MG PO CAPS
ORAL_CAPSULE | ORAL | Status: AC
  Administered 2021-10-31: 200 mg via ORAL
  Filled 2021-10-31: qty 1

## 2021-10-31 MED ORDER — PHENYLEPHRINE HCL-NACL 20-0.9 MG/250ML-% IV SOLN
INTRAVENOUS | Status: DC | PRN
  Administered 2021-10-31: 50 ug/min via INTRAVENOUS

## 2021-10-31 MED ORDER — CEFAZOLIN SODIUM-DEXTROSE 2-4 GM/100ML-% IV SOLN
INTRAVENOUS | Status: AC
  Filled 2021-10-31: qty 100

## 2021-10-31 MED ORDER — DEXAMETHASONE SODIUM PHOSPHATE 10 MG/ML IJ SOLN
INTRAMUSCULAR | Status: DC | PRN
  Administered 2021-10-31: 8 mg via INTRAVENOUS

## 2021-10-31 MED ORDER — PROPOFOL 10 MG/ML IV BOLUS
INTRAVENOUS | Status: DC | PRN
  Administered 2021-10-31: 100 mg via INTRAVENOUS

## 2021-10-31 MED ORDER — CHLORHEXIDINE GLUCONATE 0.12 % MT SOLN
OROMUCOSAL | Status: AC
  Administered 2021-10-31: 15 mL via OROMUCOSAL
  Filled 2021-10-31: qty 15

## 2021-10-31 MED ORDER — BUPIVACAINE-EPINEPHRINE (PF) 0.25% -1:200000 IJ SOLN
INTRAMUSCULAR | Status: AC
  Filled 2021-10-31: qty 30

## 2021-10-31 MED ORDER — ACETAMINOPHEN 500 MG PO TABS
ORAL_TABLET | ORAL | Status: AC
  Administered 2021-10-31: 1000 mg via ORAL
  Filled 2021-10-31: qty 2

## 2021-10-31 MED ORDER — 0.9 % SODIUM CHLORIDE (POUR BTL) OPTIME
TOPICAL | Status: DC | PRN
  Administered 2021-10-31: 1000 mL

## 2021-10-31 MED ORDER — FENTANYL CITRATE (PF) 100 MCG/2ML IJ SOLN: 25.0000 ug | INTRAMUSCULAR | Status: DC | PRN

## 2021-10-31 MED ORDER — FENTANYL CITRATE (PF) 100 MCG/2ML IJ SOLN
INTRAMUSCULAR | Status: AC
  Filled 2021-10-31: qty 2

## 2021-10-31 MED ORDER — EPHEDRINE SULFATE (PRESSORS) 50 MG/ML IJ SOLN
INTRAMUSCULAR | Status: DC | PRN
  Administered 2021-10-31 (×2): 10 mg via INTRAVENOUS

## 2021-10-31 MED ORDER — PROPOFOL 1000 MG/100ML IV EMUL
INTRAVENOUS | Status: AC
  Filled 2021-10-31: qty 100

## 2021-10-31 MED ORDER — BUPIVACAINE-EPINEPHRINE (PF) 0.25% -1:200000 IJ SOLN
INTRAMUSCULAR | Status: DC | PRN
  Administered 2021-10-31: 44 mL

## 2021-10-31 MED ORDER — GLYCOPYRROLATE 0.2 MG/ML IJ SOLN
INTRAMUSCULAR | Status: DC | PRN
  Administered 2021-10-31: .6 mg via INTRAVENOUS

## 2021-10-31 MED ORDER — GABAPENTIN 300 MG PO CAPS
ORAL_CAPSULE | ORAL | Status: AC
  Administered 2021-10-31: 300 mg via ORAL
  Filled 2021-10-31: qty 1

## 2021-10-31 MED ORDER — PHENYLEPHRINE HCL (PRESSORS) 10 MG/ML IV SOLN
INTRAVENOUS | Status: DC | PRN
  Administered 2021-10-31: 80 ug via INTRAVENOUS

## 2021-10-31 MED ORDER — ONDANSETRON HCL 4 MG/2ML IJ SOLN
4.0000 mg | Freq: Once | INTRAMUSCULAR | Status: DC | PRN
Start: 2021-10-31 — End: 2021-10-31

## 2021-10-31 MED ORDER — NEOSTIGMINE METHYLSULFATE 10 MG/10ML IV SOLN
INTRAVENOUS | Status: DC | PRN
  Administered 2021-10-31: 4 mg via INTRAVENOUS

## 2021-10-31 MED ORDER — LIDOCAINE HCL (CARDIAC) PF 100 MG/5ML IV SOSY
PREFILLED_SYRINGE | INTRAVENOUS | Status: DC | PRN
  Administered 2021-10-31: 80 mg via INTRAVENOUS

## 2021-10-31 SURGICAL SUPPLY — 45 items
ADAPTER CATH DIALYSIS 4X8 IT L (MISCELLANEOUS) ×2 IMPLANT
ADH SKN CLS APL DERMABOND .7 (GAUZE/BANDAGES/DRESSINGS) ×1
BINDER ABDOMINAL 12 ML 46-62 (SOFTGOODS) ×1 IMPLANT
BIOPATCH WHT 1IN DISK W/4.0 H (GAUZE/BANDAGES/DRESSINGS) ×2 IMPLANT
BLADE CLIPPER SURG (BLADE) ×2 IMPLANT
CATH EXTENDED DIALYSIS (CATHETERS) ×2 IMPLANT
CORD MONOPOLAR M/FML 12FT (MISCELLANEOUS) ×1 IMPLANT
DERMABOND ADVANCED (GAUZE/BANDAGES/DRESSINGS) ×1
DERMABOND ADVANCED .7 DNX12 (GAUZE/BANDAGES/DRESSINGS) ×1 IMPLANT
ELECT CAUTERY BLADE 6.4 (BLADE) ×1 IMPLANT
ELECT REM PT RETURN 9FT ADLT (ELECTROSURGICAL) ×2
ELECTRODE REM PT RTRN 9FT ADLT (ELECTROSURGICAL) ×1 IMPLANT
GLOVE ORTHO TXT STRL SZ7.5 (GLOVE) ×2 IMPLANT
GOWN STRL REUS W/ TWL LRG LVL3 (GOWN DISPOSABLE) ×12 IMPLANT
GOWN STRL REUS W/ TWL XL LVL3 (GOWN DISPOSABLE) ×1 IMPLANT
GOWN STRL REUS W/TWL LRG LVL3 (GOWN DISPOSABLE) ×6
GOWN STRL REUS W/TWL XL LVL3 (GOWN DISPOSABLE) ×2
IV NS 1000ML (IV SOLUTION) ×2
IV NS 1000ML BAXH (IV SOLUTION) ×1 IMPLANT
KIT TURNOVER KIT A (KITS) ×2 IMPLANT
MANIFOLD NEPTUNE II (INSTRUMENTS) ×2 IMPLANT
MINICAP W/POVIDONE IODINE SOL (MISCELLANEOUS) ×2 IMPLANT
NDL INSUFFLATION 14GA 120MM (NEEDLE) IMPLANT
NEEDLE HYPO 22GX1.5 SAFETY (NEEDLE) ×2 IMPLANT
NEEDLE INSUFFLATION 14GA 120MM (NEEDLE) ×2 IMPLANT
NS IRRIG 500ML POUR BTL (IV SOLUTION) ×2 IMPLANT
PACK LAP CHOLECYSTECTOMY (MISCELLANEOUS) ×2 IMPLANT
PAD ABD DERMACEA PRESS 5X9 (GAUZE/BANDAGES/DRESSINGS) ×2 IMPLANT
PENCIL ELECTRO HAND CTR (MISCELLANEOUS) ×1 IMPLANT
SCISSORS METZENBAUM CVD 33 (INSTRUMENTS) ×1 IMPLANT
SET CYSTO W/LG BORE CLAMP LF (SET/KITS/TRAYS/PACK) ×2 IMPLANT
SET TRANSFER 6 W/TWIST CLAMP 5 (SET/KITS/TRAYS/PACK) ×2 IMPLANT
SET TUBE SMOKE EVAC HIGH FLOW (TUBING) ×2 IMPLANT
SPONGE DRAIN TRACH 4X4 STRL 2S (GAUZE/BANDAGES/DRESSINGS) ×2 IMPLANT
SUT MNCRL 4-0 (SUTURE) ×2
SUT MNCRL 4-0 27XMFL (SUTURE) ×1
SUT PROLENE 0 CT 2 (SUTURE) ×2 IMPLANT
SUT VIC AB 3-0 SH 27 (SUTURE) ×2
SUT VIC AB 3-0 SH 27X BRD (SUTURE) ×1 IMPLANT
SUT VICRYL 0 AB UR-6 (SUTURE) ×1 IMPLANT
SUTURE MNCRL 4-0 27XMF (SUTURE) ×1 IMPLANT
SYS KII FIOS ACCESS ABD 5X100 (TROCAR) ×2
SYSTEM KII FIOS ACES ABD 5X100 (TROCAR) ×1 IMPLANT
TROCAR Z-THREAD OPTICAL 5X100M (TROCAR) ×4 IMPLANT
WATER STERILE IRR 500ML POUR (IV SOLUTION) ×2 IMPLANT

## 2021-11-01 ENCOUNTER — Encounter: Payer: Self-pay | Admitting: Surgery

## 2021-11-15 ENCOUNTER — Encounter: Payer: Medicare Other | Admitting: Surgery

## 2021-11-23 ENCOUNTER — Emergency Department: Payer: Medicare Other

## 2021-11-23 ENCOUNTER — Observation Stay
Admission: EM | Admit: 2021-11-23 | Discharge: 2021-11-24 | Disposition: A | Discharge status: Hospice | Payer: Medicare Other | Attending: Internal Medicine | Admitting: Internal Medicine

## 2021-11-23 ENCOUNTER — Encounter: Payer: Self-pay | Admitting: *Deleted

## 2021-11-23 ENCOUNTER — Other Ambulatory Visit: Payer: Self-pay

## 2021-11-23 DIAGNOSIS — N184 Chronic kidney disease, stage 4 (severe): Secondary | ICD-10-CM | POA: Diagnosis present

## 2021-11-23 DIAGNOSIS — Z87891 Personal history of nicotine dependence: Secondary | ICD-10-CM | POA: Diagnosis not present

## 2021-11-23 DIAGNOSIS — I5030 Unspecified diastolic (congestive) heart failure: Secondary | ICD-10-CM | POA: Insufficient documentation

## 2021-11-23 DIAGNOSIS — N186 End stage renal disease: Secondary | ICD-10-CM | POA: Insufficient documentation

## 2021-11-23 DIAGNOSIS — Z992 Dependence on renal dialysis: Secondary | ICD-10-CM

## 2021-11-23 DIAGNOSIS — I2510 Atherosclerotic heart disease of native coronary artery without angina pectoris: Secondary | ICD-10-CM | POA: Diagnosis present

## 2021-11-23 DIAGNOSIS — Z7189 Other specified counseling: Secondary | ICD-10-CM | POA: Diagnosis not present

## 2021-11-23 DIAGNOSIS — A419 Sepsis, unspecified organism: Secondary | ICD-10-CM | POA: Insufficient documentation

## 2021-11-23 DIAGNOSIS — Z79899 Other long term (current) drug therapy: Secondary | ICD-10-CM | POA: Insufficient documentation

## 2021-11-23 DIAGNOSIS — Z515 Encounter for palliative care: Principal | ICD-10-CM

## 2021-11-23 DIAGNOSIS — M109 Gout, unspecified: Secondary | ICD-10-CM | POA: Diagnosis present

## 2021-11-23 DIAGNOSIS — R410 Disorientation, unspecified: Secondary | ICD-10-CM | POA: Diagnosis present

## 2021-11-23 DIAGNOSIS — E785 Hyperlipidemia, unspecified: Secondary | ICD-10-CM | POA: Diagnosis present

## 2021-11-23 DIAGNOSIS — F0280 Dementia in other diseases classified elsewhere without behavioral disturbance: Secondary | ICD-10-CM | POA: Diagnosis present

## 2021-11-23 DIAGNOSIS — K219 Gastro-esophageal reflux disease without esophagitis: Secondary | ICD-10-CM | POA: Diagnosis present

## 2021-11-23 DIAGNOSIS — G4700 Insomnia, unspecified: Secondary | ICD-10-CM | POA: Diagnosis present

## 2021-11-23 DIAGNOSIS — I132 Hypertensive heart and chronic kidney disease with heart failure and with stage 5 chronic kidney disease, or end stage renal disease: Secondary | ICD-10-CM | POA: Insufficient documentation

## 2021-11-23 DIAGNOSIS — G309 Alzheimer's disease, unspecified: Secondary | ICD-10-CM | POA: Insufficient documentation

## 2021-11-23 DIAGNOSIS — I10 Essential (primary) hypertension: Secondary | ICD-10-CM | POA: Diagnosis present

## 2021-11-23 DIAGNOSIS — R6510 Systemic inflammatory response syndrome (SIRS) of non-infectious origin without acute organ dysfunction: Secondary | ICD-10-CM | POA: Insufficient documentation

## 2021-11-23 LAB — APTT: aPTT: 37 seconds — ABNORMAL HIGH (ref 24–36)

## 2021-11-23 MED ORDER — ALBUTEROL SULFATE (2.5 MG/3ML) 0.083% IN NEBU: 2.5000 mg | INHALATION_SOLUTION | RESPIRATORY_TRACT | Status: DC | PRN

## 2021-11-23 MED ORDER — OXYCODONE HCL 5 MG PO TABS
5.0000 mg | ORAL_TABLET | ORAL | Status: DC | PRN
  Administered 2021-11-23 – 2021-11-24 (×2): 5 mg via ORAL
  Filled 2021-11-23 (×2): qty 1

## 2021-11-23 MED ORDER — PANTOPRAZOLE SODIUM 40 MG PO TBEC
40.0000 mg | DELAYED_RELEASE_TABLET | Freq: Every day | ORAL | Status: DC
  Administered 2021-11-24: 40 mg via ORAL
  Filled 2021-11-23: qty 1

## 2021-11-23 MED ORDER — HALOPERIDOL LACTATE 5 MG/ML IJ SOLN: 0.5000 mg | INTRAMUSCULAR | Status: DC | PRN

## 2021-11-23 MED ORDER — LORAZEPAM 2 MG/ML IJ SOLN: 1.0000 mg | INTRAMUSCULAR | Status: DC | PRN

## 2021-11-23 MED ORDER — LORAZEPAM 1 MG PO TABS: 1.0000 mg | ORAL_TABLET | ORAL | Status: DC | PRN

## 2021-11-23 MED ORDER — SENNA 8.6 MG PO TABS: 1.0000 | ORAL_TABLET | Freq: Every evening | ORAL | Status: DC | PRN

## 2021-11-23 MED ORDER — LORAZEPAM 2 MG/ML PO CONC: 1.0000 mg | ORAL | Status: DC | PRN

## 2021-11-23 MED ORDER — ACETAMINOPHEN 650 MG RE SUPP: 650.0000 mg | Freq: Four times a day (QID) | RECTAL | Status: DC | PRN

## 2021-11-23 MED ORDER — BISACODYL 10 MG RE SUPP: 10.0000 mg | Freq: Every day | RECTAL | Status: DC | PRN

## 2021-11-23 MED ORDER — SUMATRIPTAN SUCCINATE 50 MG PO TABS
50.0000 mg | ORAL_TABLET | ORAL | Status: DC | PRN
Start: 2021-11-23 — End: 2021-11-24

## 2021-11-23 MED ORDER — SODIUM CHLORIDE 0.9% FLUSH
3.0000 mL | Freq: Two times a day (BID) | INTRAVENOUS | Status: DC
  Administered 2021-11-23 – 2021-11-24 (×2): 3 mL via INTRAVENOUS

## 2021-11-23 MED ORDER — ACETAMINOPHEN 325 MG RE SUPP: 650.0000 mg | Freq: Four times a day (QID) | RECTAL | Status: DC | PRN

## 2021-11-23 MED ORDER — SODIUM CHLORIDE 0.9 % IV SOLN: INTRAVENOUS | Status: DC

## 2021-11-23 MED ORDER — ACETAMINOPHEN 325 MG PO TABS
650.0000 mg | ORAL_TABLET | Freq: Four times a day (QID) | ORAL | Status: DC | PRN
Start: 2021-11-23 — End: 2021-11-23
  Administered 2021-11-23: 650 mg via ORAL
  Filled 2021-11-23: qty 2

## 2021-11-23 MED ORDER — HALOPERIDOL LACTATE 2 MG/ML PO CONC
0.5000 mg | ORAL | Status: DC | PRN
  Filled 2021-11-23: qty 0.3

## 2021-11-23 MED ORDER — HALOPERIDOL 0.5 MG PO TABS: 0.5000 mg | ORAL_TABLET | ORAL | Status: DC | PRN

## 2021-11-23 MED ORDER — AMLODIPINE BESYLATE 10 MG PO TABS
10.0000 mg | ORAL_TABLET | Freq: Every day | ORAL | Status: DC
  Filled 2021-11-23: qty 1

## 2021-11-23 MED ORDER — DIPHENHYDRAMINE HCL 50 MG/ML IJ SOLN: 12.5000 mg | INTRAMUSCULAR | Status: DC | PRN

## 2021-11-23 MED ORDER — MECLIZINE HCL 25 MG PO TABS: 25.0000 mg | ORAL_TABLET | Freq: Three times a day (TID) | ORAL | Status: DC | PRN

## 2021-11-23 MED ORDER — ACETAMINOPHEN 325 MG PO TABS: 650.0000 mg | ORAL_TABLET | Freq: Four times a day (QID) | ORAL | Status: DC | PRN

## 2021-11-23 NOTE — ED Provider Notes (Signed)
[facility] Provider Note    Event Date/Time   First MD Initiated Contact with Patient 11/23/21 1708     (approximate)   History   Weakness   HPI  Troy Chavez is a 76 y.o. male with [place] history of CAD, CHF, HTN, HDL, GERD, BPH, Alzheimer's dementia and CKD recently started on peritoneal dialysis having completed his first session today for 2 hours he presents accompanied by spouse who is his POA for evaluation of some acute confusion.  Patient on her arrival is unable provide any additional history other than endorsing some mild abdominal pain.  Per spouse he has been declining steadily recently and that they recommend trying dialysis to see if it helps but that she feels he has been going through quite a lot.  No other acute sick symptoms per spouse.    Physical Exam  Triage Vital Signs: ED Triage Vitals  Enc Vitals Group     BP 11/23/21 1658 (!) 136/94     Pulse Rate 11/23/21 1658 (!) 129     Resp 11/23/21 1658 20     Temp 11/23/21 1658 (!) 101.8 F (38.8 C)     Temp Source 11/23/21 1658 Oral     SpO2 11/23/21 1658 99 %     Weight 11/23/21 1656 153 lb (69.4 kg)     Height 11/23/21 1656 '5\' 9"'$  (1.753 m)     Head Circumference --      Peak Flow --      Pain Score 11/23/21 1655 0     Pain Loc --      Pain Edu? --      Excl. in [place]? --     Most recent vital signs: Vitals:   11/23/21 1658 11/23/21 1730  BP: (!) 136/94 (!) 152/89  Pulse: (!) 129 (!) 120  Resp: 20 (!) 25  Temp: (!) 101.8 F (38.8 C)   SpO2: 99% 96%    General: Awake, confused and unable to follow commands.  Chronically ill-appearing. CV:  Cardiac with decreased cap refill in the digits. Resp:  Normal effort.  Tachypneic but clear bilaterally Abd:  No distention.  Mildly tender throughout.  Peritoneal dialysis catheter is in place Other:  Patient is not oriented and unable to reliably follow commands or provide any additional history.   ED Results / Procedures /  Treatments  Labs (all labs ordered are listed, but only abnormal results are displayed) Labs Reviewed  SARS CORONAVIRUS 2 BY RT PCR  APTT  PROCALCITONIN     EKG  ECG is markable sinus tachycardia with ventricular rate of 120, normal axis, unremarkable intervals without clear evidence of acute ischemia or significant arrhythmia   RADIOLOGY    PROCEDURES:  Critical Care performed: Yes, see critical care procedure note(s)  .1-3 Lead EKG Interpretation  Performed by: Lucrezia Starch, MD Authorized by: Lucrezia Starch, MD     Interpretation: non-specific     ECG rate assessment: tachycardic     Rhythm: sinus tachycardia     Ectopy: none     Conduction: normal   .Critical Care  Performed by: Lucrezia Starch, MD Authorized by: Lucrezia Starch, MD   Critical care provider statement:    Critical care time (minutes):  30   Critical care was necessary to treat or prevent imminent or life-threatening deterioration of the following conditions:  Sepsis   Critical care was time spent personally by me on the following activities:  Development of  treatment plan with patient or surrogate, evaluation of patient's response to treatment, examination of patient, ordering and performing treatments and interventions, pulse oximetry and review of old charts   The patient is on the cardiac monitor to evaluate for evidence of arrhythmia and/or significant heart rate changes.   MEDICATIONS ORDERED IN ED: Medications  acetaminophen (TYLENOL) tablet 650 mg (650 mg Oral Given 11/23/21 1736)    Or  acetaminophen (TYLENOL) suppository 650 mg ( Rectal See Alternative 11/23/21 1736)  oxyCODONE (Oxy IR/ROXICODONE) immediate release tablet 5 mg (5 mg Oral Given 11/23/21 1737)  haloperidol (HALDOL) tablet 0.5 mg (has no administration in time range)    Or  haloperidol (HALDOL) 2 MG/ML solution 0.5 mg (has no administration in time range)    Or  haloperidol lactate (HALDOL) injection 0.5 mg (has no  administration in time range)     IMPRESSION / MDM / ASSESSMENT AND PLAN / ED COURSE  I reviewed the triage vital signs and the nursing notes. Patient's presentation is most consistent with acute presentation with potential threat to life or bodily function.                               Differential diagnosis includes, but is not limited to sepsis as patient is tachycardic tachypneic and febrile on arrival possibly related to recent placement of peritoneal dialysis catheter, bacterial peritonitis, pneumonia, UTI versus other unclear source at this time.  After discussion with wife at bedside she states that she prefers for him to be made comfort care at this time and defer any additional life-prolonging interventions and focus solely on keeping patient comfortable.  We will place appropriate orders and admit to medicine service that he believes he can safely be complex the patient's home at this time.        FINAL CLINICAL IMPRESSION(S) / ED DIAGNOSES   Final diagnoses:  Sepsis, due to unspecified organism, unspecified whether acute organ dysfunction present [facility])  Goals of care, counseling/discussion     Rx / DC Orders   ED Discharge Orders     None        Note:  This document was prepared using Dragon voice recognition software and may include unintentional dictation errors.   Lucrezia Starch, MD 11/23/21 [phone]

## 2021-11-23 NOTE — ED Triage Notes (Signed)
Arrives via [facility].  Patient had dialysis for the first time today.   2 hours after treatment, patient c/o weakness, vomiting x 1.  Has history of dementia, but not following simple commands.    151/109 126 CBG:  143 98% RA

## 2021-11-23 NOTE — ED Triage Notes (Signed)
Pt brought in via ems from home.  Pt ha dialysis today for the first time. After treatment today, pt has weakness and vomiting.  Pt denies any pain   pt alert.  Speech clear.

## 2021-11-23 NOTE — H&P (Signed)
History and Physical   RA PFIESTER FIE:[id number] DOB: November 11, 1945 DOA: 11/23/2021  PCP: Rusty Aus, MD   Patient coming from: Home  Chief Complaint: Weakness, confusion  HPI: Troy Chavez is a 76 y.o. 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  History obtained with assistance of patient's family and chart review due to his dementia.  Patient has had steady decline recently and was started on a trial of peritoneal dialysis to see if this could help improve things.  He had his first session for 2 hours today and since has had some weakness and confusion.  EDP discussed his chronic illnesses and current condition with his wife as well as his recent decline and decision was made to make patient comfort care.  ED Course: Vital signs in the ED showed fever to 101.8, tachypnea in the 20s, heart rate in the 110s to 120s, blood pressure 166A to 630Z systolic.  Only lab work-up ordered was procalcitonin, COVID screen, PTT.  Patient received Tylenol, oxycodone, as needed Haldol in the ED.  As above during the course of his evaluation spouse who is POA has decided that with his steady decline and worsening with first trial of peritoneal dialysis now is the right time to pursue comfort measures and focus on his comfort only.  I placed a consult to palliative care.  Review of Systems: As per HPI otherwise all other systems reviewed and are negative.  Past Medical History:  Diagnosis Date   Acute ST elevation myocardial infarction (STEMI) of lateral wall ([place]) 05/28/2017   Alzheimer's disease ([place])    Anginal pain ([place])    Aortic dilatation ([place]) 05/29/2017   a.) TTE 05/29/2017: Aortic root measured 40 mm; ascending aorta measured 38 mm.   Arthritis    BPH (benign prostatic hyperplasia)    CKD (chronic  kidney disease), stage V (HCC)    Coronary artery disease 05/28/2017   a.) lateral STEMI 05/28/2017 --> LHC: 60% pLAD, 70% mLAD, 601% oRI-RI   Diastolic dysfunction 09/32/3557   a.)  TTE: LVEF 45-50%; anterior and anterolateral HK; G1DD.   Followed by palliative care service    GERD (gastroesophageal reflux disease)    Gout    History of anterolateral myocardial infarction 06/05/2017   Formatting of this note might be different from the original. DES rami 1/19 [place], anterolateral hypokinesis, EF 45-50%, peak troponin 21   HLD (hyperlipidemia)    Hypertension    Ischemic cardiomyopathy 05/29/2017   a.) TTE 05/29/2017: EF 45-50%; anterior and anterolateral HK; G1DD.   Migraines    ST elevation myocardial infarction (STEMI) of lateral wall ([place]) 05/28/2017   a.) LHC 05/28/2017: LVEDP normal; angio deferred due to CKD-IV; 60% pLAD, 70% mLAD, 100% oRI-RI --> PCI placing a 2.50 x 15 mm Sierra DES to RI; avoid RIGHT radial approach in the future due to severe tortuosity of innominate artery.    Past Surgical History:  Procedure Laterality Date   CAPD INSERTION N/A 10/31/2021   Procedure: LAPAROSCOPIC INSERTION CONTINUOUS AMBULATORY PERITONEAL DIALYSIS  (CAPD) CATHETER;  Surgeon: Ronny Bacon, MD;  Location: [facility] ORS;  Service: General;  Laterality: N/A;   CARDIAC CATHETERIZATION     COLONOSCOPY     CORONARY/GRAFT ACUTE MI REVASCULARIZATION N/A 05/28/2017   Procedure: Coronary/Graft Acute MI Revascularization;  Surgeon: Wellington Hampshire, MD;  Location: [place] [facility];  Service: Cardiovascular;  Laterality: N/A;   EYE SURGERY     HERNIA REPAIR Right    INSERTION OF MESH  06/23/2021   Procedure: INSERTION OF MESH;  Surgeon: Benjamine Sprague, DO;  Location: [facility] ORS;  Service: General;;   LEFT HEART CATH AND CORONARY ANGIOGRAPHY N/A 05/28/2017   Procedure: LEFT HEART CATH AND CORONARY ANGIOGRAPHY;  Surgeon: Wellington Hampshire, MD;  Location: [place] [facility];  Service: Cardiovascular;   Laterality: N/A;    Social History  reports that he has quit smoking. He has never used smokeless tobacco. He reports that he does not currently use alcohol. He reports that he does not use drugs.  Allergies  Allergen Reactions   Codeine     headache    Family History  Problem Relation Age of Onset   Dementia Mother    Rheum arthritis Father   Reviewed on admission  Prior to Admission medications   Medication Sig Start Date End Date Taking? Authorizing Provider  amLODipine (NORVASC) 10 MG tablet Take 1 tablet (10 mg total) by mouth daily. 06/22/21 06/22/22 Yes Conte, Tessa N, PA-C  atorvastatin (LIPITOR) 20 MG tablet Take 20 mg by mouth daily.   Yes [provider]  hydrALAZINE (APRESOLINE) 25 MG tablet Take 1 tablet (25 mg total) by mouth in the morning and at bedtime. Patient taking differently: Take 25 mg by mouth daily. 06/22/21 11/23/21 Yes Conte, Tessa N, PA-C  hydrOXYzine (ATARAX) 25 MG tablet Take 25 mg by mouth 3 (three) times daily as needed for itching.   Yes [provider]  pantoprazole (PROTONIX) 40 MG tablet Take 1 tablet (40 mg total) by mouth daily. 05/08/17  Yes Max Sane, MD  colchicine 0.6 MG tablet Take 0.6 mg by mouth daily as needed (Gout). 11/04/18   [provider]  meclizine (ANTIVERT) 25 MG tablet Take 25 mg by mouth 3 (three) times daily as needed for dizziness.    [provider]  SUMAtriptan (IMITREX) 50 MG tablet Take 50 mg by mouth every 2 (two) hours as needed for migraine.  09/27/18   [provider]  traMADol (ULTRAM) 50 MG tablet Take 1 tablet (50 mg total) by mouth every 6 (six) hours as needed. 06/23/21 06/23/22  Benjamine Sprague, DO    Physical Exam: Vitals:   11/23/21 1656 11/23/21 1658 11/23/21 1730  BP:  (!) 136/94 (!) 152/89  Pulse:  (!) 129 (!) 120  Resp:  20 (!) 25  Temp:  (!) 101.8 F (38.8 C)   TempSrc:  Oral   SpO2:  99% 96%  Weight: 69.4 kg    Height: '5\' 9"'$  (1.753 m)      Physical  Exam Constitutional:      General: He is not in acute distress.    Appearance: Normal appearance.  HENT:     Head: Normocephalic and atraumatic.     Mouth/Throat:     Mouth: Mucous membranes are moist.     Pharynx: Oropharynx is clear.  Eyes:     Extraocular Movements: Extraocular movements intact.     Pupils: Pupils are equal, round, and reactive to light.  Cardiovascular:     Rate and Rhythm: Regular rhythm. Tachycardia present.     Pulses: Normal pulses.     Heart sounds: Normal heart sounds.  Pulmonary:     Effort: Pulmonary effort is normal. No respiratory distress.     Breath sounds: Normal breath sounds.  Abdominal:     General: Bowel sounds are normal.  There is no distension.     Palpations: Abdomen is soft.     Tenderness: There is no abdominal tenderness.  Musculoskeletal:        General: No swelling or deformity.  Skin:    General: Skin is warm and dry.  Neurological:     General: No focal deficit present.     Mental Status: Mental status is at baseline.     Comments: Chronically pleasantly demented    Labs on Admission: I have personally reviewed following labs and imaging studies  CBC: No results for input(s): "WBC", "NEUTROABS", "HGB", "HCT", "MCV", "PLT" in the last 168 hours.  Basic Metabolic Panel: No results for input(s): "NA", "K", "CL", "CO2", "GLUCOSE", "BUN", "CREATININE", "CALCIUM", "MG", "PHOS" in the last 168 hours.  GFR: CrCl cannot be calculated (Patient's most recent lab result is older than the maximum 21 days allowed.).  Liver Function Tests: No results for input(s): "AST", "ALT", "ALKPHOS", "BILITOT", "PROT", "ALBUMIN" in the last 168 hours.  Urine analysis:    Component Value Date/Time   COLORURINE YELLOW (A) 05/15/2021 1250   APPEARANCEUR CLEAR (A) 05/15/2021 1250   LABSPEC 1.015 05/15/2021 1250   PHURINE 5.0 05/15/2021 1250   GLUCOSEU 50 (A) 05/15/2021 1250   HGBUR SMALL (A) 05/15/2021 1250   BILIRUBINUR NEGATIVE 05/15/2021  1250   KETONESUR NEGATIVE 05/15/2021 1250   PROTEINUR >=300 (A) 05/15/2021 1250   NITRITE NEGATIVE 05/15/2021 1250   LEUKOCYTESUR NEGATIVE 05/15/2021 1250    Radiological Exams on Admission: No results found.  EKG: Independently reviewed.  Sinus tachycardia 120 beats minute.  Significant baseline wander.  Assessment/Plan Principal Problem:   Admission for end of life care Active Problems:   CKD (chronic kidney disease), stage IV (HCC)   Essential hypertension   Alzheimer disease (HCC)   GERD (gastroesophageal reflux disease)   Hyperlipidemia LDL goal <70   Coronary artery disease involving native coronary artery of native heart without angina pectoris   Gout   Insomnia   Alzheimer dementia Sepsis/SIRS Admission for end-of-life care > Patient has had a steady decline recently in the setting of multiple comorbidities including Alzheimer's dementia.  Had been started on a trial of peritoneal dialysis and had his first session today but became weak and confused.  In the ED there was initial indications of sepsis, however after discussion with patient's wife by EDP decision was made to transition patient to comfort measures for end-of-life care.  Patient to be admitted for now to initiate these measures and to be evaluated by palliative care to help with arrangements for hospice. > In hospital death is possible. > I have placed a consult to palliative medicine. - Observe on palliative medicine/MedSurg unit - Orders placed for as needed medications for comfort: - As needed oxycodone for pain - As needed Ativan for anxiety - As needed Haldol for agitation - Continue home.  Imitrex for migraine - Continue home PPI  GERD - Continue home PPI  Migraine - Continue home as needed Imitrex  Hypertension - Continue home amlodipine - Holding home hydralazine  CKD 5 > No longer pursuing any peritoneal dialysis.  Lab not checked in the ED.  Comfort care as  above.  CAD Hyperlipidemia Gout Diastolic CHF BPH Insomnia - Noted, no medications for these  DVT prophylaxis: None, comfort care Code Status:   DNR Family Communication:  Discussed again with wife at bedside. Disposition Plan:   Patient is from:  Home  Anticipated DC to:  Home hospice versus residential hospice  Anticipated DC date:  Pending clinical course  Anticipated DC barriers: None  Consults called:  Consult placed to palliative medicine Admission status:  Observation, MedSurg/palliative  Severity of Illness: The appropriate patient status for this patient is OBSERVATION. Observation status is judged to be reasonable and necessary in order to provide the required intensity of service to ensure the patient's safety. The patient's presenting symptoms, physical exam findings, and initial radiographic and laboratory data in the context of their medical condition is felt to place them at decreased risk for further clinical deterioration. Furthermore, it is anticipated that the patient will be medically stable for discharge from the hospital within 2 midnights of admission.    Marcelyn Bruins MD [facility]  How to contact the [facility] Attending or Consulting provider [place] or covering provider during after hours [place], for this patient?   Check the care team in [facility] and look for a) attending/consulting TRH provider listed and b) the [facility] team listed Log into [email or web address] and use [place]'s universal password to access. If you do not have the password, please contact the hospital operator. Locate the [facility] provider you are looking for under [facility] and page to a number that you can be directly reached. If you still have difficulty reaching the provider, please page the [facility] (Director on Call) for the Hospitalists listed on amion for assistance.  11/23/2021, 6:18 PM

## 2021-11-24 ENCOUNTER — Other Ambulatory Visit: Payer: Medicare Other | Admitting: Student

## 2021-11-24 ENCOUNTER — Encounter: Payer: Medicare Other | Admitting: Surgery

## 2021-11-24 ENCOUNTER — Encounter: Payer: Self-pay | Admitting: Internal Medicine

## 2021-11-24 DIAGNOSIS — Z66 Do not resuscitate: Secondary | ICD-10-CM

## 2021-11-24 DIAGNOSIS — Z7189 Other specified counseling: Secondary | ICD-10-CM

## 2021-11-24 DIAGNOSIS — G309 Alzheimer's disease, unspecified: Secondary | ICD-10-CM | POA: Diagnosis not present

## 2021-11-24 DIAGNOSIS — I2510 Atherosclerotic heart disease of native coronary artery without angina pectoris: Secondary | ICD-10-CM | POA: Diagnosis not present

## 2021-11-24 DIAGNOSIS — Z515 Encounter for palliative care: Secondary | ICD-10-CM | POA: Diagnosis not present

## 2021-11-24 DIAGNOSIS — N184 Chronic kidney disease, stage 4 (severe): Secondary | ICD-10-CM | POA: Diagnosis not present

## 2021-11-24 LAB — PROCALCITONIN: Procalcitonin: 14.45 ng/mL

## 2021-11-24 MED ORDER — AMLODIPINE BESYLATE 10 MG PO TABS: 10.0000 mg | ORAL_TABLET | Freq: Every day | ORAL | Status: DC

## 2021-11-24 NOTE — Progress Notes (Signed)
[facility]) Hospital Liaison note:  This patient is currently enrolled in [facility] outpatient-based Palliative Care. Will continue to follow for disposition.  Please call with any outpatient palliative questions or concerns.  Thank you, Lorelee Market, LPN [facility] Liaison [phone]

## 2021-11-24 NOTE — TOC Initial Note (Signed)
Transition of Care [facility]) - Initial/Assessment Note    Patient Details  Name: Troy Chavez MRN: [id number] Date of Birth: 1945-09-05  Transition of [facility]) CM/SW Contact:    Candie Chroman, LCSW Phone Number: 11/24/2021, 10:55 AM  Clinical Narrative:   CSW met with patient. Wife and another male support at bedside. CSW introduced role and inquired about interest in hospice. Patient and wife confirmed. They prefer home hospice services. He is already followed by [facility] for outpatient palliative services. Gave home hospice referral to [facility]. No DME needs. Wife will take him home in the care. No further concerns. CSW encouraged patient to contact CSW as needed. CSW will continue to follow patient and his wife for support and facilitate return home when stable.               Expected Discharge Plan: Home w Hospice Care Barriers to Discharge: Continued Medical Work up   Patient Goals and CMS Choice        Expected Discharge Plan and Services Expected Discharge Plan: [place] Acute Care Choice: Hospice Living arrangements for the past 2 months: Single Family Home                                      Prior Living Arrangements/Services Living arrangements for the past 2 months: Single Family Home Lives with:: Spouse Patient language and need for interpreter reviewed:: Yes Do you feel safe going back to the place where you live?: Yes      Need for Family Participation in Patient Care: Yes (Comment) Care giver support system in place?: Yes (comment) Current home services: DME Criminal Activity/Legal Involvement Pertinent to Current Situation/Hospitalization: No - Comment as needed  Activities of Daily Living Home Assistive Devices/Equipment: Eyeglasses ADL Screening (condition at time of admission) Patient's cognitive ability adequate to safely complete daily activities?: No Is the patient deaf or have difficulty hearing?: Yes Does  the patient have difficulty seeing, even when wearing glasses/contacts?: No Does the patient have difficulty concentrating, remembering, or making decisions?: Yes Patient able to express need for assistance with ADLs?: Yes Does the patient have difficulty dressing or bathing?: Yes Independently performs ADLs?: No Communication: Needs assistance Is this a change from baseline?: Pre-admission baseline Dressing (OT): Needs assistance Is this a change from baseline?: Pre-admission baseline Grooming: Needs assistance Is this a change from baseline?: Pre-admission baseline Feeding: Needs assistance Is this a change from baseline?: Pre-admission baseline Bathing: Needs assistance Is this a change from baseline?: Pre-admission baseline Toileting: Needs assistance Is this a change from baseline?: Pre-admission baseline In/Out Bed: Needs assistance Is this a change from baseline?: Pre-admission baseline Walks in Home: Independent Does the patient have difficulty walking or climbing stairs?: Yes Weakness of Legs: Both Weakness of Arms/Hands: Both  Permission Sought/Granted Permission sought to share information with : Facility Sport and exercise psychologist, Family Supports Permission granted to share information with : Yes, Verbal Permission Granted  Share Information with NAME: Troy Chavez  Permission granted to share info w AGENCY: [place] granted to share info w Relationship: Spouse  Permission granted to share info w Contact Information: [phone]  Emotional Assessment Appearance:: Appears stated age Attitude/Demeanor/Rapport: Engaged, Gracious Affect (typically observed): Accepting, Appropriate, Calm, Pleasant Orientation: : Oriented to Self, Oriented to Place, Oriented to  Time, Oriented to Situation Alcohol / Substance Use: Not Applicable Psych Involvement:  No (comment)  Admission diagnosis:  Admission for end of life care [Z51.5] Goals of care, counseling/discussion  [Z71.89] Sepsis, due to unspecified organism, unspecified whether acute organ dysfunction present [facility]) [A41.9] Patient Active Problem List   Diagnosis Date Noted   Admission for end of life care 11/23/2021   Gout 10/25/2021   Insomnia 10/25/2021   Lower urinary tract symptoms 10/25/2021   Migraine 10/25/2021   DNR (do not resuscitate) 07/29/2021   Coronary artery disease involving native coronary artery of native heart without angina pectoris 06/09/2019   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   Multi-infarct dementia, without behavioral disturbance ([place]) 07/29/2018   History of ST elevation myocardial infarction (STEMI) 05/28/2017   Chest pain 05/06/2017   CKD (chronic kidney disease), stage IV ([place]) 05/06/2017   Essential hypertension 05/06/2017   Alzheimer disease ([place]) 05/06/2017   GERD (gastroesophageal reflux disease) 05/06/2017   Medicare annual wellness visit, initial 07/17/2016   Hyperlipidemia LDL goal <70 07/06/2014   OA (osteoarthritis) 10/01/2013   Proteinuria 10/01/2013   PCP:  Rusty Aus, MD Pharmacy:   [id number] [address], [address] [place] [address] Phone: [phone] Fax: [phone]     Social Determinants of Health (SDOH) Interventions    Readmission Risk Interventions     No data to display

## 2021-11-24 NOTE — Final Progress Note (Signed)
Discharge instructions reviewed with this patient and wife at the bedside. All questions and concerns were answered. PIV removed with no complications.

## 2021-11-24 NOTE — Discharge Summary (Addendum)
Physician Discharge Summary   Patient: Troy Chavez MRN: [id number] DOB: [birth year]  Admit date:     11/23/2021  Discharge date: 11/24/2021  Discharge Physician: Jennye Boroughs   PCP: Rusty Aus, MD   Recommendations at discharge:   Follow-up with hospice team at home within 24 hours of discharge  Discharge Diagnoses: Principal Problem:   Admission for end of life care Active Problems:   ESRD on peritoneal dialysis [place])   Essential hypertension   Alzheimer disease ([place])   GERD (gastroesophageal reflux disease)   Hyperlipidemia LDL goal <70   Coronary artery disease involving native coronary artery of native heart without angina pectoris   Gout   Insomnia  Resolved Problems:   * No resolved hospital problems. *  Hospital Course:  Ms. Troy Chavez is a 76 year old woman with medical history significant for ESRD who just started peritoneal dialysis, Alzheimer's dementia, hypertension, GERD, CAD s/p PCI, hyperlipidemia, gout, insomnia, migraine, chronic diastolic CHF, BPH, who presented to the hospital with generalized weakness and confusion.  He had just started peritoneal dialysis for the first time.  Unfortunately, he vomited and became very weak and confused about 2 hours of treatment of peritoneal dialysis.  He was therefore brought to the hospital for further evaluation.  Patient decided that he no longer wanted to pursue peritoneal dialysis and requested comfort measures with hospice.  Palliative care team and hospice team were consulted.  He is deemed to be a candidate for hospice services at home.  He wanted to have peritoneal dialysis catheter removed.  Dr. Christian Mate, general surgeon, who placed the PD catheter was contacted.  He spoke to the patient and his wife and recommended outpatient follow-up to consider removal of PD catheter if desired.  Discharge plan was discussed with the patient and his wife at the bedside.  Case was discussed with with Dr. Christian Mate,  general surgeon.  Dr. Christian Mate spoke to the patient and his wife and he will see them in the office to discuss removal of PD cath.        Consultants: Palliative care, hospice Procedures performed: None  Disposition:  Home with hospice Diet recommendation:  Discharge Diet Orders (From admission, onward)     Start     Ordered   11/24/21 0000  Diet regular        11/24/21 1151           Regular diet DISCHARGE MEDICATION: Allergies as of 11/24/2021       Reactions   Codeine    headache        Medication List     STOP taking these medications    amLODipine 10 MG tablet Commonly known as: NORVASC   atorvastatin 20 MG tablet Commonly known as: LIPITOR   hydrALAZINE 25 MG tablet Commonly known as: APRESOLINE       TAKE these medications    colchicine 0.6 MG tablet Take 0.6 mg by mouth daily as needed (Gout).   hydrOXYzine 25 MG tablet Commonly known as: ATARAX Take 25 mg by mouth 3 (three) times daily as needed for itching.   meclizine 25 MG tablet Commonly known as: ANTIVERT Take 25 mg by mouth 3 (three) times daily as needed for dizziness.   pantoprazole 40 MG tablet Commonly known as: PROTONIX Take 1 tablet (40 mg total) by mouth daily.   SUMAtriptan 50 MG tablet Commonly known as: IMITREX Take 50 mg by mouth every 2 (two) hours as needed for migraine.   traMADol 50  MG tablet Commonly known as: Ultram Take 1 tablet (50 mg total) by mouth every 6 (six) hours as needed.        Discharge Exam: Filed Weights   11/23/21 1656  Weight: 69.4 kg   GEN: NAD SKIN: Warm and dry EYES: EOMI ENT: MMM CV: RRR PULM: CTA B ABD: soft, ND, NT, +BS CNS: AAO x 2 (person and place), non focal EXT: No edema or tenderness   Condition at discharge: good  The results of significant diagnostics from this hospitalization (including imaging, microbiology, ancillary and laboratory) are listed below for reference.   Imaging Studies: No results  found.  Microbiology: Results for orders placed or performed during the hospital encounter of 02/03/19  Urine culture     Status: None   Collection Time: 02/03/19  8:09 PM   Specimen: Urine, Random  Result Value Ref Range Status   Specimen Description   Final    URINE, RANDOM Performed at [facility], [address] [place], [place] [address]    Special Requests   Final    NONE Performed at [facility], [address] [place], [place] [address]    Culture   Final    NO GROWTH Performed at [facility], [place] [address], [place], [place] [address]    Report Status 02/05/2019 FINAL  Final  Novel Coronavirus, NAA (Hosp order, Send-out to Ref Lab; TAT 18-24 hrs     Status: None   Collection Time: 02/03/19 10:15 PM   Specimen: Nasopharyngeal Swab; Respiratory  Result Value Ref Range Status   SARS-CoV-2, NAA NOT DETECTED NOT DETECTED Final    Comment: (NOTE) This nucleic acid amplification test was developed and its performance characteristics determined by [employer]. Nucleic acid amplification tests include PCR and TMA. This test has not been FDA cleared or approved. This test has been authorized by FDA under an Emergency Use Authorization (EUA). This test is only authorized for the duration of time the declaration that circumstances exist justifying the authorization of the emergency use of in vitro diagnostic tests for detection of SARS-CoV-2 virus and/or diagnosis of COVID-19 infection under section 564(b)(1) of the Act, 21 U.S.C. 287OMV-6(H) (1), unless the authorization is terminated or revoked sooner. When diagnostic testing is negative, the possibility of a false negative result should be considered in the context of a patient's recent exposures and the presence of clinical signs and symptoms consistent with COVID-19. An individual without symptoms of COVID- 19 and who is not shedding SARS-CoV-2 vi rus would expect to have a negative  (not detected) result in this assay. Performed At: [facility] [place], [place] [id number] Rush Farmer MD EZ:[id number]    [place]  Final    Comment: Performed at [facility], [address] [place], [place] [address]    Labs: CBC: No results for input(s): "WBC", "NEUTROABS", "HGB", "HCT", "MCV", "PLT" in the last 168 hours. Basic Metabolic Panel: No results for input(s): "NA", "K", "CL", "CO2", "GLUCOSE", "BUN", "CREATININE", "CALCIUM", "MG", "PHOS" in the last 168 hours. Liver Function Tests: No results for input(s): "AST", "ALT", "ALKPHOS", "BILITOT", "PROT", "ALBUMIN" in the last 168 hours. CBG: No results for input(s): "GLUCAP" in the last 168 hours.  Discharge time spent: greater than 30 minutes.  Signed: Jennye Boroughs, MD [facility] 11/25/2021

## 2021-11-24 NOTE — TOC Transition Note (Signed)
Transition of Care [facility]) - CM/SW Discharge Note   Patient Details  Name: Troy Chavez MRN: [id number] Date of Birth: 02/04/1946  Transition of Care [facility]) CM/SW Contact:  Candie Chroman, LCSW Phone Number: 11/24/2021, 12:25 PM   Clinical Narrative:   Patient has orders to discharge home with hospice today. No further concerns. CSW signing off.  Final next level of care: Home w Hospice Care Barriers to Discharge: No Barriers Identified   Patient Goals and CMS Choice     Choice offered to / list presented to : Patient, Spouse  Discharge Placement                Patient to be transferred to facility by: Wife Name of family member notified: Geoffrey Hynes Patient and family notified of of transfer: 11/24/21  Discharge Plan and Services     Post Acute Care Choice: Hospice                               Social Determinants of Health (SDOH) Interventions     Readmission Risk Interventions     No data to display

## 2021-11-24 NOTE — Consult Note (Signed)
Consultation Note Date: 11/24/2021   Patient Name: Troy Chavez  DOB: 02-08-1946  MRN: [id number]  Age / Sex: 76 y.o., male  PCP: Rusty Aus, MD Referring Physician: Jennye Boroughs, MD  Reason for Consultation: Establishing goals of care  HPI/Patient Profile: 76 y.o. male  with past medical history of CKD 5, Alzheimer's dementia, HTN, GERD, CAD s/p PCI, HLD, gout, insomnia, migraine, CHF, BPH, and recently started on peritoneal dialysis admitted on 11/23/2021 with weakness, confusion, and pain just after completing first session of PD.  EDP discussed with patient and wife patient's chronic illnesses and current condition with recent decline.  Decision was made to make patient focused on solely comfort care.  PMT was consulted to discuss goals of care and facilitate evaluation for hospice.  Clinical Assessment and Goals of Care: I have reviewed medical records including EPIC notes, labs and imaging, assessed the patient and then met with patient and his wife at bedside to discuss diagnosis prognosis, GOC, EOL wishes, disposition and options.  I introduced Palliative Medicine as specialized medical care for people living with serious illness. It focuses on providing relief from the symptoms and stress of a serious illness. The goal is to improve quality of life for both the patient and the family.  We discussed a brief life review of the patient.  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  He has 2 biological children and his wife has 2 biological children.  All live nearby, but wife shares that only 2 children are heavily involved in their life.  As far as functional and nutritional status PTA patient's wife endorses he was having confusion and weakness once PD was started.  She also reports he had excruciating abdominal pain with the first session of PD.   She endorses he has a healthy appetite, having just finished a biscuit from [place].  We discussed patient's current illness and what it means in the larger context of patient's on-going co-morbidities.  Natural disease trajectory and expectations at EOL were discussed.  Patient and wife understand that discontinuing peritoneal dialysis will likely result in end-of-life earlier.  Both are in agreement to focus on comfort and aging at home rather than treating the treatable and continue with dialysis.  Patient verbalized twice that he longer wants to continue with dialysis and wife is in agreement.  The difference between aggressive medical intervention and comfort care was considered in light of the patient's goals of care.  Hospice at home discussed in detail.  Patient's wife would like to have patient evaluated for hospice at home.  Symptom management:  Patient complained of abdominal pain is consistent and dull.  Patient has not had pain medication since yesterday afternoon.  Discussed use of Oxy IR for better pain management.  Patient and wife are in agreement.  Discussed with RN and she will give a dose now.  Questions and concerns were addressed. The family was encouraged to call with questions or concerns.  TOC and a [place] care liaison to work with patient in regards to discharge [place] with hospice.  Primary Decision Maker NEXT OF KIN  Code Status/Advance Care Planning: DNR  Prognosis:   < 6 months  Discharge Planning: Home with Hospice  Primary Diagnoses: Present on Admission:  CKD (chronic kidney disease), stage IV ([place])  Alzheimer disease ([place])  Essential hypertension  GERD (gastroesophageal reflux disease)  Hyperlipidemia LDL goal <70  Coronary artery disease involving native coronary artery of native heart without angina pectoris  Gout  Insomnia   Physical Exam Vitals reviewed.  Constitutional:      Appearance: Normal appearance. He is normal weight.  HENT:      Head: Normocephalic.     Mouth/Throat:     Mouth: Mucous membranes are moist.  Eyes:     Pupils: Pupils are equal, round, and reactive to light.  Cardiovascular:     Rate and Rhythm: Normal rate.     Pulses: Normal pulses.  Pulmonary:     Effort: Pulmonary effort is normal.  Abdominal:     Palpations: Abdomen is soft.  Musculoskeletal:        General: Normal range of motion.  Skin:    General: Skin is warm and dry.  Neurological:     Mental Status: He is alert. Mental status is at baseline.     Comments: Oriented to self and intermittently to situation  Psychiatric:        Mood and Affect: Mood normal.        Behavior: Behavior normal.        Thought Content: Thought content normal.        Judgment: Judgment normal.     Palliative Assessment/Data: 50%     I discussed this patient's plan of care with patient, patient's wife, RN, Dr. Mal Misty, SW Sarah, [place].  Thank you for this consult. Palliative medicine will continue to follow and assist holistically.   Time Total: 75 minutes Greater than 50%  of this time was spent counseling and coordinating care related to the above assessment and plan.  Signed by: Jordan Hawks, DNP, FNP-BC Palliative Medicine    Please contact Palliative Medicine Team phone at [phone] for questions and concerns.  For individual provider: See Shea Evans

## 2021-11-24 NOTE — Progress Notes (Signed)
[facility]) Hospital Liaison Note   Received request from Transitions of Care Manager, Judson Roch, for hospice services at home after discharge. Chart and patient information under review by [facility] physician. Hospice eligibility approved.   Spoke with wife/Becky & patient to initiate education related to hospice philosophy, services, and team approach to care. Both verbalized understanding of information given. Per discussion, the plan is for patient to discharge home via private vehicle once cleared to DC.    DME needs discussed. Patient denied any DME needs at this time.   Address verified and is correct in the chart.    Please send signed and completed DNR home with patient/family. Please provide prescriptions at discharge as needed to ensure ongoing symptom management.    [facility] information and contact numbers given to family & above information shared with TOC.   Please call with any questions/concerns.    Thank you for the opportunity to participate in this patient's care.   Daphene Calamity, MSW [facility] Liaison  [phone]

## 2021-12-08 ENCOUNTER — Ambulatory Visit: Payer: Medicare Other | Admitting: Cardiovascular Disease

## 2021-12-13 ENCOUNTER — Telehealth: Payer: Self-pay | Admitting: *Deleted

## 2021-12-13 ENCOUNTER — Ambulatory Visit: Payer: Medicare Other | Admitting: Surgery

## 2021-12-13 NOTE — Telephone Encounter (Signed)
Lorriane Shire (nurse) from [facility] called in regards to this patient she has a few questions that she wants to go over with a nurse, she didn't really want to go into detail. She would like a call back on [phone]

## 2021-12-13 NOTE — Telephone Encounter (Signed)
Patient in hospice care currently. PD catheter placed several weeks ago.  Patient is very agitated due to the tube. Patient's family is struggling with trying to keep him from pulling him out. Patient is weak and the nurse Lorriane Shire) stated they would have a difficult time . I spoke with Dr.Rodenberg and and made him aware of the conversation.

## 2021-12-15 ENCOUNTER — Encounter: Payer: Medicare Other | Admitting: Surgery

## 2022-01-06 DEATH — deceased
